# Patient Record
Sex: Female | Born: 1975
Health system: Southern US, Community
[De-identification: ages and names within clinical notes are randomized; demographics above are authoritative.]

## PROBLEM LIST (undated history)

## (undated) DIAGNOSIS — C801 Malignant (primary) neoplasm, unspecified: Secondary | ICD-10-CM

## (undated) DIAGNOSIS — I82409 Acute embolism and thrombosis of unspecified deep veins of unspecified lower extremity: Secondary | ICD-10-CM

## (undated) DIAGNOSIS — E119 Type 2 diabetes mellitus without complications: Secondary | ICD-10-CM

## (undated) DIAGNOSIS — M1990 Unspecified osteoarthritis, unspecified site: Secondary | ICD-10-CM

---

## 2014-01-14 DIAGNOSIS — G4733 Obstructive sleep apnea (adult) (pediatric): Secondary | ICD-10-CM | POA: Insufficient documentation

## 2014-01-14 DIAGNOSIS — F17200 Nicotine dependence, unspecified, uncomplicated: Secondary | ICD-10-CM | POA: Insufficient documentation

## 2014-01-14 DIAGNOSIS — F4310 Post-traumatic stress disorder, unspecified: Secondary | ICD-10-CM | POA: Insufficient documentation

## 2015-10-09 ENCOUNTER — Encounter (HOSPITAL_COMMUNITY): Payer: Self-pay

## 2015-10-09 ENCOUNTER — Emergency Department (HOSPITAL_COMMUNITY)
Admission: EM | Admit: 2015-10-09 | Discharge: 2015-10-10 | Disposition: A | Discharge status: Home / Self Care | Payer: Medicaid Other | Attending: Dermatology | Admitting: Dermatology

## 2015-10-09 DIAGNOSIS — E119 Type 2 diabetes mellitus without complications: Secondary | ICD-10-CM | POA: Insufficient documentation

## 2015-10-09 DIAGNOSIS — T407X5A Adverse effect of cannabis (derivatives), initial encounter: Secondary | ICD-10-CM | POA: Diagnosis not present

## 2015-10-09 DIAGNOSIS — F17200 Nicotine dependence, unspecified, uncomplicated: Secondary | ICD-10-CM | POA: Insufficient documentation

## 2015-10-09 DIAGNOSIS — Z5321 Procedure and treatment not carried out due to patient leaving prior to being seen by health care provider: Secondary | ICD-10-CM | POA: Diagnosis not present

## 2015-10-09 DIAGNOSIS — T407X1A Poisoning by cannabis (derivatives), accidental (unintentional), initial encounter: Secondary | ICD-10-CM | POA: Diagnosis present

## 2015-10-09 HISTORY — DX: Type 2 diabetes mellitus without complications: E11.9

## 2015-10-09 MED ORDER — ONDANSETRON 4 MG PO TBDP
4.0000 mg | ORAL_TABLET | Freq: Once | ORAL | Status: AC
  Administered 2015-10-09: 4 mg via ORAL
  Filled 2015-10-09: qty 1

## 2015-10-09 NOTE — ED Triage Notes (Signed)
Pt thinks she smoked some bad marijuana, she started shaking, vomiting and eyes were dilated, pt states that she felt really dizzy

## 2015-10-10 NOTE — ED Notes (Signed)
Pt not in the lobby again when ready for a room

## 2015-10-10 NOTE — ED Notes (Signed)
Pt not in the lobby when looking to place them in a room

## 2015-12-19 ENCOUNTER — Emergency Department (HOSPITAL_COMMUNITY)
Admission: EM | Admit: 2015-12-19 | Discharge: 2015-12-19 | Disposition: A | Discharge status: Home / Self Care | Payer: Medicaid Other | Attending: Emergency Medicine | Admitting: Emergency Medicine

## 2015-12-19 DIAGNOSIS — F17200 Nicotine dependence, unspecified, uncomplicated: Secondary | ICD-10-CM | POA: Insufficient documentation

## 2015-12-19 DIAGNOSIS — R739 Hyperglycemia, unspecified: Secondary | ICD-10-CM

## 2015-12-19 DIAGNOSIS — E1165 Type 2 diabetes mellitus with hyperglycemia: Secondary | ICD-10-CM | POA: Insufficient documentation

## 2015-12-19 DIAGNOSIS — Z7984 Long term (current) use of oral hypoglycemic drugs: Secondary | ICD-10-CM | POA: Insufficient documentation

## 2015-12-19 LAB — URINALYSIS, ROUTINE W REFLEX MICROSCOPIC
BILIRUBIN URINE: NEGATIVE
KETONES UR: NEGATIVE mg/dL
Leukocytes, UA: NEGATIVE
NITRITE: NEGATIVE
PH: 6 (ref 5.0–8.0)
Protein, ur: NEGATIVE mg/dL
Specific Gravity, Urine: 1.04 — ABNORMAL HIGH (ref 1.005–1.030)

## 2015-12-19 LAB — CBC WITH DIFFERENTIAL/PLATELET
BASOS ABS: 0 10*3/uL (ref 0.0–0.1)
Basophils Relative: 0 %
Eosinophils Absolute: 0.1 10*3/uL (ref 0.0–0.7)
Eosinophils Relative: 1 %
HEMATOCRIT: 34.8 % — AB (ref 36.0–46.0)
Hemoglobin: 12.1 g/dL (ref 12.0–15.0)
LYMPHS ABS: 3.9 10*3/uL (ref 0.7–4.0)
LYMPHS PCT: 37 %
MCH: 33.2 pg (ref 26.0–34.0)
MCHC: 34.8 g/dL (ref 30.0–36.0)
MCV: 95.6 fL (ref 78.0–100.0)
Monocytes Absolute: 0.7 10*3/uL (ref 0.1–1.0)
Monocytes Relative: 6 %
NEUTROS ABS: 6 10*3/uL (ref 1.7–7.7)
Neutrophils Relative %: 56 %
Platelets: 284 10*3/uL (ref 150–400)
RBC: 3.64 MIL/uL — AB (ref 3.87–5.11)
RDW: 12.3 % (ref 11.5–15.5)
WBC: 10.7 10*3/uL — AB (ref 4.0–10.5)

## 2015-12-19 LAB — COMPREHENSIVE METABOLIC PANEL
ALK PHOS: 39 U/L (ref 38–126)
ALT: 20 U/L (ref 14–54)
AST: 28 U/L (ref 15–41)
Albumin: 3.8 g/dL (ref 3.5–5.0)
Anion gap: 10 (ref 5–15)
BILIRUBIN TOTAL: 0.6 mg/dL (ref 0.3–1.2)
BUN: 9 mg/dL (ref 6–20)
CALCIUM: 8.9 mg/dL (ref 8.9–10.3)
CO2: 22 mmol/L (ref 22–32)
CREATININE: 0.58 mg/dL (ref 0.44–1.00)
Chloride: 102 mmol/L (ref 101–111)
GFR calc non Af Amer: >60 mL/min (ref >60)
Glucose, Bld: 414 mg/dL — ABNORMAL HIGH (ref 65–99)
Potassium: 3.4 mmol/L — ABNORMAL LOW (ref 3.5–5.1)
SODIUM: 134 mmol/L — AB (ref 135–145)
TOTAL PROTEIN: 6.9 g/dL (ref 6.5–8.1)

## 2015-12-19 LAB — BLOOD GAS, VENOUS
ACID-BASE DEFICIT: 0.2 mmol/L (ref 0.0–2.0)
BICARBONATE: 23.2 mmol/L (ref 20.0–28.0)
Drawn by: 280161
FIO2: 21
O2 Saturation: 98.8 %
PCO2 VEN: 35.8 mmHg — AB (ref 44.0–60.0)
PH VEN: 7.428 (ref 7.250–7.430)
PO2 VEN: 121 mmHg — AB (ref 32.0–45.0)
Patient temperature: 98.7

## 2015-12-19 LAB — I-STAT CHEM 8, ED
BUN: 7 mg/dL (ref 6–20)
CHLORIDE: 98 mmol/L — AB (ref 101–111)
CREATININE: 0.5 mg/dL (ref 0.44–1.00)
Calcium, Ion: 1.13 mmol/L — ABNORMAL LOW (ref 1.15–1.40)
GLUCOSE: 424 mg/dL — AB (ref 65–99)
HCT: 38 % (ref 36.0–46.0)
Hemoglobin: 12.9 g/dL (ref 12.0–15.0)
POTASSIUM: 3.4 mmol/L — AB (ref 3.5–5.1)
Sodium: 137 mmol/L (ref 135–145)
TCO2: 23 mmol/L (ref 0–100)

## 2015-12-19 LAB — CBG MONITORING, ED
GLUCOSE-CAPILLARY: 368 mg/dL — AB (ref 65–99)
GLUCOSE-CAPILLARY: 403 mg/dL — AB (ref 65–99)
GLUCOSE-CAPILLARY: 284 mg/dL — AB (ref 65–99)
Glucose-Capillary: 279 mg/dL — ABNORMAL HIGH (ref 65–99)

## 2015-12-19 LAB — URINE MICROSCOPIC-ADD ON
BACTERIA UA: NONE SEEN
WBC, UA: NONE SEEN WBC/hpf (ref 0–5)

## 2015-12-19 LAB — PREGNANCY, URINE: Preg Test, Ur: NEGATIVE

## 2015-12-19 MED ORDER — SODIUM CHLORIDE 0.9 % IV BOLUS (SEPSIS)
1000.0000 mL | Freq: Once | INTRAVENOUS | Status: AC
  Administered 2015-12-19: 1000 mL via INTRAVENOUS

## 2015-12-19 MED ORDER — KETOROLAC TROMETHAMINE 30 MG/ML IJ SOLN
INTRAMUSCULAR | Status: AC
Start: 2015-12-19 — End: 2015-12-19
  Administered 2015-12-19: 30 mg via INTRAVENOUS
  Filled 2015-12-19: qty 1

## 2015-12-19 MED ORDER — KETOROLAC TROMETHAMINE 30 MG/ML IJ SOLN
30.0000 mg | Freq: Once | INTRAMUSCULAR | Status: AC
  Administered 2015-12-19: 30 mg via INTRAVENOUS

## 2015-12-19 MED ORDER — INSULIN ASPART 100 UNIT/ML ~~LOC~~ SOLN
5.0000 [IU] | Freq: Once | SUBCUTANEOUS | Status: AC
  Administered 2015-12-19: 5 [IU] via INTRAVENOUS
  Filled 2015-12-19: qty 1

## 2015-12-19 MED ORDER — METFORMIN HCL 500 MG PO TABS: 1000.0000 mg | ORAL_TABLET | Freq: Two times a day (BID) | ORAL | 0 refills | Status: DC

## 2015-12-19 NOTE — Discharge Instructions (Signed)
Increase your dose of metformin from 500 mg twice a day to 1000 mg twice a day. Follow-up with your primary care doctor regarding your visit to the emergency department. Your primary doctor can recheck your sugar and hemoglobin A1c at this time. Be sure to drink plenty of water to prevent dehydration. Try to modify your diet and decrease the amount of fast foods or processed foods that you eat. Take Tylenol or ibuprofen for headache.

## 2015-12-19 NOTE — ED Provider Notes (Signed)
[facility] DEPT Provider Note   CSN: [id number] Arrival date & time: 12/19/15  0113  By signing my name below, I, Hansel Feinstein, attest that this documentation has been prepared under the direction and in the presence of [employer], PA-C. Electronically Signed: Hansel Feinstein, ED Scribe. 12/19/15. 1:37 AM.    History   Chief Complaint Chief Complaint  Patient presents with  . Hyperglycemia    HPI Queena Monrreal is a 40 y.o. female with h/o DM on Metformin brought in by ambulance who presents to the Emergency Department complaining of moderate HA onset at 11pm last night with associated "room-spinning" dizziness, difficulty breathing and "shakiness. Pt states she found her CBG to be 347 with onset of her symptoms. EMS reports CBG on their arrival was 460. Pt states her CBGs run in the 100s-120s at baseline. Pt also reports recent polydipsia, polyuria and increased thirst. She is not sure what her last A1C was. She reports that she has recently been under increased stress at home. Pt is currently followed by a PCP. She denies fever, cough, congestion, rhinorrhea.   The history is provided by the patient and the EMS personnel. No language interpreter was used.    Past Medical History:  Diagnosis Date  . Diabetes mellitus without complication ([place])     There are no active problems to display for this patient.   No past surgical history on file.  OB History    No data available       Home Medications    Prior to Admission medications   Medication Sig Start Date End Date Taking? Authorizing Provider  Cyanocobalamin (VITAMIN B 12 PO) Take 1 tablet by mouth daily.   Yes Historical Provider, MD  gabapentin (NEURONTIN) 100 MG capsule Take 100 mg by mouth 3 (three) times daily. 11/08/15 11/07/16 Yes Historical Provider, MD  lisinopril (PRINIVIL,ZESTRIL) 2.5 MG tablet Take 2.5 mg by mouth daily.   Yes Historical Provider, MD  loratadine (CLARITIN) 10 MG tablet Take 10 mg by mouth daily  as needed for allergies.   Yes Historical Provider, MD  Multiple Vitamin (MULTIVITAMIN WITH MINERALS) TABS tablet Take 1 tablet by mouth daily.   Yes Historical Provider, MD  metFORMIN (GLUCOPHAGE) 500 MG tablet Take 2 tablets (1,000 mg total) by mouth 2 (two) times daily. 12/19/15   Antonietta Breach, PA-C    Family History No family history on file.  Social History Social History  Substance Use Topics  . Smoking status: Current Every Day Smoker  . Smokeless tobacco: Never Used  . Alcohol use Yes     Allergies   Patient has no known allergies.   Review of Systems Review of Systems  Constitutional: Negative for fever.  HENT: Negative for congestion and rhinorrhea.   Respiratory: Negative for cough.   Endocrine: Positive for polydipsia and polyuria.       +hyperglycemia   Neurological: Positive for dizziness and headaches.  All other systems reviewed and are negative.    Physical Exam Updated Vital Signs BP 104/63 (BP Location: Right Arm)   Pulse 75   Temp 98.7 F (37.1 C) (Oral)   Resp 18   Ht 5' 2"  (1.575 m)   Wt 97.5 kg   LMP 12/18/2015   SpO2 99%   BMI 39.32 kg/m   Physical Exam  Constitutional: She is oriented to person, place, and time. She appears well-developed and well-nourished. No distress.  Nontoxic and in NAD  HENT:  Head: Normocephalic and atraumatic.  Mildly dry mm  Eyes: Conjunctivae and EOM are normal. No scleral icterus.  Neck: Normal range of motion.  Cardiovascular: Normal rate, regular rhythm and intact distal pulses.   Pulmonary/Chest: Effort normal. No respiratory distress. She has no wheezes. She has no rales.  Lungs CTAB  Abdominal: Soft. She exhibits no distension. There is no tenderness. There is no guarding.  Soft, obese, nontender abdomen.  Musculoskeletal: Normal range of motion.  Neurological: She is alert and oriented to person, place, and time. No cranial nerve deficit. She exhibits normal muscle tone. Coordination normal.  GCS  15. No focal neurologic deficits appreciated. Patient moves extremities without ataxia.  Skin: Skin is warm and dry. No rash noted. She is not diaphoretic. No erythema. No pallor.  Psychiatric: She has a normal mood and affect. Her behavior is normal.  Nursing note and vitals reviewed.    ED Treatments / Results   DIAGNOSTIC STUDIES: Oxygen Saturation is 97% on RA, normal by my interpretation.    COORDINATION OF CARE: 1:35 AM Discussed treatment plan with pt at bedside which includes lab work and pt agreed to plan.    Labs (all labs ordered are listed, but only abnormal results are displayed) Labs Reviewed  CBC WITH DIFFERENTIAL/PLATELET - Abnormal; Notable for the following:       Result Value   WBC 10.7 (*)    RBC 3.64 (*)    HCT 34.8 (*)    All other components within normal limits  BLOOD GAS, VENOUS - Abnormal; Notable for the following:    pCO2, Ven 35.8 (*)    pO2, Ven 121.0 (*)    All other components within normal limits  COMPREHENSIVE METABOLIC PANEL - Abnormal; Notable for the following:    Sodium 134 (*)    Potassium 3.4 (*)    Glucose, Bld 414 (*)    All other components within normal limits  URINALYSIS, ROUTINE W REFLEX MICROSCOPIC (NOT AT [facility]) - Abnormal; Notable for the following:    Specific Gravity, Urine 1.040 (*)    Glucose, UA >1000 (*)    Hgb urine dipstick MODERATE (*)    All other components within normal limits  URINE MICROSCOPIC-ADD ON - Abnormal; Notable for the following:    Squamous Epithelial / LPF 0-5 (*)    All other components within normal limits  CBG MONITORING, ED - Abnormal; Notable for the following:    Glucose-Capillary 368 (*)    All other components within normal limits  I-STAT CHEM 8, ED - Abnormal; Notable for the following:    Potassium 3.4 (*)    Chloride 98 (*)    Glucose, Bld 424 (*)    Calcium, Ion 1.13 (*)    All other components within normal limits  CBG MONITORING, ED - Abnormal; Notable for the following:     Glucose-Capillary 403 (*)    All other components within normal limits  CBG MONITORING, ED - Abnormal; Notable for the following:    Glucose-Capillary 279 (*)    All other components within normal limits  CBG MONITORING, ED - Abnormal; Notable for the following:    Glucose-Capillary 284 (*)    All other components within normal limits  PREGNANCY, URINE    EKG  EKG Interpretation None       Radiology No results found.  Procedures Procedures (including critical care time)  Medications Ordered in ED Medications  sodium chloride 0.9 % bolus 1,000 mL (0 mLs Intravenous Stopped 12/19/15 0230)  sodium chloride 0.9 % bolus 1,000 mL (1,000  mLs Intravenous New Bag/Given 12/19/15 0314)  insulin aspart (novoLOG) injection 5 Units (5 Units Intravenous Given 12/19/15 0317)  ketorolac (TORADOL) 30 MG/ML injection 30 mg (30 mg Intravenous Given 12/19/15 0313)     Initial Impression / Assessment and Plan / ED Course  I have reviewed the triage vital signs and the nursing notes.  Pertinent labs & imaging results that were available during my care of the patient were reviewed by me and considered in my medical decision making (see chart for details).  Clinical Course     3:35 AM Patient reassessed; texting on cell phone and in no distress or visible/audible discomfort. Patient states that she is feeling better. Will continue to hydrate to improve blood sugar. Labs without evidence of DKA.  4:56 AM Patient with improved blood sugar with insulin. Patient continues to endorse no complaints. She states that she feels back to normal. She is comfortable with discharge. Will increase metformin to 1054m BID. Patient advised to follow-up with her primary care doctor. Return precautions given. Patient discharged in stable condition with no unaddressed concerns.   Final Clinical Impressions(s) / ED Diagnoses   Final diagnoses:  Hyperglycemia    New Prescriptions Current Discharge Medication  List      I personally performed the services described in this documentation, which was scribed in my presence. The recorded information has been reviewed and is accurate.      KAntonietta Breach PA-C 12/19/15 [id number]   April Palumbo, MD 12/19/15 [phone]

## 2015-12-19 NOTE — ED Notes (Signed)
Bed: TR32 Expected date:  Expected time:  Means of arrival:  Comments: hyperglycemic

## 2015-12-19 NOTE — ED Triage Notes (Signed)
Per [facility]: pt reports hyperglycemia with headaches and jitteriness. Pt reports being under a lot of stress lately. Glucose 460. 18g left AC Pt has had 253cc Ns. 134/79 hr 84 94% room air. Pain 8/10. A&O.

## 2016-02-13 DIAGNOSIS — F410 Panic disorder [episodic paroxysmal anxiety] without agoraphobia: Secondary | ICD-10-CM | POA: Insufficient documentation

## 2016-02-13 DIAGNOSIS — F430 Acute stress reaction: Secondary | ICD-10-CM | POA: Insufficient documentation

## 2016-02-20 ENCOUNTER — Ambulatory Visit (INDEPENDENT_AMBULATORY_CARE_PROVIDER_SITE_OTHER): Payer: Medicaid Other | Admitting: Pulmonary Disease

## 2016-02-20 ENCOUNTER — Encounter: Payer: Self-pay | Admitting: Pulmonary Disease

## 2016-02-20 VITALS — BP 108/72 | HR 78 | Ht 62.0 in | Wt 242.2 lb

## 2016-02-20 DIAGNOSIS — G4710 Hypersomnia, unspecified: Secondary | ICD-10-CM

## 2016-02-20 DIAGNOSIS — G4733 Obstructive sleep apnea (adult) (pediatric): Secondary | ICD-10-CM

## 2016-02-20 DIAGNOSIS — Z720 Tobacco use: Secondary | ICD-10-CM | POA: Diagnosis not present

## 2016-02-20 NOTE — Assessment & Plan Note (Signed)
Patient was diagnosed with OSA in 2013.  She had snoring, gasping, choking, witnessed apneas, unrefreshed sleep, fatigue. She had a lab study, and was diagnosed with severe OSA. She is on her 3rd machine.  She lost her 1st machine, 2nd machine got broken. She got it from [place] at [facility]. CPAP is set at 10 cm water.   She feels better with cpap. More enery, less sleepiness. Less snoring. (-) witnessed apneas. (-) naps.  She is happy with cpap.   She moved to [place] 4 mos ago. No supplies x 6 months now. She has nasal pillows.   She is not on O2.   She is originally from [place], moved to [place] then [place] recently.   Plan :  We discussed about the diagnosis of Obstructive Sleep Apnea (OSA) and implications of untreated OSA. We discussed about CPAP and BiPaP as possible treatment options.    We will schedule the patient for a sleep study. Plan for a split-night sleep study. She claims she has been compliant. She is on her third machine. The third machine she got 6 months ago. She was using [place] from [place] and [employer]. We called [facility] office and they said that she was inactive. We tried calling [facility] office but unable to get in touch with. I discussed with the patient and told her we needed to start from scratch. Plan for a split-night sleep study. He is currently on 10 cm water. Likely with severe sleep apnea. Anticipate no issues with CPAP.   Patient was instructed to call the office if he/she has not heard back from the office 1-2 weeks after the sleep study.   Patient was instructed to call the office if he/she is having issues with the PAP device.   We discussed good sleep hygiene.   Patient was advised not to engage in activities requiring concentration and/or vigilance if he/she is sleepy.  Patient was advised not to drive if he/she is sleepy.

## 2016-02-20 NOTE — Assessment & Plan Note (Signed)
Smoking cessation  

## 2016-02-20 NOTE — Patient Instructions (Signed)
It was a pleasure taking care of you today!  We will schedule you to have a sleep study to determine if you have sleep apnea.     We will get a lab sleep study.  You will be scheduled to have a lab sleep study in 4-6 weeks.  Someone from the sleep lab will call you in 2-3 days to schedule the study with you.  They usually have cancellations every night so most likely, they will have openings for a lab sleep study next week or so.  We encourage you to do your sleep study then if possible. Please give us a call in a week is no one from the sleep lab calls you in 2-3 days.   If the sleep study is positive, we will order you a CPAP  machine.  Please call the office if you do NOT receive your machine in the next 1-2 weeks.   Please make sure you use your CPAP device everytime you sleep.  We will monitor the usage of your machine per your insurance requirement.  Your insurance company may take the machine from you if you are not using it regularly.   Please clean the mask, tubings, filter, water reservoir with soapy water every week.  Please use distilled water for the water reservoir.   Please call the office or your machine provider (DME company) if you are having issues with the device.   Return to clinic in 8-10 weeks with Dr. De Dios or NP    

## 2016-02-20 NOTE — Progress Notes (Signed)
Subjective:    Patient ID: Megan Riddle, female    DOB: 06/03/1975, 41 y.o.   MRN: [id number]  HPI   This is the case of Megan Riddle, 41 y.o. Female, who made this referral  regarding her OSA.    As you very well know, patient 15 PY smoking history, smokes 1/2 PPD, not been diagnosed with asthma or copd.   Patient was diagnosed with OSA in 2013.  She had snoring, gasping, choking, witnessed apneas, unrefreshed sleep, fatigue. She had a lab study, and was diagnosed with severe OSA. She is on her 3rd machine.  She lost her 1st machine, 2nd machine got broken. She got it from [place] at [facility]. CPAP is set at 10 cm water.   She feels better with cpap. More enery, less sleepiness. Less snoring. (-) witnessed apneas. (-) naps.  She is happy with cpap.   She moved to [place] 4 mos ago. No supplies x 6 months now. She has nasal pillows.   She is not on O2.   She is originally from [place], moved to [place] then [place] recently.   Review of Systems  Constitutional: Negative.  Negative for fever and unexpected weight change.  HENT: Negative for congestion, dental problem, ear pain, nosebleeds, postnasal drip, rhinorrhea, sinus pressure, sneezing, sore throat and trouble swallowing.   Eyes: Negative.  Negative for redness and itching.  Respiratory: Negative.  Negative for cough, chest tightness, shortness of breath and wheezing.   Cardiovascular: Negative.  Negative for palpitations and leg swelling.  Gastrointestinal: Negative.  Negative for nausea and vomiting.  Endocrine: Negative.   Genitourinary: Negative.  Negative for dysuria.  Musculoskeletal: Positive for joint swelling.  Skin: Negative.  Negative for rash.  Allergic/Immunologic: Positive for environmental allergies.  Neurological: Negative.  Negative for headaches.  Hematological: Negative.  Does not bruise/bleed easily.  Psychiatric/Behavioral: Negative.  Negative for dysphoric mood. The patient is not nervous/anxious.    Past  Medical History:  Diagnosis Date  . Diabetes mellitus without complication ([facility])    (-) CA, DVT (-) CVA, CAD  No family history on file.  Mother is deceased and had CAD.  Father has hepatitis C and DM  No past surgical history on file.  Social History   Social History  . Marital status: Married    Spouse name: N/A  . Number of children: N/A  . Years of education: N/A   Occupational History  . Not on file.   Social History Main Topics  . Smoking status: Current Every Day Smoker  . Smokeless tobacco: Never Used  . Alcohol use Yes  . Drug use: Yes    Types: Marijuana  . Sexual activity: Not on file   Other Topics Concern  . Not on file   Social History Narrative  . No narrative on file   Married, with 1 son. Does housekeeping at [employer]. Occasional ETOH.   No Known Allergies   Outpatient Medications Prior to Visit  Medication Sig Dispense Refill  . Cyanocobalamin (VITAMIN B 12 PO) Take 1 tablet by mouth daily.    Marland Kitchen gabapentin (NEURONTIN) 100 MG capsule Take 100 mg by mouth 3 (three) times daily.    Marland Kitchen lisinopril (PRINIVIL,ZESTRIL) 2.5 MG tablet Take 2.5 mg by mouth daily.    . metFORMIN (GLUCOPHAGE) 500 MG tablet Take 2 tablets (1,000 mg total) by mouth 2 (two) times daily. 60 tablet 0  . Multiple Vitamin (MULTIVITAMIN WITH MINERALS) TABS tablet Take 1 tablet by mouth daily.    Marland Kitchen  loratadine (CLARITIN) 10 MG tablet Take 10 mg by mouth daily as needed for allergies.     No facility-administered medications prior to visit.    No orders of the defined types were placed in this encounter.        Objective:   Physical Exam  Vitals:  Vitals:   02/20/16 1518  BP: 108/72  Pulse: 78  Weight: 242 lb 3.2 oz (109.9 kg)  Height: 5' 2"  (1.575 m)    Constitutional/General:  Pleasant, well-nourished, well-developed, not in any distress,  Comfortably seating.  Well kempt  Body mass index is 44.3 kg/m. Wt Readings from Last 3 Encounters:  02/20/16 242 lb 3.2 oz  (109.9 kg)  12/19/15 215 lb (97.5 kg)      HEENT: Pupils equal and reactive to light and accommodation. Anicteric sclerae. Normal nasal mucosa.   No oral  lesions,  mouth clear,  oropharynx clear, no postnasal drip. (-) Oral thrush. No dental caries.  Airway - Mallampati class IV  Neck: No masses. Midline trachea. No JVD, (-) LAD. (-) bruits appreciated.  Respiratory/Chest: Grossly normal chest. (-) deformity. (-) Accessory muscle use.  Symmetric expansion. (-) Tenderness on palpation.  Resonant on percussion.  Diminished BS on both lower lung zones. (-) wheezing, crackles, rhonchi (-) egophony  Cardiovascular: Regular rate and  rhythm, heart sounds normal, no murmur or gallops, no peripheral edema  Gastrointestinal:  Normal bowel sounds. Soft, non-tender. No hepatosplenomegaly.  (-) masses.   Musculoskeletal:  Normal muscle tone. Normal gait.   Extremities: Grossly normal. (-) clubbing, cyanosis.  (-) edema  Skin: (-) rash,lesions seen.   Neurological/Psychiatric : alert, oriented to time, place, person. Normal mood and affect          Assessment & Plan:  OSA (obstructive sleep apnea) Patient was diagnosed with OSA in 2013.  She had snoring, gasping, choking, witnessed apneas, unrefreshed sleep, fatigue. She had a lab study, and was diagnosed with severe OSA. She is on her 3rd machine.  She lost her 1st machine, 2nd machine got broken. She got it from [place] at [facility]. CPAP is set at 10 cm water.   She feels better with cpap. More enery, less sleepiness. Less snoring. (-) witnessed apneas. (-) naps.  She is happy with cpap.   She moved to [place] 4 mos ago. No supplies x 6 months now. She has nasal pillows.   She is not on O2.   She is originally from [place], moved to [place] then [place] recently.   Plan :  We discussed about the diagnosis of Obstructive Sleep Apnea (OSA) and implications of untreated OSA. We discussed about CPAP and BiPaP as possible  treatment options.    We will schedule the patient for a sleep study. Plan for a split-night sleep study. She claims she has been compliant. She is on her third machine. The third machine she got 6 months ago. She was using [place] from [place] and [employer]. We called [facility] office and they said that she was inactive. We tried calling [facility] office but unable to get in touch with. I discussed with the patient and told her we needed to start from scratch. Plan for a split-night sleep study. He is currently on 10 cm water. Likely with severe sleep apnea. Anticipate no issues with CPAP.   Patient was instructed to call the office if he/she has not heard back from the office 1-2 weeks after the sleep study.   Patient was instructed to  call the office if he/she is having issues with the PAP device.   We discussed good sleep hygiene.   Patient was advised not to engage in activities requiring concentration and/or vigilance if he/she is sleepy.  Patient was advised not to drive if he/she is sleepy.    Tobacco user Smoking cessation     Thank you very much for letting me participate in this patient's care. Please do not hesitate to give me a call if you have any questions or concerns regarding the treatment plan.   Patient will follow up with me in 8-10 weeks.     Monica Becton, MD 02/20/2016   3:46 PM Pulmonary and [place] Pager: [phone] Office: [phone], Fax: [phone]

## 2016-02-29 ENCOUNTER — Ambulatory Visit (HOSPITAL_BASED_OUTPATIENT_CLINIC_OR_DEPARTMENT_OTHER): Payer: Medicaid Other | Attending: Pulmonary Disease

## 2016-04-08 ENCOUNTER — Encounter (HOSPITAL_BASED_OUTPATIENT_CLINIC_OR_DEPARTMENT_OTHER): Payer: Medicaid Other

## 2016-04-16 ENCOUNTER — Ambulatory Visit: Payer: Medicaid Other | Admitting: Pulmonary Disease

## 2016-05-14 ENCOUNTER — Ambulatory Visit (HOSPITAL_COMMUNITY)
Admission: EM | Admit: 2016-05-14 | Discharge: 2016-05-14 | Disposition: A | Discharge status: Home / Self Care | Payer: Medicaid Other | Attending: Internal Medicine | Admitting: Internal Medicine

## 2016-05-14 ENCOUNTER — Encounter (HOSPITAL_COMMUNITY): Payer: Self-pay | Admitting: Emergency Medicine

## 2016-05-14 ENCOUNTER — Ambulatory Visit (INDEPENDENT_AMBULATORY_CARE_PROVIDER_SITE_OTHER): Payer: Medicaid Other

## 2016-05-14 DIAGNOSIS — S20222A Contusion of left back wall of thorax, initial encounter: Secondary | ICD-10-CM

## 2016-05-14 MED ORDER — DICLOFENAC SODIUM 75 MG PO TBEC: 75.0000 mg | DELAYED_RELEASE_TABLET | Freq: Two times a day (BID) | ORAL | 0 refills | Status: DC

## 2016-05-14 MED ORDER — METHOCARBAMOL 500 MG PO TABS: 500.0000 mg | ORAL_TABLET | Freq: Two times a day (BID) | ORAL | 0 refills | Status: DC

## 2016-05-14 NOTE — ED Triage Notes (Signed)
Patient fell from a 3 step ladder, fell backwards, patient was hanging curtains.  Patient has more pain on left back and breathing is particularly painful.

## 2016-05-14 NOTE — Discharge Instructions (Signed)
See your Physician for recheck in 1 week if pain persist °

## 2016-05-14 NOTE — ED Provider Notes (Signed)
CSN: [id number]     Arrival date & time 05/14/16  1417 History   None    Chief Complaint  Patient presents with  . Fall   (Consider location/radiation/quality/duration/timing/severity/associated sxs/prior Treatment) The history is provided by the patient. No language interpreter was used.  Fall  This is a new problem. The problem occurs constantly. The problem has been gradually worsening. Pertinent negatives include no shortness of breath. Nothing aggravates the symptoms. Nothing relieves the symptoms. She has tried nothing for the symptoms. The treatment provided no relief.   Pt reports she was hanging curtains and fell off of a 3 step ladder.  Pt hit the back of her left ribs. Past Medical History:  Diagnosis Date  . Diabetes mellitus without complication ([place])    History reviewed. No pertinent surgical history. No family history on file. Social History  Substance Use Topics  . Smoking status: Current Every Day Smoker  . Smokeless tobacco: Never Used  . Alcohol use Yes   OB History    No data available     Review of Systems  Respiratory: Negative for shortness of breath.   All other systems reviewed and are negative.   Allergies  Patient has no known allergies.  Home Medications   Prior to Admission medications   Medication Sig Start Date End Date Taking? Authorizing Provider  Cyanocobalamin (VITAMIN B 12 PO) Take 1 tablet by mouth daily.    Historical Provider, MD  gabapentin (NEURONTIN) 100 MG capsule Take 100 mg by mouth 3 (three) times daily. 11/08/15 11/07/16  Historical Provider, MD  lisinopril (PRINIVIL,ZESTRIL) 2.5 MG tablet Take 2.5 mg by mouth daily.    Historical Provider, MD  metFORMIN (GLUCOPHAGE) 500 MG tablet Take 2 tablets (1,000 mg total) by mouth 2 (two) times daily. 12/19/15   Antonietta Breach, PA-C  Multiple Vitamin (MULTIVITAMIN WITH MINERALS) TABS tablet Take 1 tablet by mouth daily.    Historical Provider, MD   Meds Ordered and Administered this  Visit  Medications - No data to display  BP (!) 108/59 (BP Location: Right Arm) Comment: notified rn  Pulse 81   Temp 98.6 F (37 C) (Oral)   Resp 20   LMP 05/14/2016   SpO2 99%  No data found.   Physical Exam  Constitutional: She appears well-developed and well-nourished.  HENT:  Head: Normocephalic.  Eyes: EOM are normal. Pupils are equal, round, and reactive to light.  Cardiovascular: Normal rate.   Pulmonary/Chest: Effort normal.  Tender left posterior ribs,  Pain with movement  Abdominal: Soft.  Neurological: She is alert.  Skin: Skin is warm.  Psychiatric: She has a normal mood and affect.  Nursing note and vitals reviewed.   Urgent Care Course     Procedures (including critical care time)  Labs Review Labs Reviewed - No data to display  Imaging Review Dg Chest 2 View  Result Date: 05/14/2016 CLINICAL DATA:  Left upper chest pain after fall. EXAM: CHEST  2 VIEW COMPARISON:  None. FINDINGS: The heart size and mediastinal contours are within normal limits. Both lungs are clear. The visualized skeletal structures are unremarkable. IMPRESSION: No active cardiopulmonary disease. Electronically Signed   By: Kerby Moors M.D.   On: 05/14/2016 16:06     Visual Acuity Review  Right Eye Distance:   Left Eye Distance:   Bilateral Distance:    Right Eye Near:   Left Eye Near:    Bilateral Near:         MDM no rib  fracture.     1. Contusion of left back wall of thorax, initial encounter    An After Visit Summary was printed and given to the patient. Meds ordered this encounter  Medications  . diclofenac (VOLTAREN) 75 MG EC tablet    Sig: Take 1 tablet (75 mg total) by mouth 2 (two) times daily.    Dispense:  20 tablet    Refill:  0    Order Specific Question:   Supervising Provider    Answer:   Sherlene Shams [[id number]]  . methocarbamol (ROBAXIN) 500 MG tablet    Sig: Take 1 tablet (500 mg total) by mouth 2 (two) times daily.    Dispense:  20 tablet     Refill:  0    Order Specific Question:   Supervising Provider    Answer:   Sherlene Shams [[id number]]      Fransico Meadow, PA-C 05/16/16 1236

## 2016-06-03 ENCOUNTER — Ambulatory Visit (HOSPITAL_BASED_OUTPATIENT_CLINIC_OR_DEPARTMENT_OTHER): Payer: Medicaid Other | Attending: Pulmonary Disease | Admitting: Pulmonary Disease

## 2016-06-03 DIAGNOSIS — G4733 Obstructive sleep apnea (adult) (pediatric): Secondary | ICD-10-CM | POA: Diagnosis not present

## 2016-06-03 DIAGNOSIS — G4710 Hypersomnia, unspecified: Secondary | ICD-10-CM | POA: Diagnosis present

## 2016-06-05 ENCOUNTER — Telehealth: Payer: Self-pay | Admitting: Pulmonary Disease

## 2016-06-05 DIAGNOSIS — G4733 Obstructive sleep apnea (adult) (pediatric): Secondary | ICD-10-CM

## 2016-06-05 DIAGNOSIS — G4710 Hypersomnia, unspecified: Secondary | ICD-10-CM

## 2016-06-05 NOTE — Telephone Encounter (Signed)
    Please call the pt and tell the pt the LAB SLEEP STUDY  showed OSA  Pt stops breathing 25    times an hour.   Please order auto CPAP therapy set at  6 cm H2O with a Medium size Resmed Nasal Pillow Mask AirFit P10 mask and heated humidification.  Patient will need a 1 month download.   Patient needs to be seen by me or any of the NPs/APPs  4-6 weeks after obtaining the cpap machine. Let me know if you receive this.   Thanks!   J. Shirl Harris, MD 06/05/2016, 3:11 PM

## 2016-06-05 NOTE — Procedures (Signed)
NAME: Megan Riddle DATE OF BIRTH:  11/20/1975 MEDICAL RECORD NUMBER [id number]  LOCATION: [facility]  PHYSICIAN: [place]  DATE OF STUDY: 06/03/2016  CLINICAL INFORMATION  Sleep Study Type: Split Night CPAP  Indication for sleep study: N/A  Epworth Sleepiness Score: 3   SLEEP STUDY TECHNIQUE  As per the AASM Manual for the Scoring of Sleep and Associated Events v2.3 (April 2016) with a hypopnea requiring 4% desaturations.  The channels recorded and monitored were frontal, central and occipital EEG, electrooculogram (EOG), submentalis EMG (chin), nasal and oral airflow, thoracic and abdominal wall motion, anterior tibialis EMG, snore microphone, electrocardiogram, and pulse oximetry. Continuous positive airway pressure (CPAP) was initiated when the patient met split night criteria and was titrated according to treat sleep-disordered breathing.  MEDICATIONS  Medications self-administered by patient taken the night of the study : N/A. Meds reviewed per chart review done.  RESPIRATORY PARAMETERS  Diagnostic Total AHI (/hr):  25.3 RDI (/hr): 32.3 OA Index (/hr):  1.7 CA Index (/hr):  0.0  REM AHI (/hr):  60.0 NREM AHI (/hr):  17.6 Supine AHI (/hr):  97.7 Non-supine AHI (/hr):  20.47  Min O2 Sat (%): 84.00 Mean O2 (%): 95.88 Time below 88% (min): 0.8    Titration Optimal Pressure (cm): 6 AHI at Optimal Pressure (/hr): 0.7 Min O2 at Optimal Pressure (%): 94.0  Supine % at Optimal (%): 22 Sleep % at Optimal (%): 80    SLEEP ARCHITECTURE  The recording time for the entire night was 397.4 minutes. During a baseline period of 179.5 minutes, the patient slept for 137.6 minutes in REM and nonREM, yielding a sleep efficiency of 76.6%. Sleep onset after lights out was 20.9 minutes with a REM latency of 71.0 minutes. The patient spent 26.96% of the night in stage N1 sleep, 54.87% in stage N2 sleep, 0.00% in stage N3 and 18.17% in REM.  During the titration period  of 210.6 minutes, the patient slept for 183.5 minutes in REM and nonREM, yielding a sleep efficiency of 87.1%. Sleep onset after CPAP initiation was 3.1 minutes with a REM latency of 42.0 minutes. The patient spent 11.44% of the night in stage N1 sleep, 47.96% in stage N2 sleep, 0.00% in stage N3 and 40.60% in REM.  CARDIAC DATA  The 2 lead EKG demonstrated sinus rhythm. The mean heart rate was 63.76 beats per minute. Other EKG findings include: None.   LEG MOVEMENT DATA  The total Periodic Limb Movements of Sleep (PLMS) were 13. The PLMS index was 2.40 .  IMPRESSIONS  1. Moderate obstructive sleep apnea occurred during the diagnostic portion of the study(AHI = 25.3/hour). An optimal PAP pressure was selected for this patient ( 6 cm of water) 2. No significant central sleep apnea occurred during the diagnostic portion of the study (CAI = 0.0/hour). 3. The patient had minimal or no oxygen desaturation during the diagnostic portion of the study (Min O2 = 84.00%) 4. The patient snored with Loud snoring volume during the diagnostic portion of the study. 5. No cardiac abnormalities were noted during this study. 6. Mild periodic limb movements of sleep occurred during the study. 7.  DIAGNOSIS  1. Obstructive Sleep Apnea (327.23 [G47.33 ICD-10]) 2.  RECOMMENDATIONS  1. Trial of CPAP therapy on 6 cm H2O with a Medium size Resmed Nasal Pillow Mask AirFit P10 mask and heated humidification. 2. Avoid alcohol, sedatives and other CNS depressants that may worsen sleep apnea and disrupt normal  sleep architecture. 3. Sleep hygiene should be reviewed to assess factors that may improve sleep quality. 4. Weight management and regular exercise should be initiated or continued. 5. Follow up in the office 4-6 weeks after obtaining cpap machine.  Monica Becton, MD 06/05/2016, 3:09 PM [facility] Pager [phone] After 3 pm or if no answer, call [phone]

## 2016-06-06 NOTE — Telephone Encounter (Signed)
lmomtcb x1 

## 2016-06-07 NOTE — Telephone Encounter (Signed)
Spoke to pt about her sleep study per AD. Pt understood and agreed to the order being placed. The order was placed and they had no further questions. Nothing further is needed  Also follow up appt was scheduled.

## 2016-07-09 ENCOUNTER — Encounter (HOSPITAL_COMMUNITY): Payer: Self-pay | Admitting: Emergency Medicine

## 2016-07-09 ENCOUNTER — Emergency Department (HOSPITAL_COMMUNITY)
Admission: EM | Admit: 2016-07-09 | Discharge: 2016-07-09 | Disposition: A | Discharge status: Home / Self Care | Payer: Medicaid Other | Attending: Emergency Medicine | Admitting: Emergency Medicine

## 2016-07-09 DIAGNOSIS — Z659 Problem related to unspecified psychosocial circumstances: Secondary | ICD-10-CM

## 2016-07-09 DIAGNOSIS — F430 Acute stress reaction: Secondary | ICD-10-CM | POA: Diagnosis not present

## 2016-07-09 DIAGNOSIS — E1165 Type 2 diabetes mellitus with hyperglycemia: Secondary | ICD-10-CM | POA: Insufficient documentation

## 2016-07-09 DIAGNOSIS — R55 Syncope and collapse: Secondary | ICD-10-CM

## 2016-07-09 DIAGNOSIS — F17200 Nicotine dependence, unspecified, uncomplicated: Secondary | ICD-10-CM | POA: Diagnosis not present

## 2016-07-09 DIAGNOSIS — Z79899 Other long term (current) drug therapy: Secondary | ICD-10-CM | POA: Diagnosis not present

## 2016-07-09 DIAGNOSIS — Z7984 Long term (current) use of oral hypoglycemic drugs: Secondary | ICD-10-CM | POA: Insufficient documentation

## 2016-07-09 DIAGNOSIS — Z853 Personal history of malignant neoplasm of breast: Secondary | ICD-10-CM | POA: Insufficient documentation

## 2016-07-09 DIAGNOSIS — R42 Dizziness and giddiness: Secondary | ICD-10-CM

## 2016-07-09 DIAGNOSIS — R739 Hyperglycemia, unspecified: Secondary | ICD-10-CM

## 2016-07-09 HISTORY — DX: Malignant (primary) neoplasm, unspecified: C80.1

## 2016-07-09 LAB — BASIC METABOLIC PANEL
Anion gap: 10 (ref 5–15)
BUN: 10 mg/dL (ref 6–20)
CO2: 22 mmol/L (ref 22–32)
Calcium: 9.8 mg/dL (ref 8.9–10.3)
Chloride: 99 mmol/L — ABNORMAL LOW (ref 101–111)
Creatinine, Ser: 0.71 mg/dL (ref 0.44–1.00)
GFR calc Af Amer: >60 mL/min (ref >60)
GLUCOSE: 281 mg/dL — AB (ref 65–99)
POTASSIUM: 3.4 mmol/L — AB (ref 3.5–5.1)
Sodium: 131 mmol/L — ABNORMAL LOW (ref 135–145)

## 2016-07-09 LAB — CBC
HEMATOCRIT: 37.6 % (ref 36.0–46.0)
Hemoglobin: 13 g/dL (ref 12.0–15.0)
MCH: 32.8 pg (ref 26.0–34.0)
MCHC: 34.6 g/dL (ref 30.0–36.0)
MCV: 94.9 fL (ref 78.0–100.0)
Platelets: 259 10*3/uL (ref 150–400)
RBC: 3.96 MIL/uL (ref 3.87–5.11)
RDW: 11.9 % (ref 11.5–15.5)
WBC: 11.2 10*3/uL — ABNORMAL HIGH (ref 4.0–10.5)

## 2016-07-09 LAB — CBG MONITORING, ED: Glucose-Capillary: 303 mg/dL — ABNORMAL HIGH (ref 65–99)

## 2016-07-09 NOTE — ED Notes (Signed)
Pt has no complaints at this time

## 2016-07-09 NOTE — ED Triage Notes (Signed)
Pt states she was at work and had a dizzy spell, had to sit on floor for about 64mnutes-- has had high CBG-- not taking any medicine for diabetes at present- pt is alert/oriented x 4, states "I just feel tired"

## 2016-07-09 NOTE — ED Notes (Signed)
Pt aware that urine sample is needed, but is unable to provide one at this time

## 2016-07-09 NOTE — ED Provider Notes (Signed)
[facility] DEPT Provider Note   CSN: [id number] Arrival date & time: 07/09/16  1203     History   Chief Complaint Chief Complaint  Patient presents with  . Hyperglycemia  . Dizziness    HPI Megan Riddle is a 41 y.o. female.  HPI Patient reports that what happened was she got in an altercation with her wife on the phone and got very upset and her blood pressure went up. She reports that she got lightheaded and sat down for a little while. She denies she had any chest pain or shortness of breath in association with this. Patient for she knows her blood sugars up, she reports that they have really good orange juice at work and she's been drinking it and although she knows she should not. She reports when she takes her metformin and follows her diet her blood sugars are fine. Reports she felt absolutely fine before the phone call. She feels back to normal now. She reports there are family stressors at this time due to their daughter being treated in the psychiatric unit at this time. Has been creating arguments and stress. Past Medical History:  Diagnosis Date  . Cancer ([place])    breast  . Diabetes mellitus without complication [facility])     Patient Active Problem List   Diagnosis Date Noted  . OSA (obstructive sleep apnea) 02/20/2016  . Tobacco user 02/20/2016    History reviewed. No pertinent surgical history.  OB History    No data available       Home Medications    Prior to Admission medications   Medication Sig Start Date End Date Taking? Authorizing Provider  Cyanocobalamin (VITAMIN B 12 PO) Take 1 tablet by mouth daily.    [provider]  diclofenac (VOLTAREN) 75 MG EC tablet Take 1 tablet (75 mg total) by mouth 2 (two) times daily. 05/14/16   Fransico Meadow, PA-C  gabapentin (NEURONTIN) 100 MG capsule Take 100 mg by mouth 3 (three) times daily. 11/08/15 11/07/16  [provider]  lisinopril (PRINIVIL,ZESTRIL) 2.5 MG tablet Take 2.5 mg by mouth  daily.    [provider]  metFORMIN (GLUCOPHAGE) 500 MG tablet Take 2 tablets (1,000 mg total) by mouth 2 (two) times daily. 12/19/15   Antonietta Breach, PA-C  methocarbamol (ROBAXIN) 500 MG tablet Take 1 tablet (500 mg total) by mouth 2 (two) times daily. 05/14/16   Fransico Meadow, PA-C  Multiple Vitamin (MULTIVITAMIN WITH MINERALS) TABS tablet Take 1 tablet by mouth daily.    [provider]    Family History No family history on file.  Social History Social History  Substance Use Topics  . Smoking status: Current Every Day Smoker  . Smokeless tobacco: Never Used  . Alcohol use Yes     Allergies   Patient has no known allergies.   Review of Systems Review of Systems 10 Systems reviewed and are negative for acute change except as noted in the HPI.   Physical Exam Updated Vital Signs BP (!) 104/57   Pulse 82   Temp 98.1 F (36.7 C) (Oral)   Ht 5' 1"  (1.549 m)   Wt 97.1 kg (214 lb)   LMP 06/09/2016   SpO2 100%   BMI 40.43 kg/m   Physical Exam  Constitutional: She is oriented to person, place, and time. She appears well-developed and well-nourished. No distress.  HENT:  Head: Normocephalic and atraumatic.  Mouth/Throat: Oropharynx is clear and moist.  Eyes: Conjunctivae and EOM are  normal. Pupils are equal, round, and reactive to light.  Neck: Neck supple.  Cardiovascular: Normal rate and regular rhythm.   No murmur heard. Pulmonary/Chest: Effort normal and breath sounds normal. No respiratory distress.  Abdominal: Soft. There is no tenderness.  Musculoskeletal: She exhibits no edema or tenderness.  Neurological: She is alert and oriented to person, place, and time. No cranial nerve deficit. She exhibits normal muscle tone. Coordination normal.  Skin: Skin is warm and dry.  Psychiatric: She has a normal mood and affect.  Nursing note and vitals reviewed.    ED Treatments / Results  Labs (all labs ordered are listed, but only abnormal results  are displayed) Labs Reviewed  BASIC METABOLIC PANEL - Abnormal; Notable for the following:       Result Value   Sodium 131 (*)    Potassium 3.4 (*)    Chloride 99 (*)    Glucose, Bld 281 (*)    All other components within normal limits  CBC - Abnormal; Notable for the following:    WBC 11.2 (*)    All other components within normal limits  CBG MONITORING, ED - Abnormal; Notable for the following:    Glucose-Capillary 303 (*)    All other components within normal limits  URINALYSIS, ROUTINE W REFLEX MICROSCOPIC    EKG  EKG Interpretation None       Radiology No results found.  Procedures Procedures (including critical care time)  Medications Ordered in ED Medications - No data to display   Initial Impression / Assessment and Plan / ED Course  I have reviewed the triage vital signs and the nursing notes.  Pertinent labs & imaging results that were available during my care of the patient were reviewed by me and considered in my medical decision making (see chart for details).     Final Clinical Impressions(s) / ED Diagnoses   Final diagnoses:  Dizziness  Near syncope  Hyperglycemia  Other social stressor   Patient clinically well and appearance. She is alert and nontoxic. She is asymptomatic. Patient for she has PCP follow-up and is usually compliant with her medications and diet. Plan will be for outpatient follow-up. At this time I feel she is stable for discharge. New Prescriptions New Prescriptions   No medications on file     Charlesetta Shanks, MD 07/09/16 1404

## 2016-07-10 ENCOUNTER — Emergency Department (HOSPITAL_COMMUNITY)
Admission: EM | Admit: 2016-07-10 | Discharge: 2016-07-10 | Disposition: A | Discharge status: Home / Self Care | Payer: Medicaid Other | Attending: Emergency Medicine | Admitting: Emergency Medicine

## 2016-07-10 ENCOUNTER — Emergency Department (HOSPITAL_COMMUNITY): Payer: Medicaid Other

## 2016-07-10 ENCOUNTER — Encounter (HOSPITAL_COMMUNITY): Payer: Self-pay | Admitting: *Deleted

## 2016-07-10 DIAGNOSIS — M542 Cervicalgia: Secondary | ICD-10-CM | POA: Diagnosis not present

## 2016-07-10 DIAGNOSIS — Z7984 Long term (current) use of oral hypoglycemic drugs: Secondary | ICD-10-CM | POA: Diagnosis not present

## 2016-07-10 DIAGNOSIS — R55 Syncope and collapse: Secondary | ICD-10-CM | POA: Diagnosis not present

## 2016-07-10 DIAGNOSIS — R0602 Shortness of breath: Secondary | ICD-10-CM | POA: Insufficient documentation

## 2016-07-10 DIAGNOSIS — F17200 Nicotine dependence, unspecified, uncomplicated: Secondary | ICD-10-CM | POA: Diagnosis not present

## 2016-07-10 DIAGNOSIS — E119 Type 2 diabetes mellitus without complications: Secondary | ICD-10-CM | POA: Diagnosis not present

## 2016-07-10 DIAGNOSIS — R079 Chest pain, unspecified: Secondary | ICD-10-CM | POA: Diagnosis not present

## 2016-07-10 DIAGNOSIS — R51 Headache: Secondary | ICD-10-CM | POA: Insufficient documentation

## 2016-07-10 DIAGNOSIS — Z79899 Other long term (current) drug therapy: Secondary | ICD-10-CM | POA: Insufficient documentation

## 2016-07-10 DIAGNOSIS — R519 Headache, unspecified: Secondary | ICD-10-CM

## 2016-07-10 DIAGNOSIS — Z853 Personal history of malignant neoplasm of breast: Secondary | ICD-10-CM | POA: Diagnosis not present

## 2016-07-10 LAB — URINALYSIS, ROUTINE W REFLEX MICROSCOPIC
Bilirubin Urine: NEGATIVE
Glucose, UA: NEGATIVE mg/dL
Ketones, ur: NEGATIVE mg/dL
Nitrite: NEGATIVE
PH: 6 (ref 5.0–8.0)
Protein, ur: NEGATIVE mg/dL
SPECIFIC GRAVITY, URINE: 1.008 (ref 1.005–1.030)

## 2016-07-10 LAB — BASIC METABOLIC PANEL
ANION GAP: 9 (ref 5–15)
BUN: 10 mg/dL (ref 6–20)
CALCIUM: 9.7 mg/dL (ref 8.9–10.3)
CHLORIDE: 102 mmol/L (ref 101–111)
CO2: 23 mmol/L (ref 22–32)
Creatinine, Ser: 0.57 mg/dL (ref 0.44–1.00)
GFR calc Af Amer: >60 mL/min (ref >60)
GFR calc non Af Amer: >60 mL/min (ref >60)
GLUCOSE: 265 mg/dL — AB (ref 65–99)
POTASSIUM: 3.8 mmol/L (ref 3.5–5.1)
Sodium: 134 mmol/L — ABNORMAL LOW (ref 135–145)

## 2016-07-10 LAB — CBC WITH DIFFERENTIAL/PLATELET
BASOS ABS: 0 10*3/uL (ref 0.0–0.1)
Basophils Relative: 0 %
Eosinophils Absolute: 0.1 10*3/uL (ref 0.0–0.7)
Eosinophils Relative: 1 %
HEMATOCRIT: 36.5 % (ref 36.0–46.0)
Hemoglobin: 12.9 g/dL (ref 12.0–15.0)
LYMPHS PCT: 36 %
Lymphs Abs: 3.8 10*3/uL (ref 0.7–4.0)
MCH: 32.7 pg (ref 26.0–34.0)
MCHC: 35.3 g/dL (ref 30.0–36.0)
MCV: 92.4 fL (ref 78.0–100.0)
MONO ABS: 0.8 10*3/uL (ref 0.1–1.0)
Monocytes Relative: 7 %
NEUTROS ABS: 5.7 10*3/uL (ref 1.7–7.7)
Neutrophils Relative %: 56 %
Platelets: 272 10*3/uL (ref 150–400)
RBC: 3.95 MIL/uL (ref 3.87–5.11)
RDW: 11.9 % (ref 11.5–15.5)
WBC: 10.3 10*3/uL (ref 4.0–10.5)

## 2016-07-10 LAB — CBG MONITORING, ED: Glucose-Capillary: 262 mg/dL — ABNORMAL HIGH (ref 65–99)

## 2016-07-10 LAB — I-STAT BETA HCG BLOOD, ED (MC, WL, AP ONLY)

## 2016-07-10 LAB — I-STAT TROPONIN, ED: TROPONIN I, POC: 0 ng/mL (ref 0.00–0.08)

## 2016-07-10 MED ORDER — METHOCARBAMOL 500 MG PO TABS: 500.0000 mg | ORAL_TABLET | Freq: Two times a day (BID) | ORAL | 0 refills | Status: DC

## 2016-07-10 MED ORDER — HYDROCODONE-ACETAMINOPHEN 5-325 MG PO TABS
1.0000 | ORAL_TABLET | Freq: Once | ORAL | Status: AC
  Administered 2016-07-10: 1 via ORAL
  Filled 2016-07-10: qty 1

## 2016-07-10 MED ORDER — IOPAMIDOL (ISOVUE-370) INJECTION 76%
100.0000 mL | Freq: Once | INTRAVENOUS | Status: AC | PRN
  Administered 2016-07-10: 100 mL via INTRAVENOUS

## 2016-07-10 MED ORDER — IOPAMIDOL (ISOVUE-370) INJECTION 76%
INTRAVENOUS | Status: AC
  Administered 2016-07-10: 100 mL via INTRAVENOUS
  Filled 2016-07-10: qty 100

## 2016-07-10 MED ORDER — NAPROXEN 500 MG PO TABS: 500.0000 mg | ORAL_TABLET | Freq: Two times a day (BID) | ORAL | 0 refills | Status: DC

## 2016-07-10 NOTE — ED Provider Notes (Signed)
Megan Riddle Provider Note   CSN: [id number] Arrival date & time: 07/10/16  0254     History   Chief Complaint Chief Complaint  Patient presents with  . Headache    HPI Megan Riddle is a 41 y.o. female.  Patient presents emergency department with chief complaint of syncopal episode and headache. She states that she became dizzy this afternoon while work and passed out. When she awakened she had a headache and left-sided neck pain that radiates into her chest. She also complains of some chest pain and shortness of breath. She denies any numbness, weakness, or tingling of her extremities. She denies any fevers chills. She has not taken anything for her symptoms. She states that her blood sugar has been running high, and she has not been taking her medications because she has been trying to improve her health through diet and exercise. She denies any other associated symptoms. There are no modifying factors.   The history is provided by the patient. No language interpreter was used.    Past Medical History:  Diagnosis Date  . Cancer ([place])    breast  . Diabetes mellitus without complication [facility])     Patient Active Problem List   Diagnosis Date Noted  . OSA (obstructive sleep apnea) 02/20/2016  . Tobacco user 02/20/2016    History reviewed. No pertinent surgical history.  OB History    No data available       Home Medications    Prior to Admission medications   Medication Sig Start Date End Date Taking? Authorizing Provider  Cyanocobalamin (VITAMIN B 12 PO) Take 1 tablet by mouth daily.    [provider]  diclofenac (VOLTAREN) 75 MG EC tablet Take 1 tablet (75 mg total) by mouth 2 (two) times daily. 05/14/16   Fransico Meadow, PA-C  gabapentin (NEURONTIN) 100 MG capsule Take 100 mg by mouth 3 (three) times daily. 11/08/15 11/07/16  [provider]  lisinopril (PRINIVIL,ZESTRIL) 2.5 MG tablet Take 2.5 mg by mouth daily.    [provider]  metFORMIN (GLUCOPHAGE) 500 MG tablet Take 2 tablets (1,000 mg total) by mouth 2 (two) times daily. 12/19/15   Antonietta Breach, PA-C  methocarbamol (ROBAXIN) 500 MG tablet Take 1 tablet (500 mg total) by mouth 2 (two) times daily. 05/14/16   Fransico Meadow, PA-C  Multiple Vitamin (MULTIVITAMIN WITH MINERALS) TABS tablet Take 1 tablet by mouth daily.    [provider]    Family History History reviewed. No pertinent family history.  Social History Social History  Substance Use Topics  . Smoking status: Current Every Day Smoker  . Smokeless tobacco: Never Used  . Alcohol use Yes     Allergies   Patient has no known allergies.   Review of Systems Review of Systems  All other systems reviewed and are negative.    Physical Exam Updated Vital Signs BP 122/65 (BP Location: Left Arm)   Pulse 67   Temp 97.1 F (36.2 C) (Oral)   Resp 18   Ht 5' 1"  (1.549 m)   Wt 97.1 kg (214 lb)   LMP 07/10/2016   SpO2 100%   BMI 40.43 kg/m   Physical Exam  Constitutional: She is oriented to person, place, and time. She appears well-developed and well-nourished.  HENT:  Head: Normocephalic and atraumatic.  Right Ear: External ear normal.  Left Ear: External ear normal.  Eyes: Conjunctivae and EOM are normal. Pupils are equal, round, and reactive to light.  Neck: Normal range of motion. Neck supple.  No pain with neck flexion, no meningismus  Cardiovascular: Normal rate, regular rhythm and normal heart sounds.  Exam reveals no gallop and no friction rub.   No murmur heard. Pulmonary/Chest: Effort normal and breath sounds normal. No respiratory distress. She has no wheezes. She has no rales. She exhibits no tenderness.  Abdominal: Soft. Bowel sounds are normal. She exhibits no distension and no mass. There is no tenderness. There is no rebound and no guarding.  Musculoskeletal: Normal range of motion. She exhibits no edema or tenderness.  Normal gait.  Neurological: She is alert  and oriented to person, place, and time. She has normal reflexes.  CN 3-12 intact, normal finger to nose, no pronator drift, sensation and strength intact bilaterally.  Skin: Skin is warm and dry.  Psychiatric: She has a normal mood and affect. Her behavior is normal. Judgment and thought content normal.  Nursing note and vitals reviewed.    ED Treatments / Results  Labs (all labs ordered are listed, but only abnormal results are displayed) Labs Reviewed  CBC WITH DIFFERENTIAL/PLATELET  BASIC METABOLIC PANEL  CBG MONITORING, ED  I-STAT TROPOININ, ED  I-STAT BETA HCG BLOOD, ED ([facility], [facility], [facility] ONLY)    EKG  EKG Interpretation None       Radiology No results found.  Procedures Procedures (including critical care time)  Medications Ordered in ED Medications - No data to display   Initial Impression / Assessment and Plan / ED Course  I have reviewed the triage vital signs and the nursing notes.  Pertinent labs & imaging results that were available during my care of the patient were reviewed by me and considered in my medical decision making (see chart for details).     Patient with syncopal episode, headache, and neck pain.  States that pain radiates into chest and has some SOB.  Will check labs and imaging.   Will check CT angio neck and head given that she had headache that radiates to her anterior neck and syncope.  If CTs are negative, I suspect that the patient can follow-up with her doctor.  I discussed the case with Dr. Regenia Skeeter, who agrees with the plan.   Patient signed out to [place], [place], who will continue care.  6:22 AM Patient reassessed and updated as to the plan.  She is feeling improved and agrees with the plan.  She is resting comfortably.  Final Clinical Impressions(s) / ED Diagnoses   Final diagnoses:  None    New Prescriptions New Prescriptions   No medications on file     Montine Circle, Hershal Coria 07/10/16 5176    Sherwood Gambler,  MD 07/10/16 [id number]

## 2016-07-10 NOTE — Discharge Instructions (Signed)
Naprosyn for pain and inflammation. Try robaxin for muscle spasms. Try heating pads. Follow up with family doctor if not improving. Return to ED if worsening symptoms

## 2016-07-10 NOTE — ED Triage Notes (Signed)
Patient is alert and oriented x4.  She is complaining of a headache running down her neck that started yesterday after she states that she passed out at work and has progressively gotten worse.  Patient adds that she has numbness and tingling in her face,  left arm but not in her right.  Currently she rates her pain 8 of 10 with no nausea.

## 2016-07-10 NOTE — ED Notes (Signed)
PA stated that pt could have juice, patients spouse was not happy that I gave pt juice they stated pt has diabetes that I knew better than to give her juice. Patient is currently drinking orange juice.

## 2016-07-10 NOTE — ED Provider Notes (Signed)
7:44 AM Pt signed out to me at shift change. Pt with headache, neck pain, syncopal episode yesterday. PT signed out waiting on CT angio head and neck.  CT resulted with some atheromatous changes otherwise unremarkable. He headache is feeling better however neck pain still persisting. She is in NAD. Ambulatory. Moving all extremities. Lungs are clear to auscultation. Regular HR and rhythm. Pt stable for dc home at this time. Discussed treatment with NSAIDs and will start on muscle relaxant for possible neck spasms. Follow up with PCP. Discussed results with pt. All questions answered.   Vitals:   07/10/16 0314 07/10/16 0445 07/10/16 0642 07/10/16 0725  BP: 122/65 115/76 107/66 116/69  Pulse: 67 77 79 (!) 49  Resp: 18 (!) 22 17 19   Temp: 97.1 F (36.2 C)     TempSrc: Oral     SpO2: 100% (!) 86% 100% 100%  Weight: 97.1 kg (214 lb)     Height: 5' 1"  (1.549 m)         Jeannett Senior, PA-C 07/10/16 0747    Drenda Freeze, MD 07/11/16 2014

## 2016-07-15 ENCOUNTER — Ambulatory Visit: Payer: Medicaid Other | Admitting: Acute Care

## 2016-08-07 ENCOUNTER — Telehealth: Payer: Self-pay | Admitting: Acute Care

## 2016-08-07 NOTE — Telephone Encounter (Signed)
Contacted patient and left voicemail to bring CPAP machine with the cord to 08/08/16 appointment with SG. Nothing further is needed.

## 2016-08-08 ENCOUNTER — Ambulatory Visit: Payer: Medicaid Other | Admitting: Acute Care

## 2016-08-15 ENCOUNTER — Ambulatory Visit (INDEPENDENT_AMBULATORY_CARE_PROVIDER_SITE_OTHER): Payer: Medicaid Other | Admitting: Acute Care

## 2016-08-15 ENCOUNTER — Encounter: Payer: Self-pay | Admitting: Acute Care

## 2016-08-15 DIAGNOSIS — F17210 Nicotine dependence, cigarettes, uncomplicated: Secondary | ICD-10-CM

## 2016-08-15 DIAGNOSIS — G4733 Obstructive sleep apnea (adult) (pediatric): Secondary | ICD-10-CM

## 2016-08-15 DIAGNOSIS — Z720 Tobacco use: Secondary | ICD-10-CM

## 2016-08-15 NOTE — Progress Notes (Signed)
History of Present Illness Megan Riddle is a 41 y.o. female current smoker ( 15 pack year smoker) with OSA.( AHI: 25). She is followed by Dr. Corrie Dandy   08/15/2016 Follow Up OSA: Pt. Presents for follow up. She has moderate OSA. She was stared on CPAP therapy  set at  10 cm H2O with a Medium size Resmed Nasal Pillow Mask AirFit P10 mask and heated humidification.She is here for her 1 month down Load. [facility] is refusing to give the patient her supplies other than nasal prongs.. We do not have a Sim card or a down load. The patient states she took the machine to [place] but was never provided a [employer].She states she needs needs tubing and other supplies.  She states she is wearing her CPAP every night. ( No down Load to confirm) She states she is benefiting from therapy as she has less daytime sleepiness.She feels she is more functional and awake.She denies any problems with her machine or therapy. She just needs tubing and access to supplies. She is worried Medicare will take the machine because there is not  a download to confirm use.She denies fever, chest pain, orthopnea or hemoptysis. She denies leg pain.She has lost 90 pounds over the last 2 months through diet.  Test Results: No Down Load available.  CBC Latest Ref Rng & Units 07/10/2016 07/09/2016 12/19/2015  WBC 4.0 - 10.5 K/uL 10.3 11.2(H) -  Hemoglobin 12.0 - 15.0 g/dL 12.9 13.0 12.9  Hematocrit 36.0 - 46.0 % 36.5 37.6 38.0  Platelets 150 - 400 K/uL 272 259 -    BMP Latest Ref Rng & Units 07/10/2016 07/09/2016 12/19/2015  Glucose 65 - 99 mg/dL 265(H) 281(H) 424(H)  BUN 6 - 20 mg/dL 10 10 7   Creatinine 0.44 - 1.00 mg/dL 0.57 0.71 0.50  Sodium 135 - 145 mmol/L 134(L) 131(L) 137  Potassium 3.5 - 5.1 mmol/L 3.8 3.4(L) 3.4(L)  Chloride 101 - 111 mmol/L 102 99(L) 98(L)  CO2 22 - 32 mmol/L 23 22 -  Calcium 8.9 - 10.3 mg/dL 9.7 9.8 -      Past medical hx Past Medical History:  Diagnosis Date  . Cancer ([place])    breast  .  Diabetes mellitus without complication [facility])      Social History  Substance Use Topics  . Smoking status: Current Every Day Smoker  . Smokeless tobacco: Never Used  . Alcohol use Yes    Tobacco Cessation: Ready to quit: No Counseling given: Yes  I have spent 3 minutes counseling patient on smoking cessation this visit. Provided with "Be Stronger Than Your Excuses" resource card.  Past surgical hx, Family hx, Social hx all reviewed.  Current Outpatient Prescriptions on File Prior to Visit  Medication Sig  . Cyanocobalamin (VITAMIN B 12 PO) Take 1 tablet by mouth daily.  . diclofenac (VOLTAREN) 75 MG EC tablet Take 1 tablet (75 mg total) by mouth 2 (two) times daily.  Marland Kitchen gabapentin (NEURONTIN) 100 MG capsule Take 100 mg by mouth 3 (three) times daily.  Marland Kitchen lisinopril (PRINIVIL,ZESTRIL) 2.5 MG tablet Take 2.5 mg by mouth daily.  . metFORMIN (GLUCOPHAGE) 500 MG tablet Take 2 tablets (1,000 mg total) by mouth 2 (two) times daily.  . methocarbamol (ROBAXIN) 500 MG tablet Take 1 tablet (500 mg total) by mouth 2 (two) times daily.  . Multiple Vitamin (MULTIVITAMIN WITH MINERALS) TABS tablet Take 1 tablet by mouth daily.  . naproxen (NAPROSYN) 500 MG tablet Take 1 tablet (500 mg  total) by mouth 2 (two) times daily.   No current facility-administered medications on file prior to visit.      No Known Allergies  Review Of Systems:  Constitutional:   No  weight loss, night sweats,  Fevers, chills, fatigue, or  lassitude.  HEENT:   No headaches,  Difficulty swallowing,  Tooth/dental problems, or  Sore throat,                No sneezing, itching, ear ache, nasal congestion, post nasal drip,   CV:  No chest pain,  Orthopnea, PND, swelling in lower extremities, anasarca, dizziness, palpitations, syncope.   GI  No heartburn, indigestion, abdominal pain, nausea, vomiting, diarrhea, change in bowel habits, loss of appetite, bloody stools.   Resp: No shortness of breath with exertion or at  rest.  No excess mucus, no productive cough,  No non-productive cough,  No coughing up of blood.  No change in color of mucus.  No wheezing.  No chest wall deformity  Skin: no rash or lesions.  GU: no dysuria, change in color of urine, no urgency or frequency.  No flank pain, no hematuria   MS:  No joint pain or swelling.  No decreased range of motion.  No back pain.  Psych:  No change in mood or affect. No depression or anxiety.  No memory loss.   Vital Signs BP 118/82 (BP Location: Left Arm, Patient Position: Sitting, Cuff Size: Normal)   Pulse 82   Ht 5' 1"  (1.549 m)   Wt 214 lb (97.1 kg)   SpO2 98%   BMI 40.43 kg/m    Physical Exam:  General- No distress,  A&Ox3 ENT: No sinus tenderness, TM clear, pale nasal mucosa, no oral exudate,no post nasal drip, no LAN Cardiac: S1, S2, regular rate and rhythm, no murmur Chest: No wheeze/ rales/ dullness; no accessory muscle use, no nasal flaring, no sternal retractions Abd.: Soft Non-tender, Obese Ext: No clubbing cyanosis, edema Neuro:  normal strength Skin: No rashes, warm and dry Psych: normal mood and behavior   Assessment/Plan  OSA (obstructive sleep apnea) No Sim Card/ No Down Load Per patient : She is compliant with Machine Plan: We will place an order for [facility] to continue current settings and provide a download . Continue on CPAP at bedtime. You appear to be benefiting from the treatment Goal is to wear for at least 4-6 hours each night for maximal clinical benefit. Continue to work on weight loss, as the link between excess weight  and sleep apnea is well established.  Clean mask, tubing, filter and reservoir weekly with soapy water Do not drive if sleepy. Follow up with Dr. Elsworth Soho in 6 months. Please contact office for sooner follow up if symptoms do not improve or worsen or seek emergency care    Tobacco user States she smokes 1/2 PPD Provided : Be stronger than your excuses card for smoking cessation. I  have spent 3 minutes counseling patient on smoking cessation this visit.    Magdalen Spatz, NP 08/15/2016  10:37 AM

## 2016-08-15 NOTE — Assessment & Plan Note (Signed)
States she smokes 1/2 PPD Provided : Be stronger than your excuses card for smoking cessation. I have spent 3 minutes counseling patient on smoking cessation this visit.

## 2016-08-15 NOTE — Progress Notes (Signed)
I have reviewed and agree with assessment/plan.  Chesley Mires, MD [facility] Pulmonary/Critical Care 08/15/2016, 1:24 PM Pager:  [phone]

## 2016-08-15 NOTE — Patient Instructions (Addendum)
It is nice to meet you today. We will place an order for [facility] to continue current settings and provide a download . Continue on CPAP at bedtime. You appear to be benefiting from the treatment Goal is to wear for at least 4-6 hours each night for maximal clinical benefit. Continue to work on weight loss, as the link between excess weight  and sleep apnea is well established.  Clean mask, tubing, filter and reservoir weekly with soapy water Do not drive if sleepy. Follow up with Dr. Elsworth Soho in 6 months. Please contact office for sooner follow up if symptoms do not improve or worsen or seek emergency care

## 2016-08-15 NOTE — Assessment & Plan Note (Signed)
No Sim Card/ No Down Load Per patient : She is compliant with Machine Plan: We will place an order for [facility] to continue current settings and provide a download . Continue on CPAP at bedtime. You appear to be benefiting from the treatment Goal is to wear for at least 4-6 hours each night for maximal clinical benefit. Continue to work on weight loss, as the link between excess weight  and sleep apnea is well established.  Clean mask, tubing, filter and reservoir weekly with soapy water Do not drive if sleepy. Follow up with Dr. Elsworth Soho in 6 months. Please contact office for sooner follow up if symptoms do not improve or worsen or seek emergency care

## 2016-08-19 ENCOUNTER — Other Ambulatory Visit: Payer: Self-pay

## 2016-08-19 DIAGNOSIS — G4733 Obstructive sleep apnea (adult) (pediatric): Secondary | ICD-10-CM

## 2016-08-22 DIAGNOSIS — E1169 Type 2 diabetes mellitus with other specified complication: Secondary | ICD-10-CM | POA: Insufficient documentation

## 2016-09-25 ENCOUNTER — Telehealth: Payer: Self-pay | Admitting: Acute Care

## 2016-09-25 NOTE — Telephone Encounter (Signed)
Spoke with patient-aware that we will contact [facility] on Thursday 09-26-16 AM as they are closed now.

## 2016-09-26 NOTE — Telephone Encounter (Signed)
If this is for a download the only data available is 30 days worth. Order placed in July was for a 90-day download.   Requesting 30 day download be faxed to Dr Elsworth Soho.  Jonni Sanger is going to put a reminder to send a 90-day download as well.   Pt aware to take her machine by [facility] to that they can obtain a download.   Will send to [place] to keep an eye out for this download.

## 2016-10-04 NOTE — Telephone Encounter (Signed)
SG please advise if you have received and reviewed the DL. Thanks

## 2016-10-04 NOTE — Telephone Encounter (Signed)
LMTCB for [place] at [place]

## 2016-10-04 NOTE — Telephone Encounter (Signed)
I have not received the down Load. Thanks

## 2016-10-07 NOTE — Telephone Encounter (Signed)
atttempted to call Jonni Sanger with [facility] but was placed on hold.

## 2016-10-08 NOTE — Telephone Encounter (Signed)
Attempted to call Megan Riddle at [place] and was placed on hold again and no one ever came to the phone.  Will try back later.

## 2016-10-09 NOTE — Telephone Encounter (Signed)
Called and spoke with Bethanne Ginger from [place] and the DL ----Jonni Sanger placed a note that there was nothing in the machine to DL and the pt has an appt on 10/2 with Jonni Sanger to get the DL done.  Will sign off for now.

## 2016-12-05 ENCOUNTER — Encounter (HOSPITAL_COMMUNITY): Payer: Self-pay | Admitting: Emergency Medicine

## 2016-12-05 ENCOUNTER — Ambulatory Visit (HOSPITAL_COMMUNITY)
Admission: EM | Admit: 2016-12-05 | Discharge: 2016-12-05 | Disposition: A | Discharge status: Home / Self Care | Payer: Medicaid Other | Attending: Family Medicine | Admitting: Family Medicine

## 2016-12-05 DIAGNOSIS — R221 Localized swelling, mass and lump, neck: Secondary | ICD-10-CM | POA: Diagnosis not present

## 2016-12-05 MED ORDER — AMOXICILLIN-POT CLAVULANATE 875-125 MG PO TABS: 1.0000 | ORAL_TABLET | Freq: Two times a day (BID) | ORAL | 0 refills | Status: DC

## 2016-12-05 NOTE — ED Provider Notes (Signed)
[place]    CSN: [id number] Arrival date & time: 12/05/16  1607     History   Chief Complaint Chief Complaint  Patient presents with  . Cyst    HPI Megan Riddle is a 41 y.o. female.   41 year old female noticed a relatively small lump under the left chin a couple days ago. In the past 48 hours it is increased in size and is located in the submental soft tissue at midline. She states it does not hurt but is tender. Nothing makes it worse except for touch, nothing makes it better. Denies associated symptoms. Denies recent dry mouth, toothache, tongue pain or oral pharyngeal pain.      Past Medical History:  Diagnosis Date  . Cancer ([place])    breast  . Diabetes mellitus without complication [facility])     Patient Active Problem List   Diagnosis Date Noted  . OSA (obstructive sleep apnea) 02/20/2016  . Tobacco user 02/20/2016    No past surgical history on file.  OB History    No data available       Home Medications    Prior to Admission medications   Medication Sig Start Date End Date Taking? Authorizing Provider  amoxicillin-clavulanate (AUGMENTIN) 875-125 MG tablet Take 1 tablet every 12 (twelve) hours by mouth. 12/05/16   Janne Napoleon, NP  Cyanocobalamin (VITAMIN B 12 PO) Take 1 tablet by mouth daily.    [provider]  gabapentin (NEURONTIN) 100 MG capsule Take 100 mg by mouth 3 (three) times daily. 11/08/15 11/07/16  [provider]  lisinopril (PRINIVIL,ZESTRIL) 2.5 MG tablet Take 2.5 mg by mouth daily.    [provider]  metFORMIN (GLUCOPHAGE) 500 MG tablet Take 2 tablets (1,000 mg total) by mouth 2 (two) times daily. 12/19/15   Antonietta Breach, PA-C  methocarbamol (ROBAXIN) 500 MG tablet Take 1 tablet (500 mg total) by mouth 2 (two) times daily. 07/10/16   Kirichenko, Lahoma Rocker, PA-C  Multiple Vitamin (MULTIVITAMIN WITH MINERALS) TABS tablet Take 1 tablet by mouth daily.    [provider]  naproxen (NAPROSYN)  500 MG tablet Take 1 tablet (500 mg total) by mouth 2 (two) times daily. 07/10/16   Jeannett Senior, PA-C    Family History No family history on file.  Social History Social History   Tobacco Use  . Smoking status: Current Every Day Smoker  . Smokeless tobacco: Never Used  Substance Use Topics  . Alcohol use: Yes  . Drug use: Yes    Types: Marijuana     Allergies   Patient has no known allergies.   Review of Systems Review of Systems  Constitutional: Negative.   HENT: Negative for trouble swallowing.        As per history of present illness, otherwise negative  Eyes: Negative.   Respiratory: Negative.   Cardiovascular: Negative.   Skin: Negative for wound.  Neurological: Negative.   All other systems reviewed and are negative.    Physical Exam Triage Vital Signs ED Triage Vitals  Enc Vitals Group     BP 12/05/16 1634 (!) 108/57     Pulse Rate 12/05/16 1634 62     Resp 12/05/16 1634 (!) 22     Temp 12/05/16 1634 98.9 F (37.2 C)     Temp Source 12/05/16 1634 Oral     SpO2 12/05/16 1634 100 %     Weight --      Height --      Head Circumference --  Peak Flow --      Pain Score 12/05/16 1632 3     Pain Loc --      Pain Edu? --      Excl. in [place]? --    No data found.  Updated Vital Signs BP (!) 108/57 (BP Location: Left Arm) Comment (BP Location): large cuff  Pulse 62   Temp 98.9 F (37.2 C) (Oral)   Resp (!) 22   LMP 12/03/2016   SpO2 100%   Visual Acuity Right Eye Distance:   Left Eye Distance:   Bilateral Distance:    Right Eye Near:   Left Eye Near:    Bilateral Near:     Physical Exam  Constitutional: She is oriented to person, place, and time. She appears well-developed and well-nourished. No distress.  HENT:  Head: Normocephalic and atraumatic.  Mouth/Throat: Oropharynx is clear and moist.  There is a firm, and mobile nodular mass in the submental soft tissue. Mildly tender. No overlying erythema or other skin changes. No  intraoral lesions. Oropharynx is clear. No dental tenderness. No swelling sublingually.  Eyes: EOM are normal.  Neck: Normal range of motion. Neck supple.  No posterior cervical nodes or masses.  Cardiovascular: Normal rate.  Pulmonary/Chest: Effort normal.  Musculoskeletal: She exhibits no edema.  Neurological: She is alert and oriented to person, place, and time.  Skin: Skin is warm and dry.  Psychiatric: She has a normal mood and affect.  Nursing note and vitals reviewed.    UC Treatments / Results  Labs (all labs ordered are listed, but only abnormal results are displayed) Labs Reviewed - No data to display  EKG  EKG Interpretation None       Radiology No results found.  Procedures Procedures (including critical care time)  Medications Ordered in UC Medications - No data to display   Initial Impression / Assessment and Plan / UC Course  I have reviewed the triage vital signs and the nursing notes.  Pertinent labs & imaging results that were available during my care of the patient were reviewed by me and considered in my medical decision making (see chart for details).    Take the antibiotic as directed. Apply warm moist compresses. Read the written instructions provided for the possible diagnoses for the mass under your chin. If you are getting worse rather than better in the next 48-72 hours either see your doctor or go to the emergency department.    Final Clinical Impressions(s) / UC Diagnoses   Final diagnoses:  Mass of submental region    ED Discharge Orders        Ordered    amoxicillin-clavulanate (AUGMENTIN) 875-125 MG tablet  Every 12 hours     12/05/16 1702       Controlled Substance Prescriptions [place] Controlled Substance Registry consulted? Not Applicable   Janne Napoleon, NP 12/05/16 [phone]

## 2016-12-05 NOTE — ED Triage Notes (Signed)
Knot under chin noticed 2 days ago and is painful to palpation.  Reports pain around to the back of her neck.

## 2016-12-05 NOTE — Discharge Instructions (Signed)
Take the antibiotic as directed. Apply warm moist compresses. Read the written instructions provided for the possible diagnoses for the mass under your chin. If you are getting worse rather than better in the next 48-72 hours either see your doctor or go to the emergency department.

## 2017-01-13 ENCOUNTER — Ambulatory Visit: Payer: Medicaid Other | Admitting: Pulmonary Disease

## 2017-01-13 ENCOUNTER — Encounter: Payer: Self-pay | Admitting: Pulmonary Disease

## 2017-01-13 DIAGNOSIS — G4733 Obstructive sleep apnea (adult) (pediatric): Secondary | ICD-10-CM

## 2017-01-13 DIAGNOSIS — Z720 Tobacco use: Secondary | ICD-10-CM | POA: Diagnosis not present

## 2017-01-13 DIAGNOSIS — Z23 Encounter for immunization: Secondary | ICD-10-CM | POA: Diagnosis not present

## 2017-01-13 NOTE — Assessment & Plan Note (Signed)
CPAP is certainly helped her subjectively.   Weight loss encouraged, compliance with goal of at least 4-6 hrs every night is the expectation. Advised against medications with sedative side effects Cautioned against driving when sleepy - understanding that sleepiness will vary on a day to day basis

## 2017-01-13 NOTE — Assessment & Plan Note (Signed)
Smoking cessation was again encouraged. She has quit one time before when she had breast cancer and seems to be motivated to quit again.  Her husband is trying to quit the Chantix and she is going to try nicotine gum.

## 2017-01-13 NOTE — Patient Instructions (Signed)
Continue CPAP settings. We will review download report and make adjustments if needed Good luck with quitting smoking.  Call us as needed

## 2017-01-13 NOTE — Progress Notes (Signed)
   Subjective:    Patient ID: Megan Riddle, female    DOB: 10-May-1975, 41 y.o.   MRN: [id number]  HPI  41 year old smoker with moderate OSA.  She was started on a new machine 05/2016 after sleep study with [place]. She was initially diagnosed 3 years ago but did not tolerate CPAP . This time she has been more compliant and reports good improvement in her daytime somnolence and snoring. 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  She also takes care of her stepson with special needs. She often finds herself dozing sitting up in snoring is been noted by her husband when she sleeps without her machine.  However she reports compliance.  No silk pillows has worked well for [place], denies mask or pressure issues or significant dryness.  She continues to smoke half pack per day.  Chantix has not worked for her, her husband smokes too and is trying to quit with Chantix.  She is going to try nicotine gum  Significant tests/ events reviewed  05/2016 wt 215 lbs  - RDI 32/h  >> CPAP 6 cm   Review of Systems neg for any significant sore throat, dysphagia, itching, sneezing, nasal congestion or excess/ purulent secretions, fever, chills, sweats, unintended wt loss, pleuritic or exertional cp, hempoptysis, orthopnea pnd or change in chronic leg swelling.  Also denies presyncope, palpitations, heartburn, abdominal pain, nausea, vomiting, diarrhea or change in bowel or urinary habits, dysuria,hematuria, rash, arthralgias, visual complaints, headache, numbness weakness or ataxia.     Objective:   Physical Exam  Gen. Pleasant, obese, in no distress ENT - no lesions, no post nasal drip Neck: No JVD, no thyromegaly, no carotid bruits Lungs: no use of accessory muscles, no dullness to percussion, decreased without rales or rhonchi  Cardiovascular: Rhythm regular, heart sounds  normal, no murmurs or gallops, no peripheral  edema Musculoskeletal: No deformities, no cyanosis or clubbing , no tremors       Assessment & Plan:

## 2017-02-03 ENCOUNTER — Emergency Department (HOSPITAL_COMMUNITY)
Admission: EM | Admit: 2017-02-03 | Discharge: 2017-02-03 | Disposition: A | Discharge status: Home / Self Care | Payer: Medicaid Other | Attending: Emergency Medicine | Admitting: Emergency Medicine

## 2017-02-03 ENCOUNTER — Encounter (HOSPITAL_COMMUNITY): Payer: Self-pay | Admitting: Emergency Medicine

## 2017-02-03 DIAGNOSIS — E119 Type 2 diabetes mellitus without complications: Secondary | ICD-10-CM | POA: Diagnosis not present

## 2017-02-03 DIAGNOSIS — Z79899 Other long term (current) drug therapy: Secondary | ICD-10-CM | POA: Diagnosis not present

## 2017-02-03 DIAGNOSIS — F410 Panic disorder [episodic paroxysmal anxiety] without agoraphobia: Secondary | ICD-10-CM | POA: Insufficient documentation

## 2017-02-03 DIAGNOSIS — F17200 Nicotine dependence, unspecified, uncomplicated: Secondary | ICD-10-CM | POA: Insufficient documentation

## 2017-02-03 DIAGNOSIS — R0602 Shortness of breath: Secondary | ICD-10-CM | POA: Diagnosis present

## 2017-02-03 DIAGNOSIS — Z7984 Long term (current) use of oral hypoglycemic drugs: Secondary | ICD-10-CM | POA: Diagnosis not present

## 2017-02-03 HISTORY — DX: Unspecified osteoarthritis, unspecified site: M19.90

## 2017-02-03 LAB — BASIC METABOLIC PANEL
Anion gap: 7 (ref 5–15)
BUN: 12 mg/dL (ref 6–20)
CHLORIDE: 106 mmol/L (ref 101–111)
CO2: 24 mmol/L (ref 22–32)
Calcium: 9.5 mg/dL (ref 8.9–10.3)
Creatinine, Ser: 0.56 mg/dL (ref 0.44–1.00)
GFR calc non Af Amer: >60 mL/min (ref >60)
Glucose, Bld: 136 mg/dL — ABNORMAL HIGH (ref 65–99)
Potassium: 3.9 mmol/L (ref 3.5–5.1)
SODIUM: 137 mmol/L (ref 135–145)
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
## 2017-02-03 NOTE — Discharge Instructions (Signed)
Follow up with your doctor.  Return here as needed 

## 2017-03-05 ENCOUNTER — Ambulatory Visit: Payer: Self-pay | Admitting: *Deleted

## 2017-03-22 ENCOUNTER — Other Ambulatory Visit: Payer: Self-pay

## 2017-03-22 ENCOUNTER — Emergency Department (HOSPITAL_COMMUNITY): Payer: Medicaid Other

## 2017-03-22 ENCOUNTER — Encounter (HOSPITAL_COMMUNITY): Payer: Self-pay | Admitting: Emergency Medicine

## 2017-03-22 ENCOUNTER — Emergency Department (HOSPITAL_COMMUNITY)
Admission: EM | Admit: 2017-03-22 | Discharge: 2017-03-22 | Disposition: A | Discharge status: Home / Self Care | Payer: Medicaid Other | Attending: Emergency Medicine | Admitting: Emergency Medicine

## 2017-03-22 DIAGNOSIS — Z5321 Procedure and treatment not carried out due to patient leaving prior to being seen by health care provider: Secondary | ICD-10-CM | POA: Insufficient documentation

## 2017-03-22 DIAGNOSIS — R419 Unspecified symptoms and signs involving cognitive functions and awareness: Secondary | ICD-10-CM | POA: Diagnosis not present

## 2017-03-22 DIAGNOSIS — R0602 Shortness of breath: Secondary | ICD-10-CM | POA: Insufficient documentation

## 2017-03-22 LAB — BASIC METABOLIC PANEL
Anion gap: 16 — ABNORMAL HIGH (ref 5–15)
BUN: 9 mg/dL (ref 6–20)
CALCIUM: 9.5 mg/dL (ref 8.9–10.3)
CO2: 17 mmol/L — AB (ref 22–32)
CREATININE: 0.6 mg/dL (ref 0.44–1.00)
Chloride: 99 mmol/L — ABNORMAL LOW (ref 101–111)
GFR calc Af Amer: >60 mL/min (ref >60)
GFR calc non Af Amer: >60 mL/min (ref >60)
GLUCOSE: 281 mg/dL — AB (ref 65–99)
Potassium: 3.6 mmol/L (ref 3.5–5.1)
Sodium: 132 mmol/L — ABNORMAL LOW (ref 135–145)

## 2017-03-22 LAB — CBC
HCT: 38.3 % (ref 36.0–46.0)
Hemoglobin: 13.2 g/dL (ref 12.0–15.0)
MCH: 33 pg (ref 26.0–34.0)
MCHC: 34.5 g/dL (ref 30.0–36.0)
MCV: 95.8 fL (ref 78.0–100.0)
Platelets: 297 10*3/uL (ref 150–400)
RBC: 4 MIL/uL (ref 3.87–5.11)
RDW: 12.2 % (ref 11.5–15.5)
WBC: 15.7 10*3/uL — ABNORMAL HIGH (ref 4.0–10.5)

## 2017-03-22 LAB — I-STAT TROPONIN, ED: TROPONIN I, POC: 0 ng/mL (ref 0.00–0.08)

## 2017-03-22 LAB — I-STAT BETA HCG BLOOD, ED (MC, WL, AP ONLY): I-stat hCG, quantitative: <5 m[IU]/mL (ref <5)
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
## 2017-03-22 NOTE — ED Notes (Signed)
Called Pt to reassess. No answer X3

## 2017-04-25 ENCOUNTER — Other Ambulatory Visit: Payer: Self-pay | Admitting: Family Medicine

## 2017-04-25 DIAGNOSIS — Z139 Encounter for screening, unspecified: Secondary | ICD-10-CM

## 2017-05-19 ENCOUNTER — Ambulatory Visit
Admission: RE | Admit: 2017-05-19 | Discharge: 2017-05-19 | Disposition: A | Discharge status: Home / Self Care | Payer: Medicaid Other | Source: Ambulatory Visit | Attending: Family Medicine | Admitting: Family Medicine

## 2017-05-19 DIAGNOSIS — Z139 Encounter for screening, unspecified: Secondary | ICD-10-CM

## 2017-05-20 ENCOUNTER — Other Ambulatory Visit: Payer: Self-pay | Admitting: Family Medicine

## 2017-05-20 DIAGNOSIS — Z9889 Other specified postprocedural states: Secondary | ICD-10-CM

## 2017-05-24 ENCOUNTER — Emergency Department (HOSPITAL_COMMUNITY): Payer: Medicaid Other

## 2017-05-24 ENCOUNTER — Emergency Department (HOSPITAL_COMMUNITY)
Admission: EM | Admit: 2017-05-24 | Discharge: 2017-05-24 | Disposition: A | Discharge status: Home / Self Care | Payer: Medicaid Other | Attending: Emergency Medicine | Admitting: Emergency Medicine

## 2017-05-24 ENCOUNTER — Other Ambulatory Visit: Payer: Self-pay

## 2017-05-24 ENCOUNTER — Encounter (HOSPITAL_COMMUNITY): Payer: Self-pay | Admitting: *Deleted

## 2017-05-24 DIAGNOSIS — R0789 Other chest pain: Secondary | ICD-10-CM | POA: Diagnosis not present

## 2017-05-24 DIAGNOSIS — M25552 Pain in left hip: Secondary | ICD-10-CM | POA: Diagnosis not present

## 2017-05-24 DIAGNOSIS — F17200 Nicotine dependence, unspecified, uncomplicated: Secondary | ICD-10-CM | POA: Diagnosis not present

## 2017-05-24 DIAGNOSIS — Y999 Unspecified external cause status: Secondary | ICD-10-CM | POA: Diagnosis not present

## 2017-05-24 DIAGNOSIS — Y929 Unspecified place or not applicable: Secondary | ICD-10-CM | POA: Insufficient documentation

## 2017-05-24 DIAGNOSIS — E119 Type 2 diabetes mellitus without complications: Secondary | ICD-10-CM | POA: Insufficient documentation

## 2017-05-24 DIAGNOSIS — Z79899 Other long term (current) drug therapy: Secondary | ICD-10-CM | POA: Diagnosis not present

## 2017-05-24 DIAGNOSIS — Z853 Personal history of malignant neoplasm of breast: Secondary | ICD-10-CM | POA: Diagnosis not present

## 2017-05-24 DIAGNOSIS — Z7984 Long term (current) use of oral hypoglycemic drugs: Secondary | ICD-10-CM | POA: Insufficient documentation

## 2017-05-24 DIAGNOSIS — R52 Pain, unspecified: Secondary | ICD-10-CM

## 2017-05-24 DIAGNOSIS — Y9389 Activity, other specified: Secondary | ICD-10-CM | POA: Diagnosis not present

## 2017-05-24 DIAGNOSIS — M79645 Pain in left finger(s): Secondary | ICD-10-CM

## 2017-05-24 LAB — POC URINE PREG, ED: PREG TEST UR: NEGATIVE

## 2017-05-24 MED ORDER — ACETAMINOPHEN 500 MG PO TABS
1000.0000 mg | ORAL_TABLET | Freq: Once | ORAL | Status: AC
  Administered 2017-05-24: 1000 mg via ORAL
  Filled 2017-05-24: qty 2

## 2017-05-24 MED ORDER — LIDOCAINE-PRILOCAINE 2.5-2.5 % EX CREA: TOPICAL_CREAM | Freq: Once | CUTANEOUS | Status: DC

## 2017-05-24 NOTE — Discharge Instructions (Signed)
Alternate 600 mg of ibuprofen and [id number] mg of Tylenol every 3 hours as needed for pain. Do not exceed 4000 mg of Tylenol daily.  Take ibuprofen with food to avoid upset stomach issues.  Drink plenty of water and get plenty of rest.  Apply ice or heat to areas of soreness for comfort, which ever feels best.  Follow-up with your primary care physician for reevaluation as needed.  Return to the emergency department immediately for any concerning signs or symptoms develop such as severe headaches, vomiting, persistent swelling, redness, or loss of pulses.

## 2017-05-24 NOTE — ED Notes (Signed)
Unable to get rings off left hand due to swelling. Ice placed on finger and will use emla cream. Pt also refusing any future x-rays. PA Mina made aware.
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
## 2017-07-14 ENCOUNTER — Encounter (HOSPITAL_COMMUNITY): Payer: Self-pay | Admitting: Emergency Medicine

## 2017-07-14 ENCOUNTER — Ambulatory Visit (HOSPITAL_COMMUNITY)
Admission: EM | Admit: 2017-07-14 | Discharge: 2017-07-14 | Disposition: A | Discharge status: Home / Self Care | Payer: Medicaid Other | Attending: Urgent Care | Admitting: Urgent Care

## 2017-07-14 DIAGNOSIS — R739 Hyperglycemia, unspecified: Secondary | ICD-10-CM | POA: Diagnosis not present

## 2017-07-14 DIAGNOSIS — R110 Nausea: Secondary | ICD-10-CM

## 2017-07-14 DIAGNOSIS — E1165 Type 2 diabetes mellitus with hyperglycemia: Secondary | ICD-10-CM

## 2017-07-14 DIAGNOSIS — R42 Dizziness and giddiness: Secondary | ICD-10-CM

## 2017-07-14 DIAGNOSIS — IMO0001 Reserved for inherently not codable concepts without codable children: Secondary | ICD-10-CM

## 2017-07-14 LAB — POCT I-STAT, CHEM 8
BUN: 12 mg/dL (ref 6–20)
Calcium, Ion: 1.26 mmol/L (ref 1.15–1.40)
Chloride: 100 mmol/L — ABNORMAL LOW (ref 101–111)
Creatinine, Ser: 0.4 mg/dL — ABNORMAL LOW (ref 0.44–1.00)
Glucose, Bld: 219 mg/dL — ABNORMAL HIGH (ref 65–99)
HCT: 40 % (ref 36.0–46.0)
Hemoglobin: 13.6 g/dL (ref 12.0–15.0)
Potassium: 4.5 mmol/L (ref 3.5–5.1)
Sodium: 135 mmol/L (ref 135–145)
TCO2: 23 mmol/L (ref 22–32)

## 2017-07-14 LAB — POCT URINALYSIS DIP (DEVICE)
BILIRUBIN URINE: NEGATIVE
Glucose, UA: NEGATIVE mg/dL
Hgb urine dipstick: NEGATIVE
Ketones, ur: NEGATIVE mg/dL
LEUKOCYTES UA: NEGATIVE
Nitrite: NEGATIVE
Protein, ur: NEGATIVE mg/dL
Specific Gravity, Urine: 1.02 (ref 1.005–1.030)
Urobilinogen, UA: 0.2 mg/dL (ref 0.0–1.0)
pH: 5.5 (ref 5.0–8.0)

## 2017-07-14 MED ORDER — GLIPIZIDE 5 MG PO TABS: 5.0000 mg | ORAL_TABLET | Freq: Every day | ORAL | 0 refills | Status: DC

## 2017-07-14 NOTE — ED Provider Notes (Signed)
MRN: [id number] DOB: Oct 23, 1975  Subjective:   Megan Riddle is a 42 y.o. female presenting for 3-day history of malaise, dizziness, nausea without vomiting.  Patient is currently taking 1,000 milligrams of metformin twice daily.  She had a prescription for glipizide but states that she ran out of this medication.  Denies headache, chest pain, confusion, shortness of breath, belly pain, hematuria.  She is a current smoker, drinks alcohol on occasion.  No current facility-administered medications for this encounter.   Current Outpatient Medications:  .  amoxicillin-clavulanate (AUGMENTIN) 875-125 MG tablet, Take 1 tablet every 12 (twelve) hours by mouth. (Patient not taking: Reported on 01/13/2017), Disp: 14 tablet, Rfl: 0 .  Cyanocobalamin (VITAMIN B 12 PO), Take 1 tablet by mouth daily., Disp: , Rfl:  .  gabapentin (NEURONTIN) 100 MG capsule, Take 100 mg by mouth 3 (three) times daily., Disp: , Rfl:  .  metFORMIN (GLUCOPHAGE) 500 MG tablet, Take 2 tablets (1,000 mg total) by mouth 2 (two) times daily., Disp: 60 tablet, Rfl: 0 .  methocarbamol (ROBAXIN) 500 MG tablet, Take 1 tablet (500 mg total) by mouth 2 (two) times daily. (Patient not taking: Reported on 01/13/2017), Disp: 20 tablet, Rfl: 0 .  Multiple Vitamin (MULTIVITAMIN WITH MINERALS) TABS tablet, Take 1 tablet by mouth daily., Disp: , Rfl:  .  naproxen (NAPROSYN) 500 MG tablet, Take 1 tablet (500 mg total) by mouth 2 (two) times daily. (Patient not taking: Reported on 01/13/2017), Disp: 30 tablet, Rfl: 0   No Known Allergies  Past Medical History:  Diagnosis Date  . Arthritis   . Cancer ([place])    breast  . Diabetes mellitus without complication ([place])      History reviewed. No pertinent surgical history.  Objective:   Vitals: BP (!) 109/58 (BP Location: Left Arm)   Pulse 85   Temp 99.2 F (37.3 C) (Oral)   Resp 18   LMP 03/02/2017   SpO2 99%   Physical Exam  Constitutional: She is oriented to person, place, and  time. She appears well-developed and well-nourished.  HENT:  Mouth/Throat: Oropharynx is clear and moist.  Eyes: Right eye exhibits no discharge. Left eye exhibits no discharge. No scleral icterus.  Cardiovascular: Normal rate, regular rhythm and intact distal pulses. Exam reveals no gallop and no friction rub.  No murmur heard. Pulmonary/Chest: No respiratory distress. She has no wheezes. She has no rales.  Neurological: She is alert and oriented to person, place, and time.  Skin: Skin is warm and dry.  Psychiatric: She has a normal mood and affect.   Results for orders placed or performed during the hospital encounter of 07/14/17 (from the past 24 hour(s))  I-STAT, chem 8     Status: Abnormal   Collection Time: 07/14/17  1:15 PM  Result Value Ref Range   Sodium 135 135 - 145 mmol/L   Potassium 4.5 3.5 - 5.1 mmol/L   Chloride 100 (L) 101 - 111 mmol/L   BUN 12 6 - 20 mg/dL   Creatinine, Ser 0.40 (L) 0.44 - 1.00 mg/dL   Glucose, Bld 219 (H) 65 - 99 mg/dL   Calcium, Ion 1.26 1.15 - 1.40 mmol/L   TCO2 23 22 - 32 mmol/L   Hemoglobin 13.6 12.0 - 15.0 g/dL   HCT 40.0 36.0 - 46.0 %  POCT urinalysis dip (device)     Status: None   Collection Time: 07/14/17  1:29 PM  Result Value Ref Range   Glucose, UA NEGATIVE NEGATIVE mg/dL  Bilirubin Urine NEGATIVE NEGATIVE   Ketones, ur NEGATIVE NEGATIVE mg/dL   Specific Gravity, Urine 1.020 1.005 - 1.030   Hgb urine dipstick NEGATIVE NEGATIVE   pH 5.5 5.0 - 8.0   Protein, ur NEGATIVE NEGATIVE mg/dL   Urobilinogen, UA 0.2 0.0 - 1.0 mg/dL   Nitrite NEGATIVE NEGATIVE   Leukocytes, UA NEGATIVE NEGATIVE   Assessment and Plan :   Hyperglycemia  Uncontrolled type 2 diabetes mellitus without complication (HCC)  Dizziness  Nausea without vomiting  Patient's vitals and physical exam findings reassuring.  I refilled her glipizide 5 mg and patient is to follow-up with her PCP for continued management of her diabetes.  For now counseled that she  hydrate adequately each day and avoid heavy carbohydrates.  ER and return to clinic precautions were reviewed.   Jaynee Eagles, [place] 07/14/17 1445

## 2017-07-14 NOTE — ED Triage Notes (Signed)
Pt sts not feeling well and blood sugar running higher than usual

## 2017-07-14 NOTE — Discharge Instructions (Addendum)
Hydrate well with at least 2 liters (1 gallon) of water daily. Check back with your PCP for continued management of your diabetes.

## 2017-08-28 ENCOUNTER — Ambulatory Visit (HOSPITAL_COMMUNITY)
Admission: EM | Admit: 2017-08-28 | Discharge: 2017-08-28 | Disposition: A | Discharge status: Home / Self Care | Payer: Medicaid Other | Attending: Family Medicine | Admitting: Family Medicine

## 2017-08-28 ENCOUNTER — Encounter (HOSPITAL_COMMUNITY): Payer: Self-pay

## 2017-08-28 DIAGNOSIS — M5442 Lumbago with sciatica, left side: Secondary | ICD-10-CM | POA: Diagnosis not present

## 2017-08-28 DIAGNOSIS — M545 Low back pain: Secondary | ICD-10-CM

## 2017-08-28 LAB — POCT URINALYSIS DIP (DEVICE)
Bilirubin Urine: NEGATIVE
Glucose, UA: NEGATIVE mg/dL
Hgb urine dipstick: NEGATIVE
Ketones, ur: NEGATIVE mg/dL
Leukocytes, UA: NEGATIVE
NITRITE: NEGATIVE
PH: 7 (ref 5.0–8.0)
Protein, ur: NEGATIVE mg/dL
Specific Gravity, Urine: 1.02 (ref 1.005–1.030)
UROBILINOGEN UA: 0.2 mg/dL (ref 0.0–1.0)

## 2017-08-28 LAB — POCT I-STAT, CHEM 8
BUN: 5 mg/dL — AB (ref 6–20)
CALCIUM ION: 1.26 mmol/L (ref 1.15–1.40)
CREATININE: 0.6 mg/dL (ref 0.44–1.00)
Chloride: 102 mmol/L (ref 98–111)
Glucose, Bld: 201 mg/dL — ABNORMAL HIGH (ref 70–99)
HCT: 42 % (ref 36.0–46.0)
Hemoglobin: 14.3 g/dL (ref 12.0–15.0)
Potassium: 4.2 mmol/L (ref 3.5–5.1)
Sodium: 139 mmol/L (ref 135–145)
TCO2: 25 mmol/L (ref 22–32)

## 2017-08-28 MED ORDER — CYCLOBENZAPRINE HCL 5 MG PO TABS: 5.0000 mg | ORAL_TABLET | Freq: Every day | ORAL | 0 refills | Status: DC

## 2017-08-28 MED ORDER — HYDROCODONE-ACETAMINOPHEN 5-325 MG PO TABS
ORAL_TABLET | ORAL | Status: AC
  Filled 2017-08-28: qty 1

## 2017-08-28 MED ORDER — KETOROLAC TROMETHAMINE 60 MG/2ML IM SOLN
INTRAMUSCULAR | Status: AC
  Filled 2017-08-28: qty 2

## 2017-08-28 MED ORDER — NAPROXEN 500 MG PO TABS: 500.0000 mg | ORAL_TABLET | Freq: Two times a day (BID) | ORAL | 0 refills | Status: DC

## 2017-08-28 MED ORDER — HYDROCODONE-ACETAMINOPHEN 5-325 MG PO TABS
1.0000 | ORAL_TABLET | Freq: Once | ORAL | Status: AC
  Administered 2017-08-28: 1 via ORAL

## 2017-08-28 MED ORDER — KETOROLAC TROMETHAMINE 60 MG/2ML IM SOLN
60.0000 mg | Freq: Once | INTRAMUSCULAR | Status: AC
  Administered 2017-08-28: 60 mg via INTRAMUSCULAR

## 2017-08-28 MED ORDER — KETOROLAC TROMETHAMINE 60 MG/2ML IM SOLN
INTRAMUSCULAR | Status: AC
Start: 2017-08-28 — End: ?
  Filled 2017-08-28: qty 2

## 2017-08-28 NOTE — Discharge Instructions (Addendum)
Light and regular activity as tolerated.  Ice application at night may be helpful.  Heat during the day to prevent muscle spasms.  May use muscle relaxer at night as needed. May cause drowsiness. Please do not take if driving or drinking alcohol.   Naproxen twice a day, take with food. May take first dose tonight.  If symptoms do not improve in the next week to return to follow up with PCP.   If worsening of pain, urinary symptoms, numbness or tingling to the legs, weakness, incontinence of urine or stool develop please return or go to Er.

## 2017-08-28 NOTE — ED Triage Notes (Signed)
Pt presents with back pain in the lower left quadrant area

## 2017-08-28 NOTE — ED Provider Notes (Signed)
[facility]    CSN: [id number] Arrival date & time: 08/28/17  1123     History   Chief Complaint Chief Complaint  Patient presents with  . Back Pain    lower left quadrant    HPI Megan Riddle is a 42 y.o. female.   Megan Riddle presents with complaints of left lower back pain. Started 4 days ago suddenly. No known injury. States has had similar in the past and it was gas. Pain is worse with movements. Worsening. Severe. Sharp. Has had urinary frequency and noted blood in her urine yesterday. No nausea or vomiting. Has not taken any medications for pain. No weakness to extremities, no numbness or tingling. No loss of bladder or bowel function. Hx of dm, arthritis. States takes metformin "as needed."      ROS per HPI.      Past Medical History:  Diagnosis Date  . Arthritis   . Cancer ([place])    breast  . Diabetes mellitus without complication [facility])     Patient Active Problem List   Diagnosis Date Noted  . OSA (obstructive sleep apnea) 02/20/2016  . Tobacco user 02/20/2016    History reviewed. No pertinent surgical history.  OB History   None      Home Medications    Prior to Admission medications   Medication Sig Start Date End Date Taking? Authorizing Provider  amoxicillin-clavulanate (AUGMENTIN) 875-125 MG tablet Take 1 tablet every 12 (twelve) hours by mouth. Patient not taking: Reported on 01/13/2017 12/05/16   Janne Napoleon, NP  Cyanocobalamin (VITAMIN B 12 PO) Take 1 tablet by mouth daily.    [provider]  cyclobenzaprine (FLEXERIL) 5 MG tablet Take 1 tablet (5 mg total) by mouth at bedtime. 08/28/17   Zigmund Gottron, NP  gabapentin (NEURONTIN) 100 MG capsule Take 100 mg by mouth 3 (three) times daily. 11/08/15 11/07/16  [provider]  glipiZIDE (GLUCOTROL) 5 MG tablet Take 1 tablet (5 mg total) by mouth daily before breakfast. 07/14/17   Jaynee Eagles, PA-C  metFORMIN (GLUCOPHAGE) 500 MG tablet Take 2 tablets (1,000 mg total)  by mouth 2 (two) times daily. 12/19/15   Antonietta Breach, PA-C  methocarbamol (ROBAXIN) 500 MG tablet Take 1 tablet (500 mg total) by mouth 2 (two) times daily. Patient not taking: Reported on 01/13/2017 07/10/16   Jeannett Senior, PA-C  Multiple Vitamin (MULTIVITAMIN WITH MINERALS) TABS tablet Take 1 tablet by mouth daily.    [provider]  naproxen (NAPROSYN) 500 MG tablet Take 1 tablet (500 mg total) by mouth 2 (two) times daily. 08/28/17   Zigmund Gottron, NP    Family History History reviewed. No pertinent family history.  Social History Social History   Tobacco Use  . Smoking status: Current Every Day Smoker    Packs/day: 0.50    Years: 27.00    Pack years: 13.50  . Smokeless tobacco: Never Used  . Tobacco comment: patient stated she has the patch and materials  Substance Use Topics  . Alcohol use: Yes  . Drug use: Yes    Types: Marijuana     Allergies   Patient has no known allergies.   Review of Systems Review of Systems   Physical Exam Triage Vital Signs ED Triage Vitals  Enc Vitals Group     BP 08/28/17 1145 (!) 137/93     Pulse Rate 08/28/17 1145 (!) 104     Resp 08/28/17 1145 20     Temp 08/28/17 1145 98.7  F (37.1 C)     Temp Source 08/28/17 1145 Oral     SpO2 08/28/17 1145 97 %     Weight --      Height --      Head Circumference --      Peak Flow --      Pain Score 08/28/17 1144 8     Pain Loc --      Pain Edu? --      Excl. in [place]? --    No data found.  Updated Vital Signs BP (!) 137/93 (BP Location: Left Arm)   Pulse (!) 104   Temp 98.7 F (37.1 C) (Oral)   Resp 20   LMP 03/02/2017   SpO2 97%    Physical Exam  Constitutional: She is oriented to person, place, and time. She appears well-developed and well-nourished. No distress.  Cardiovascular: Normal rate, regular rhythm and normal heart sounds.  Pulmonary/Chest: Effort normal and breath sounds normal.  Abdominal: Soft. There is no tenderness. There is no rigidity, no  rebound, no guarding and no CVA tenderness.  Musculoskeletal:       Lumbar back: She exhibits tenderness and pain. She exhibits no bony tenderness, no deformity, no laceration and normal pulse.       Back:  Significant left lower back pain on palpation; no bony tenderness or spinous process tenderness; strength equal bilaterally; gross sensation intact; pain with left hip flexion as well as pain with straight leg raise which radiates down thigh; obvious pain with transition from sit to lay and lay to sit; pain with ambulation visible   Neurological: She is alert and oriented to person, place, and time.  Skin: Skin is warm and dry.  Psychiatric:  Tearful in pain and states feels anxious      UC Treatments / Results  Labs (all labs ordered are listed, but only abnormal results are displayed) Labs Reviewed  POCT I-STAT, CHEM 8 - Abnormal; Notable for the following components:      Result Value   BUN 5 (*)    Glucose, Bld 201 (*)    All other components within normal limits  POCT URINALYSIS DIP (DEVICE)    EKG None  Radiology No results found.  Procedures Procedures (including critical care time)  Medications Ordered in UC Medications  ketorolac (TORADOL) injection 60 mg (60 mg Intramuscular Given 08/28/17 1206)  HYDROcodone-acetaminophen (NORCO/VICODIN) 5-325 MG per tablet 1 tablet (1 tablet Oral Given 08/28/17 1206)    Initial Impression / Assessment and Plan / UC Course  I have reviewed the triage vital signs and the nursing notes.  Pertinent labs & imaging results that were available during my care of the patient were reviewed by me and considered in my medical decision making (see chart for details).     Exam consistent with musculoskeletal. ua collected as stated had blood in urine and frequency, completely normal today. Chem 8 without elevation in crt.  toradol and norco in clinic today. Patient already with improvement in pain. Home with course of naproxen and flexeril  prn. To follow up with PCP for recheck. Return precautions provided. Patient verbalized understanding and agreeable to plan.  Ambulatory out of clinic without difficulty.  S/O driving patient.  Final Clinical Impressions(s) / UC Diagnoses   Final diagnoses:  Acute left-sided low back pain with left-sided sciatica     Discharge Instructions     Light and regular activity as tolerated.  Ice application at night may be helpful.  Heat  during the day to prevent muscle spasms.  May use muscle relaxer at night as needed. May cause drowsiness. Please do not take if driving or drinking alcohol.   Naproxen twice a day, take with food. May take first dose tonight.  If symptoms do not improve in the next week to return to follow up with PCP.   If worsening of pain, urinary symptoms, numbness or tingling to the legs, weakness, incontinence of urine or stool develop please return or go to Er.      ED Prescriptions    Medication Sig Dispense Auth. Provider   naproxen (NAPROSYN) 500 MG tablet Take 1 tablet (500 mg total) by mouth 2 (two) times daily. 30 tablet Augusto Gamble B, NP   cyclobenzaprine (FLEXERIL) 5 MG tablet Take 1 tablet (5 mg total) by mouth at bedtime. 15 tablet Zigmund Gottron, NP     Controlled Substance Prescriptions [place] Controlled Substance Registry consulted? Not Applicable   Zigmund Gottron, NP 08/28/17 1244

## 2017-09-06 ENCOUNTER — Ambulatory Visit (HOSPITAL_COMMUNITY)
Admission: EM | Admit: 2017-09-06 | Discharge: 2017-09-06 | Disposition: A | Discharge status: Home / Self Care | Payer: Medicaid Other | Attending: Internal Medicine | Admitting: Internal Medicine

## 2017-09-06 ENCOUNTER — Other Ambulatory Visit: Payer: Self-pay

## 2017-09-06 ENCOUNTER — Encounter (HOSPITAL_COMMUNITY): Payer: Self-pay

## 2017-09-06 DIAGNOSIS — L02214 Cutaneous abscess of groin: Secondary | ICD-10-CM | POA: Diagnosis not present

## 2017-09-06 DIAGNOSIS — L0291 Cutaneous abscess, unspecified: Secondary | ICD-10-CM

## 2017-09-06 MED ORDER — LIDOCAINE-EPINEPHRINE (PF) 2 %-1:200000 IJ SOLN
INTRAMUSCULAR | Status: AC
  Filled 2017-09-06: qty 20

## 2017-09-06 MED ORDER — SULFAMETHOXAZOLE-TRIMETHOPRIM 800-160 MG PO TABS: 1.0000 | ORAL_TABLET | Freq: Two times a day (BID) | ORAL | 0 refills | Status: AC

## 2017-09-06 NOTE — ED Provider Notes (Signed)
[name]    CSN: [id number] Arrival date & time: 09/06/17  1441     History   Chief Complaint Chief Complaint  Patient presents with  . Cyst    HPI Megan Riddle is a 42 y.o. female.   Megan Riddle presents with complaints of abscess to left inner groin which developed approximately 2 days ago. States has been getting them since she was age 23. Hx of DM. No drainage. No fevers. Painful. advil has helped some.    ROS per HPI.      Past Medical History:  Diagnosis Date  . Arthritis   . Cancer ([place])    breast  . Diabetes mellitus without complication [facility])     Patient Active Problem List   Diagnosis Date Noted  . OSA (obstructive sleep apnea) 02/20/2016  . Tobacco user 02/20/2016    History reviewed. No pertinent surgical history.  OB History   None      Home Medications    Prior to Admission medications   Medication Sig Start Date End Date Taking? Authorizing Provider  Cyanocobalamin (VITAMIN B 12 PO) Take 1 tablet by mouth daily.   Yes [provider]  metFORMIN (GLUCOPHAGE) 500 MG tablet Take 2 tablets (1,000 mg total) by mouth 2 (two) times daily. 12/19/15  Yes Antonietta Breach, PA-C  Multiple Vitamin (MULTIVITAMIN WITH MINERALS) TABS tablet Take 1 tablet by mouth daily.   Yes [provider]  naproxen (NAPROSYN) 500 MG tablet Take 1 tablet (500 mg total) by mouth 2 (two) times daily. 08/28/17  Yes Louisa Favaro, Lanelle Bal B, NP  gabapentin (NEURONTIN) 100 MG capsule Take 100 mg by mouth 3 (three) times daily. 11/08/15 11/07/16  [provider]  glipiZIDE (GLUCOTROL) 5 MG tablet Take 1 tablet (5 mg total) by mouth daily before breakfast. 07/14/17   Jaynee Eagles, PA-C  sulfamethoxazole-trimethoprim (BACTRIM DS) 800-160 MG tablet Take 1 tablet by mouth 2 (two) times daily for 7 days. 09/06/17 09/13/17  Zigmund Gottron, NP    Family History No family history on file.  Social History Social History   Tobacco Use  . Smoking status:  Current Every Day Smoker    Packs/day: 0.50    Years: 27.00    Pack years: 13.50  . Smokeless tobacco: Never Used  . Tobacco comment: patient stated she has the patch and materials  Substance Use Topics  . Alcohol use: Yes  . Drug use: Yes    Types: Marijuana     Allergies   Patient has no known allergies.   Review of Systems Review of Systems   Physical Exam Triage Vital Signs ED Triage Vitals  Enc Vitals Group     BP 09/06/17 1602 113/68     Pulse Rate 09/06/17 1602 86     Resp 09/06/17 1602 18     Temp 09/06/17 1602 98 F (36.7 C)     Temp Source 09/06/17 1602 Oral     SpO2 09/06/17 1602 97 %     Weight --      Height --      Head Circumference --      Peak Flow --      Pain Score 09/06/17 1603 7     Pain Loc --      Pain Edu? --      Excl. in [place]? --    No data found.  Updated Vital Signs BP 113/68 (BP Location: Left Arm)   Pulse 86   Temp 98 F (36.7  C) (Oral)   Resp 18   LMP 03/02/2017   SpO2 97%    Physical Exam  Constitutional: She is oriented to person, place, and time. She appears well-developed and well-nourished. No distress.  Cardiovascular: Normal rate, regular rhythm and normal heart sounds.  Pulmonary/Chest: Effort normal and breath sounds normal.  Genitourinary:     Genitourinary Comments: Abscess, tenderness, no active drainage; approximately 2 cm in length; very slight surrounding induration.   Neurological: She is alert and oriented to person, place, and time.  Skin: Skin is warm and dry.     UC Treatments / Results  Labs (all labs ordered are listed, but only abnormal results are displayed) Labs Reviewed - No data to display  EKG None  Radiology No results found.  Procedures Incision and Drainage Date/Time: 09/06/2017 4:40 PM Performed by: Zigmund Gottron, NP Authorized by: Wynona Luna, MD   Consent:    Consent obtained:  Verbal   Consent given by:  Patient   Risks discussed:  Infection, bleeding and  pain   Alternatives discussed:  No treatment, observation and alternative treatment Location:    Type:  Abscess   Size:  2   Location:  Anogenital   Anogenital location: left groin outside of vulva  Pre-procedure details:    Skin preparation:  Betadine Anesthesia (see MAR for exact dosages):    Anesthesia method:  Local infiltration   Local anesthetic:  Lidocaine 2% WITH epi Procedure type:    Complexity:  Simple Procedure details:    Incision types:  Single straight   Scalpel blade:  11   Wound management:  Probed and deloculated   Drainage:  Purulent   Drainage amount:  Moderate   Wound treatment:  Wound left open   Packing materials:  None Post-procedure details:    Patient tolerance of procedure:  Tolerated well, no immediate complications   (including critical care time)  Medications Ordered in UC Medications - No data to display  Initial Impression / Assessment and Plan / UC Course  I have reviewed the triage vital signs and the nursing notes.  Pertinent labs & imaging results that were available during my care of the patient were reviewed by me and considered in my medical decision making (see chart for details).     Wound care discussed. Successful I&D. Course of bactrim provided, she is diabetic. Return precautions provided. Patient verbalized understanding and agreeable to plan.   Final Clinical Impressions(s) / UC Diagnoses   Final diagnoses:  Abscess     Discharge Instructions     Keep dressing in place for the next 24 hours.  Complete course of antibiotics.  Warm soaks/compresses to promote further drainage.  Ibuprofen or tylenol as needed for pain.  If symptoms worsen or do not improve in the next week to return to be seen or to follow up with your PCP.     ED Prescriptions    Medication Sig Dispense Auth. Provider   sulfamethoxazole-trimethoprim (BACTRIM DS) 800-160 MG tablet Take 1 tablet by mouth 2 (two) times daily for 7 days. 14 tablet Zigmund Gottron, NP     Controlled Substance Prescriptions [place] Controlled Substance Registry consulted? Not Applicable   Zigmund Gottron, NP 09/06/17 [phone]

## 2017-09-06 NOTE — ED Triage Notes (Signed)
Pt presents today with cyst on bottom that started 2 days ago. Has gotten cyst in same area in the past and states they grow fast when she gets them.

## 2017-09-06 NOTE — Discharge Instructions (Signed)
Keep dressing in place for the next 24 hours.  Complete course of antibiotics.  Warm soaks/compresses to promote further drainage.  Ibuprofen or tylenol as needed for pain.  If symptoms worsen or do not improve in the next week to return to be seen or to follow up with your PCP.

## 2017-10-27 DIAGNOSIS — E113293 Type 2 diabetes mellitus with mild nonproliferative diabetic retinopathy without macular edema, bilateral: Secondary | ICD-10-CM | POA: Insufficient documentation

## 2018-01-02 ENCOUNTER — Other Ambulatory Visit: Payer: Self-pay

## 2018-01-02 ENCOUNTER — Encounter (HOSPITAL_COMMUNITY): Payer: Self-pay

## 2018-01-02 ENCOUNTER — Emergency Department (HOSPITAL_COMMUNITY)
Admission: EM | Admit: 2018-01-02 | Discharge: 2018-01-02 | Disposition: A | Discharge status: Home / Self Care | Payer: Medicaid Other | Attending: Emergency Medicine | Admitting: Emergency Medicine

## 2018-01-02 DIAGNOSIS — Z79899 Other long term (current) drug therapy: Secondary | ICD-10-CM | POA: Diagnosis not present

## 2018-01-02 DIAGNOSIS — F419 Anxiety disorder, unspecified: Secondary | ICD-10-CM | POA: Diagnosis not present

## 2018-01-02 DIAGNOSIS — Z853 Personal history of malignant neoplasm of breast: Secondary | ICD-10-CM | POA: Insufficient documentation

## 2018-01-02 DIAGNOSIS — R0789 Other chest pain: Secondary | ICD-10-CM | POA: Diagnosis present

## 2018-01-02 DIAGNOSIS — F17200 Nicotine dependence, unspecified, uncomplicated: Secondary | ICD-10-CM | POA: Diagnosis not present

## 2018-01-02 LAB — CBG MONITORING, ED: Glucose-Capillary: 227 mg/dL — ABNORMAL HIGH (ref 70–99)

## 2018-01-02 NOTE — ED Notes (Signed)
Pt ambulated with no assist; tolerated well. Oxygen saturations ranged from 94-96% while ambulating and 95% at rest.

## 2018-01-02 NOTE — ED Notes (Signed)
Bed: WS56 Expected date:  Expected time:  Means of arrival:  Comments: Ems anxiety

## 2018-01-02 NOTE — ED Triage Notes (Signed)
Pt brought in by [facility] due to feeling anxious upon waking up. Per EMS, pt got into an argument with her wife and went to bed. Pt then woke up to feeling a tingling sensation in hands and face. Per EMS, pt also had SHOB upon wakening.  Pt is ambulatory and not short of breath at this time.

## 2018-01-02 NOTE — Discharge Instructions (Signed)
Take your medication as directed.  Is very important that you take your medication for your diabetes.  Follow-up with your primary care doctor and therapist as needed.  Return to emergency department any chest pain, difficulty breathing, thoughts of wanting to hurt or kill yourself or any other worsening or concerning symptoms.

## 2018-01-02 NOTE — ED Provider Notes (Signed)
[facility] DEPT Provider Note   CSN: [id number] Arrival date & time: 01/02/18  0301     History   Chief Complaint Chief Complaint  Patient presents with  . Anxiety    HPI Megan Riddle is a 42 y.o. female past medical history of diabetes who presents for evaluation via EMS for anxiety.  Patient reports that she got in a verbal altercation with her wife at home this evening prior to going to sleep.  She reports that she was very agitated and upset.  She states she was crying and upset right before she went to bed.  She reports that a couple hours later, she woke up and was very anxious.  She states she had some chest tightness as well as some tingling in the bilateral tips of her fingers.  She also reports that when she initially woke up, she felt like she was having trouble breathing.  She also reports some room spinning sensation.  On ED arrival, she states that her symptoms have improved.  She denies any chest pain states that she feels tightness in her chest.  She states that this is consistent with her anxiety attacks.  She does state that the numbness in her bilateral fingertips is getting better.  Patient states that she has no suicidal or homicidal ideations.  Patient denies any fever, vision changes, nausea/vomiting, abdominal pain, numbness/weakness of her legs. She denies any OCP use, recent immobilization, prior history of DVT/PE, recent surgery, leg swelling, or long travel.  The history is provided by the patient.    Past Medical History:  Diagnosis Date  . Arthritis   . Cancer ([place])    breast  . Diabetes mellitus without complication [facility])     Patient Active Problem List   Diagnosis Date Noted  . OSA (obstructive sleep apnea) 02/20/2016  . Tobacco user 02/20/2016    History reviewed. No pertinent surgical history.   OB History   None      Home Medications    Prior to Admission medications   Medication Sig Start Date End  Date Taking? Authorizing Provider  Cyanocobalamin (VITAMIN B 12 PO) Take 1 tablet by mouth daily.   Yes [provider]  lisinopril (PRINIVIL,ZESTRIL) 2.5 MG tablet Take 2.5 mg by mouth daily. 12/04/17  Yes [provider]  metFORMIN (GLUCOPHAGE) 500 MG tablet Take 2 tablets (1,000 mg total) by mouth 2 (two) times daily. 12/19/15  Yes Antonietta Breach, PA-C  glipiZIDE (GLUCOTROL) 5 MG tablet Take 1 tablet (5 mg total) by mouth daily before breakfast. Patient not taking: Reported on 01/02/2018 07/14/17   Jaynee Eagles, PA-C  naproxen (NAPROSYN) 500 MG tablet Take 1 tablet (500 mg total) by mouth 2 (two) times daily. Patient not taking: Reported on 01/02/2018 08/28/17   Zigmund Gottron, NP    Family History No family history on file.  Social History Social History   Tobacco Use  . Smoking status: Current Every Day Smoker    Packs/day: 0.50    Years: 27.00    Pack years: 13.50  . Smokeless tobacco: Never Used  . Tobacco comment: patient stated she has the patch and materials  Substance Use Topics  . Alcohol use: Yes  . Drug use: Yes    Types: Marijuana     Allergies   Patient has no known allergies.   Review of Systems Review of Systems  Constitutional: Negative for fever.  Eyes: Negative for visual disturbance.  Respiratory: Positive for chest tightness  and shortness of breath. Stridor: resolved.   Neurological: Positive for dizziness.  Psychiatric/Behavioral: Negative for suicidal ideas. The patient is nervous/anxious.   All other systems reviewed and are negative.    Physical Exam Updated Vital Signs BP 104/63 (BP Location: Right Arm)   Pulse 68   Temp 98.4 F (36.9 C) (Oral)   Resp (!) 21   Ht 5' 2"  (1.575 m)   Wt 97.5 kg   LMP 03/02/2017   SpO2 96%   BMI 39.32 kg/m   Physical Exam  Constitutional: She is oriented to person, place, and time. She appears well-developed and well-nourished.  HENT:  Head: Normocephalic and atraumatic.  Mouth/Throat:  Oropharynx is clear and moist and mucous membranes are normal.  Eyes: Pupils are equal, round, and reactive to light. Conjunctivae, EOM and lids are normal.  EOMs intact without difficulty  Neck: Full passive range of motion without pain.  Cardiovascular: Normal rate, regular rhythm, normal heart sounds and normal pulses. Exam reveals no gallop and no friction rub.  No murmur heard. Pulses:      Radial pulses are 2+ on the right side, and 2+ on the left side.       Dorsalis pedis pulses are 2+ on the right side, and 2+ on the left side.  Pulmonary/Chest: Effort normal and breath sounds normal.  Lungs clear to auscultation bilaterally.  Symmetric chest rise.  No wheezing, rales, rhonchi.  Abdominal: Soft. Normal appearance. There is no tenderness. There is no rigidity and no guarding.  Musculoskeletal: Normal range of motion.  Neurological: She is alert and oriented to person, place, and time.  Cranial nerves III-XII intact Follows commands, Moves all extremities  5/5 strength to BUE and BLE  Sensation intact throughout all major nerve distributions Normal finger to nose. No dysdiadochokinesia. No pronator drift. No gait abnormalities  No slurred speech. No facial droop.   Skin: Skin is warm and dry. Capillary refill takes less than 2 seconds.  Psychiatric: Her speech is normal. Her mood appears anxious.  Nursing note and vitals reviewed.    ED Treatments / Results  Labs (all labs ordered are listed, but only abnormal results are displayed) Labs Reviewed  CBG MONITORING, ED - Abnormal; Notable for the following components:      Result Value   Glucose-Capillary 227 (*)    All other components within normal limits    EKG EKG Interpretation  Date/Time:  Friday January 02 2018 04:44:36 EST Ventricular Rate:  69 PR Interval:    QRS Duration: 84 QT Interval:  422 QTC Calculation: 453 R Axis:   80 Text Interpretation:  Sinus rhythm RAte improved compared to prior Confirmed  by Pryor Curia [phone]) on 01/02/2018 4:53:37 AM   Radiology No results found.  Procedures Procedures (including critical care time)  Medications Ordered in ED Medications - No data to display   Initial Impression / Assessment and Plan / ED Course  I have reviewed the triage vital signs and the nursing notes.  Pertinent labs & imaging results that were available during my care of the patient were reviewed by me and considered in my medical decision making (see chart for details).     42 year old female who presents for evaluation of anxiety.  She reports she got in a verbal altercation with her wife at home which caused her to become very upset.  She reports that prior to going to sleep, she had been crying and feeling very anxious.  She states she woke up about  an hour later with anxiety, chest tightness, shortness of breath, numbness/tingling in her fingertips and dizziness.  She states that the shortness of breath has resolved.  She denies any chest pain at this time but does state it feels slightly tight.  This is consistent with her anxiety attacks.  She does report that she feels like dizziness is little bit worse with her anxiety attack.  No vision changes, difficulty walking.  At this time, low suspicion for ACS etiology but will plan to check EKG given her history of diabetes.  History/physical exam is not concerning for PE.  We will plan for EKG and check her CBG and reassess.  EKG unremarkable.  CBC shows blood glucose in the 200s.  She does report that she has taken her medication.  We will plan to ambulate patient checking pulse ox.  Patient able to ambulate without any difficulty.  She denies any shortness of breath, chest pain, dizziness while ambulating.  O2 sats remained between 94-96%.  Patient with normal gait.  No signs of ataxia.  She states she does not have any dizziness while walking.  She states she feels better.  Patient states she is ready to go home.  Vital signs  are stable. At this time, patient exhibits no emergent life-threatening condition that require further evaluation in ED. Patient had ample opportunity for questions and discussion. All patient's questions were answered with full understanding. Strict return precautions discussed. Patient expresses understanding and agreement to plan.   Final Clinical Impressions(s) / ED Diagnoses   Final diagnoses:  Anxiety    ED Discharge Orders    None       Volanda Napoleon, PA-C 01/02/18 W[place], Delice Bison, DO 01/02/18 [phone]

## 2018-01-03 ENCOUNTER — Emergency Department (HOSPITAL_COMMUNITY)
Admission: EM | Admit: 2018-01-03 | Discharge: 2018-01-03 | Disposition: A | Discharge status: Home / Self Care | Payer: Medicaid Other | Attending: Emergency Medicine | Admitting: Emergency Medicine

## 2018-01-03 ENCOUNTER — Emergency Department (HOSPITAL_COMMUNITY): Payer: Medicaid Other

## 2018-01-03 ENCOUNTER — Encounter (HOSPITAL_COMMUNITY): Payer: Self-pay | Admitting: Emergency Medicine

## 2018-01-03 DIAGNOSIS — F17210 Nicotine dependence, cigarettes, uncomplicated: Secondary | ICD-10-CM | POA: Diagnosis not present

## 2018-01-03 DIAGNOSIS — Z79899 Other long term (current) drug therapy: Secondary | ICD-10-CM | POA: Insufficient documentation

## 2018-01-03 DIAGNOSIS — Z7984 Long term (current) use of oral hypoglycemic drugs: Secondary | ICD-10-CM | POA: Diagnosis not present

## 2018-01-03 DIAGNOSIS — F1290 Cannabis use, unspecified, uncomplicated: Secondary | ICD-10-CM | POA: Diagnosis not present

## 2018-01-03 DIAGNOSIS — E119 Type 2 diabetes mellitus without complications: Secondary | ICD-10-CM | POA: Diagnosis not present

## 2018-01-03 DIAGNOSIS — R0789 Other chest pain: Secondary | ICD-10-CM

## 2018-01-03 LAB — CBC
HCT: 36.8 % (ref 36.0–46.0)
Hemoglobin: 12.3 g/dL (ref 12.0–15.0)
MCH: 32.5 pg (ref 26.0–34.0)
MCHC: 33.4 g/dL (ref 30.0–36.0)
MCV: 97.4 fL (ref 80.0–100.0)
Platelets: 238 10*3/uL (ref 150–400)
RBC: 3.78 MIL/uL — ABNORMAL LOW (ref 3.87–5.11)
RDW: 12.1 % (ref 11.5–15.5)
WBC: 11.6 10*3/uL — ABNORMAL HIGH (ref 4.0–10.5)
nRBC: 0 % (ref 0.0–0.2)

## 2018-01-03 LAB — BASIC METABOLIC PANEL
Anion gap: 13 (ref 5–15)
CALCIUM: 8.8 mg/dL — AB (ref 8.9–10.3)
CO2: 21 mmol/L — ABNORMAL LOW (ref 22–32)
CREATININE: 0.58 mg/dL (ref 0.44–1.00)
Chloride: 104 mmol/L (ref 98–111)
GFR calc Af Amer: >60 mL/min (ref >60)
Glucose, Bld: 250 mg/dL — ABNORMAL HIGH (ref 70–99)
Potassium: 3.3 mmol/L — ABNORMAL LOW (ref 3.5–5.1)
Sodium: 138 mmol/L (ref 135–145)

## 2018-01-03 LAB — HEPATIC FUNCTION PANEL
ALT: 29 U/L (ref 0–44)
AST: 28 U/L (ref 15–41)
Albumin: 3.3 g/dL — ABNORMAL LOW (ref 3.5–5.0)
Alkaline Phosphatase: 28 U/L — ABNORMAL LOW (ref 38–126)
BILIRUBIN INDIRECT: 0.7 mg/dL (ref 0.3–0.9)
Bilirubin, Direct: 0.1 mg/dL (ref 0.0–0.2)
Total Bilirubin: 0.8 mg/dL (ref 0.3–1.2)
Total Protein: 6 g/dL — ABNORMAL LOW (ref 6.5–8.1)

## 2018-01-03 LAB — I-STAT TROPONIN, ED
Troponin i, poc: 0 ng/mL (ref 0.00–0.08)
Troponin i, poc: 0 ng/mL (ref 0.00–0.08)

## 2018-01-03 LAB — LIPASE, BLOOD: Lipase: 42 U/L (ref 11–51)

## 2018-01-03 LAB — I-STAT BETA HCG BLOOD, ED (MC, WL, AP ONLY): I-stat hCG, quantitative: <5 m[IU]/mL (ref <5)

## 2018-01-03 MED ORDER — SODIUM CHLORIDE 0.9 % IV BOLUS (SEPSIS)
1000.0000 mL | Freq: Once | INTRAVENOUS | Status: AC
  Administered 2018-01-03: 1000 mL via INTRAVENOUS

## 2018-01-03 MED ORDER — ACETAMINOPHEN 500 MG PO TABS
1000.0000 mg | ORAL_TABLET | Freq: Once | ORAL | Status: AC
  Administered 2018-01-03: 1000 mg via ORAL
  Filled 2018-01-03: qty 2

## 2018-01-03 NOTE — ED Provider Notes (Signed)
TIME SEEN: 2:34 AM  CHIEF COMPLAINT: Chest pain  HPI: Patient is a 42 year old female with history of previous breast cancer, diabetes, hyperlipidemia, tobacco use who presents to the emergency department with chest pain after smoking marijuana tonight.  States that she does smoke marijuana intermittently and thinks that she smokes something different tonight because she has never felt this way.  States she felt like her heart was racing and she felt very short of breath, weak and dizzy.  States she started feeling jittery and having chest heaviness.  Reports having headache as well and states she fell on the floor but denies loss of consciousness.  States she is still having headache, chest tightness and feeling very jittery now.  Given aspirin and nitroglycerin with EMS.  She denies history of hypertension, family history of CAD.  She has had a stress test 2 years ago.  No history of cardiac catheterization.  No history of PE, DVT, exogenous estrogen use, recent fractures, surgery, trauma, hospitalization or prolonged travel. No lower extremity swelling or pain. No calf tenderness.   ROS: See HPI Constitutional: no fever  Eyes: no drainage  ENT: no runny nose   Cardiovascular:   chest pain  Resp:  SOB  GI: no vomiting GU: no dysuria Integumentary: no rash  Allergy: no hives  Musculoskeletal: no leg swelling  Neurological: no slurred speech ROS otherwise negative  PAST MEDICAL HISTORY/PAST SURGICAL HISTORY:  Past Medical History:  Diagnosis Date  . Arthritis   . Cancer ([place])    breast  . Diabetes mellitus without complication ([place])     MEDICATIONS:  Prior to Admission medications   Medication Sig Start Date End Date Taking? Authorizing Provider  Cyanocobalamin (VITAMIN B 12 PO) Take 1 tablet by mouth daily.    [provider]  glipiZIDE (GLUCOTROL) 5 MG tablet Take 1 tablet (5 mg total) by mouth daily before breakfast. Patient not taking: Reported on 01/02/2018 07/14/17    Jaynee Eagles, PA-C  lisinopril (PRINIVIL,ZESTRIL) 2.5 MG tablet Take 2.5 mg by mouth daily. 12/04/17   [provider]  metFORMIN (GLUCOPHAGE) 500 MG tablet Take 2 tablets (1,000 mg total) by mouth 2 (two) times daily. 12/19/15   Antonietta Breach, PA-C  naproxen (NAPROSYN) 500 MG tablet Take 1 tablet (500 mg total) by mouth 2 (two) times daily. Patient not taking: Reported on 01/02/2018 08/28/17   Zigmund Gottron, NP    ALLERGIES:  No Known Allergies  SOCIAL HISTORY:  Social History   Tobacco Use  . Smoking status: Current Every Day Smoker    Packs/day: 0.50    Years: 27.00    Pack years: 13.50  . Smokeless tobacco: Never Used  . Tobacco comment: patient stated she has the patch and materials  Substance Use Topics  . Alcohol use: Yes    FAMILY HISTORY: No family history on file.  EXAM: BP 117/61 (BP Location: Left Arm)   Pulse 96   Temp 98.7 F (37.1 C) (Oral)   Resp 16   LMP 12/20/2017 (Approximate)   SpO2 100%  CONSTITUTIONAL: Alert and oriented and responds appropriately to questions. Well-appearing; well-nourished HEAD: Normocephalic EYES: Conjunctivae clear, pupils appear equal, EOMI ENT: normal nose; moist mucous membranes NECK: Supple, no meningismus, no nuchal rigidity, no LAD  CARD: RRR; S1 and S2 appreciated; no murmurs, no clicks, no rubs, no gallops RESP: Normal chest excursion without splinting or tachypnea; breath sounds clear and equal bilaterally; no wheezes, no rhonchi, no rales, no hypoxia or respiratory distress, speaking  full sentences ABD/GI: Normal bowel sounds; non-distended; soft, mildly tender to palpation diffusely throughout the abdomen except over the right lower quadrant, no rebound, no guarding, no peritoneal signs, no hepatosplenomegaly BACK:  The back appears normal and is non-tender to palpation, there is no CVA tenderness EXT: Normal ROM in all joints; non-tender to palpation; no edema; normal capillary refill; no cyanosis, no calf  tenderness or swelling    SKIN: Normal color for age and race; warm; no rash NEURO: Moves all extremities equally PSYCH: The patient's mood and manner are appropriate. Grooming and personal hygiene are appropriate.  MEDICAL DECISION MAKING: Patient here with atypical chest pain.  She does have some risk factors for ACS including diabetes, hyperlipidemia and tobacco use and was seen in the emergency department yesterday for chest pain after a panic attack.  States this episode today feels different.  EKG shows no ischemic abnormality.  Doubt dissection or PE.  Plan is to obtain cardiac work-up here and monitor patient.  Will treat symptomatically with Tylenol and IV fluids.  She denies any abdominal pain but is mildly tender diffusely throughout the abdomen.  Will check LFTs, lipase.  ED PROGRESS: Patient's first troponin is negative.  Chest x-ray clear.  Plan is to repeat second troponin at 5:30 AM.  Second troponin negative.  Patient now asymptomatic.  She is asking what was in the marijuana that she smoked today.  Offered urine drug screen although would not change our management and patient declined at this time.  Chest pain seems atypical in the setting of drug use.  I feel she is safe to be discharged with outpatient follow-up.   At this time, I do not feel there is any life-threatening condition present. I have reviewed and discussed all results (EKG, imaging, lab, urine as appropriate) and exam findings with patient/family. I have reviewed nursing notes and appropriate previous records.  I feel the patient is safe to be discharged home without further emergent workup and can continue workup as an outpatient as needed. Discussed usual and customary return precautions. Patient/family verbalize understanding and are comfortable with this plan.  Outpatient follow-up has been provided as needed. All questions have been answered.    EKG Interpretation  Date/Time:  Saturday January 03 2018 02:20:15  EST Ventricular Rate:  101 PR Interval:    QRS Duration: 82 QT Interval:  369 QTC Calculation: 479 R Axis:   59 Text Interpretation:  Sinus tachycardia Atrial premature complex No significant change since last tracing other than rate is faster Confirmed by Princesa Willig, Cyril Mourning [phone]) on 01/03/2018 2:34:41 AM         Gaynel Schaafsma, Delice Bison, DO 01/03/18 4585

## 2018-01-03 NOTE — ED Notes (Signed)
Patient currently at X-ray .

## 2018-01-03 NOTE — ED Triage Notes (Addendum)
Patient arrived with EMS from home reports central chest pain/pressure and SOB this evening after smoking marijuana , she received ASA 324 mg and 1 NTG sl with mild relief , denies emesis or diaphoresis .

## 2018-01-03 NOTE — ED Notes (Signed)
Dr. Leonides Schanz ( EDP) explained tests results and plan of care to pt.

## 2018-01-14 ENCOUNTER — Ambulatory Visit: Payer: Medicaid Other | Admitting: Adult Health

## 2018-03-22 ENCOUNTER — Emergency Department (HOSPITAL_COMMUNITY): Payer: Medicaid Other | Admitting: Anesthesiology

## 2018-03-22 ENCOUNTER — Encounter (HOSPITAL_COMMUNITY): Payer: Self-pay | Admitting: *Deleted

## 2018-03-22 ENCOUNTER — Inpatient Hospital Stay (HOSPITAL_COMMUNITY)
Admission: EM | Admit: 2018-03-22 | Discharge: 2018-03-30 | DRG: 272 | Disposition: A | Discharge status: Home / Self Care | Payer: Medicaid Other | Attending: Vascular Surgery | Admitting: Vascular Surgery

## 2018-03-22 ENCOUNTER — Encounter (HOSPITAL_COMMUNITY)
Admission: EM | Disposition: A | Discharge status: Home / Self Care | Payer: Self-pay | Source: Home / Self Care | Attending: Vascular Surgery

## 2018-03-22 ENCOUNTER — Inpatient Hospital Stay (HOSPITAL_COMMUNITY): Payer: Medicaid Other

## 2018-03-22 ENCOUNTER — Other Ambulatory Visit: Payer: Self-pay

## 2018-03-22 ENCOUNTER — Emergency Department (HOSPITAL_COMMUNITY): Payer: Medicaid Other

## 2018-03-22 DIAGNOSIS — E119 Type 2 diabetes mellitus without complications: Secondary | ICD-10-CM | POA: Diagnosis present

## 2018-03-22 DIAGNOSIS — Z853 Personal history of malignant neoplasm of breast: Secondary | ICD-10-CM

## 2018-03-22 DIAGNOSIS — I745 Embolism and thrombosis of iliac artery: Secondary | ICD-10-CM | POA: Diagnosis present

## 2018-03-22 DIAGNOSIS — E876 Hypokalemia: Secondary | ICD-10-CM | POA: Diagnosis not present

## 2018-03-22 DIAGNOSIS — Z7984 Long term (current) use of oral hypoglycemic drugs: Secondary | ICD-10-CM | POA: Diagnosis not present

## 2018-03-22 DIAGNOSIS — G4733 Obstructive sleep apnea (adult) (pediatric): Secondary | ICD-10-CM | POA: Diagnosis present

## 2018-03-22 DIAGNOSIS — E669 Obesity, unspecified: Secondary | ICD-10-CM | POA: Diagnosis present

## 2018-03-22 DIAGNOSIS — Z6837 Body mass index (BMI) 37.0-37.9, adult: Secondary | ICD-10-CM | POA: Diagnosis not present

## 2018-03-22 DIAGNOSIS — Z23 Encounter for immunization: Secondary | ICD-10-CM

## 2018-03-22 DIAGNOSIS — Z8249 Family history of ischemic heart disease and other diseases of the circulatory system: Secondary | ICD-10-CM

## 2018-03-22 DIAGNOSIS — F17210 Nicotine dependence, cigarettes, uncomplicated: Secondary | ICD-10-CM | POA: Diagnosis present

## 2018-03-22 DIAGNOSIS — M79672 Pain in left foot: Secondary | ICD-10-CM | POA: Diagnosis present

## 2018-03-22 DIAGNOSIS — Z09 Encounter for follow-up examination after completed treatment for conditions other than malignant neoplasm: Secondary | ICD-10-CM

## 2018-03-22 DIAGNOSIS — Z9889 Other specified postprocedural states: Secondary | ICD-10-CM

## 2018-03-22 DIAGNOSIS — I743 Embolism and thrombosis of arteries of the lower extremities: Secondary | ICD-10-CM

## 2018-03-22 DIAGNOSIS — I998 Other disorder of circulatory system: Secondary | ICD-10-CM

## 2018-03-22 DIAGNOSIS — Z79899 Other long term (current) drug therapy: Secondary | ICD-10-CM

## 2018-03-22 DIAGNOSIS — R209 Unspecified disturbances of skin sensation: Secondary | ICD-10-CM | POA: Diagnosis present

## 2018-03-22 DIAGNOSIS — I4891 Unspecified atrial fibrillation: Secondary | ICD-10-CM | POA: Diagnosis not present

## 2018-03-22 DIAGNOSIS — M79605 Pain in left leg: Secondary | ICD-10-CM | POA: Diagnosis not present

## 2018-03-22 HISTORY — PX: INTRAOPERATIVE ARTERIOGRAM: SHX5157

## 2018-03-22 HISTORY — PX: FEMORAL-FEMORAL BYPASS GRAFT: SHX936

## 2018-03-22 HISTORY — PX: PATCH ANGIOPLASTY: SHX6230

## 2018-03-22 LAB — CBC WITH DIFFERENTIAL/PLATELET
Abs Immature Granulocytes: 0.04 10*3/uL (ref 0.00–0.07)
Basophils Absolute: 0.1 10*3/uL (ref 0.0–0.1)
Basophils Relative: 0 %
Eosinophils Absolute: 0.1 10*3/uL (ref 0.0–0.5)
Eosinophils Relative: 1 %
HCT: 37 % (ref 36.0–46.0)
HEMOGLOBIN: 12.2 g/dL (ref 12.0–15.0)
Immature Granulocytes: 0 %
Lymphocytes Relative: 25 %
Lymphs Abs: 3.3 10*3/uL (ref 0.7–4.0)
MCH: 32.5 pg (ref 26.0–34.0)
MCHC: 33 g/dL (ref 30.0–36.0)
MCV: 98.7 fL (ref 80.0–100.0)
MONOS PCT: 5 %
Monocytes Absolute: 0.7 10*3/uL (ref 0.1–1.0)
Neutro Abs: 9.2 10*3/uL — ABNORMAL HIGH (ref 1.7–7.7)
Neutrophils Relative %: 69 %
Platelets: 330 10*3/uL (ref 150–400)
RBC: 3.75 MIL/uL — ABNORMAL LOW (ref 3.87–5.11)
RDW: 11.5 % (ref 11.5–15.5)
WBC: 13.5 10*3/uL — ABNORMAL HIGH (ref 4.0–10.5)
nRBC: 0 % (ref 0.0–0.2)

## 2018-03-22 LAB — I-STAT BETA HCG BLOOD, ED (MC, WL, AP ONLY): I-stat hCG, quantitative: <5 m[IU]/mL (ref <5)

## 2018-03-22 LAB — BASIC METABOLIC PANEL
Anion gap: 11 (ref 5–15)
CO2: 22 mmol/L (ref 22–32)
Calcium: 9.4 mg/dL (ref 8.9–10.3)
Chloride: 102 mmol/L (ref 98–111)
Creatinine, Ser: 0.53 mg/dL (ref 0.44–1.00)
GFR calc Af Amer: >60 mL/min (ref >60)
GFR calc non Af Amer: >60 mL/min (ref >60)
Glucose, Bld: 276 mg/dL — ABNORMAL HIGH (ref 70–99)
Potassium: 3.4 mmol/L — ABNORMAL LOW (ref 3.5–5.1)
Sodium: 135 mmol/L (ref 135–145)

## 2018-03-22 LAB — I-STAT CREATININE, ED: Creatinine, Ser: 0.4 mg/dL — ABNORMAL LOW (ref 0.44–1.00)

## 2018-03-22 SURGERY — CREATION, BYPASS, ARTERIAL, FEMORAL TO FEMORAL, USING GRAFT
Anesthesia: General | Site: Leg Upper | Laterality: Left

## 2018-03-22 MED ORDER — HEPARIN (PORCINE) 25000 UT/250ML-% IV SOLN
600.0000 [IU]/h | INTRAVENOUS | Status: DC
  Administered 2018-03-22: 1200 [IU]/h via INTRAVENOUS
  Filled 2018-03-22: qty 250

## 2018-03-22 MED ORDER — ROCURONIUM BROMIDE 50 MG/5ML IV SOSY
PREFILLED_SYRINGE | INTRAVENOUS | Status: AC
  Filled 2018-03-22: qty 5

## 2018-03-22 MED ORDER — SUCCINYLCHOLINE CHLORIDE 200 MG/10ML IV SOSY
PREFILLED_SYRINGE | INTRAVENOUS | Status: AC
  Filled 2018-03-22: qty 10

## 2018-03-22 MED ORDER — SODIUM CHLORIDE 0.9 % IV SOLN
INTRAVENOUS | Status: AC
  Filled 2018-03-22: qty 1.2

## 2018-03-22 MED ORDER — LIDOCAINE HCL (CARDIAC) PF 100 MG/5ML IV SOSY
PREFILLED_SYRINGE | INTRAVENOUS | Status: DC | PRN
  Administered 2018-03-22: 60 mg via INTRATRACHEAL

## 2018-03-22 MED ORDER — FENTANYL CITRATE (PF) 250 MCG/5ML IJ SOLN
INTRAMUSCULAR | Status: AC
  Filled 2018-03-22: qty 5

## 2018-03-22 MED ORDER — IOPAMIDOL (ISOVUE-370) INJECTION 76%
100.0000 mL | Freq: Once | INTRAVENOUS | Status: AC | PRN
  Administered 2018-03-22: 100 mL via INTRAVENOUS

## 2018-03-22 MED ORDER — SUCCINYLCHOLINE 20MG/ML (10ML) SYRINGE FOR MEDFUSION PUMP - OPTIME
INTRAMUSCULAR | Status: DC | PRN
  Administered 2018-03-22: 120 mg via INTRAVENOUS

## 2018-03-22 MED ORDER — PROPOFOL 10 MG/ML IV BOLUS
INTRAVENOUS | Status: DC | PRN
  Administered 2018-03-22: 200 mg via INTRAVENOUS

## 2018-03-22 MED ORDER — HEPARIN SODIUM (PORCINE) 1000 UNIT/ML IJ SOLN
INTRAMUSCULAR | Status: AC
  Filled 2018-03-22: qty 1

## 2018-03-22 MED ORDER — IOPAMIDOL (ISOVUE-370) INJECTION 76%
INTRAVENOUS | Status: AC
  Filled 2018-03-22: qty 100

## 2018-03-22 MED ORDER — PAPAVERINE HCL 30 MG/ML IJ SOLN
INTRAMUSCULAR | Status: AC
  Filled 2018-03-22: qty 2

## 2018-03-22 MED ORDER — MIDAZOLAM HCL 2 MG/2ML IJ SOLN
INTRAMUSCULAR | Status: DC | PRN
  Administered 2018-03-22: 2 mg via INTRAVENOUS

## 2018-03-22 MED ORDER — CEFAZOLIN SODIUM-DEXTROSE 2-3 GM-%(50ML) IV SOLR
INTRAVENOUS | Status: DC | PRN
  Administered 2018-03-22: 2 g via INTRAVENOUS

## 2018-03-22 MED ORDER — CEFAZOLIN SODIUM-DEXTROSE 2-4 GM/100ML-% IV SOLN
INTRAVENOUS | Status: AC
  Filled 2018-03-22: qty 100

## 2018-03-22 MED ORDER — SODIUM CHLORIDE 0.9 % IV SOLN
INTRAVENOUS | Status: DC | PRN
  Administered 2018-03-22 (×2): via INTRAVENOUS

## 2018-03-22 MED ORDER — 0.9 % SODIUM CHLORIDE (POUR BTL) OPTIME
TOPICAL | Status: DC | PRN
  Administered 2018-03-22: 2000 mL

## 2018-03-22 MED ORDER — HEPARIN BOLUS VIA INFUSION
4500.0000 [IU] | Freq: Once | INTRAVENOUS | Status: AC
  Administered 2018-03-22: 4500 [IU] via INTRAVENOUS
  Filled 2018-03-22: qty 4500

## 2018-03-22 MED ORDER — SODIUM CHLORIDE 0.9 % IV BOLUS
1000.0000 mL | Freq: Once | INTRAVENOUS | Status: AC
  Administered 2018-03-22: 1000 mL via INTRAVENOUS

## 2018-03-22 MED ORDER — MORPHINE SULFATE (PF) 4 MG/ML IV SOLN
4.0000 mg | Freq: Once | INTRAVENOUS | Status: AC
  Administered 2018-03-22: 4 mg via INTRAVENOUS
  Filled 2018-03-22: qty 1

## 2018-03-22 MED ORDER — IODIXANOL 320 MG/ML IV SOLN
INTRAVENOUS | Status: DC | PRN
  Administered 2018-03-22: 100 mL

## 2018-03-22 MED ORDER — SODIUM CHLORIDE 0.9 % IV SOLN
INTRAVENOUS | Status: DC | PRN
  Administered 2018-03-22: 500 mL

## 2018-03-22 MED ORDER — PAPAVERINE HCL 30 MG/ML IJ SOLN
INTRAMUSCULAR | Status: DC | PRN
  Administered 2018-03-22: 60 mg

## 2018-03-22 MED ORDER — ROCURONIUM 10MG/ML (10ML) SYRINGE FOR MEDFUSION PUMP - OPTIME
INTRAVENOUS | Status: DC | PRN
  Administered 2018-03-22: 30 mg via INTRAVENOUS
  Administered 2018-03-22: 20 mg via INTRAVENOUS

## 2018-03-22 MED ORDER — SUGAMMADEX SODIUM 200 MG/2ML IV SOLN
INTRAVENOUS | Status: DC | PRN
  Administered 2018-03-22: 200 mg via INTRAVENOUS

## 2018-03-22 MED ORDER — PROPOFOL 10 MG/ML IV BOLUS
INTRAVENOUS | Status: AC
  Filled 2018-03-22: qty 20

## 2018-03-22 MED ORDER — LIDOCAINE 2% (20 MG/ML) 5 ML SYRINGE
INTRAMUSCULAR | Status: AC
  Filled 2018-03-22: qty 5

## 2018-03-22 MED ORDER — ONDANSETRON HCL 4 MG/2ML IJ SOLN
INTRAMUSCULAR | Status: DC | PRN
  Administered 2018-03-22: 4 mg via INTRAVENOUS

## 2018-03-22 MED ORDER — MIDAZOLAM HCL 2 MG/2ML IJ SOLN
INTRAMUSCULAR | Status: AC
  Filled 2018-03-22: qty 2

## 2018-03-22 MED ORDER — PROTAMINE SULFATE 10 MG/ML IV SOLN
INTRAVENOUS | Status: AC
  Filled 2018-03-22: qty 5

## 2018-03-22 MED ORDER — HEPARIN SODIUM (PORCINE) 1000 UNIT/ML IJ SOLN
INTRAMUSCULAR | Status: DC | PRN
  Administered 2018-03-22: 9000 [IU] via INTRAVENOUS

## 2018-03-22 MED ORDER — FENTANYL CITRATE (PF) 250 MCG/5ML IJ SOLN
INTRAMUSCULAR | Status: DC | PRN
  Administered 2018-03-22: 50 ug via INTRAVENOUS
  Administered 2018-03-22: 100 ug via INTRAVENOUS
  Administered 2018-03-22 (×2): 50 ug via INTRAVENOUS

## 2018-03-22 SURGICAL SUPPLY — 51 items
CANISTER SUCT 3000ML PPV (MISCELLANEOUS) ×3 IMPLANT
CANNULA VESSEL 3MM 2 BLNT TIP (CANNULA) ×9 IMPLANT
CATH EMB 4FR 80CM (CATHETERS) ×3 IMPLANT
CLIP VESOCCLUDE MED 24/CT (CLIP) ×3 IMPLANT
CLIP VESOCCLUDE SM WIDE 24/CT (CLIP) ×3 IMPLANT
CONT SPEC 4OZ CLIKSEAL STRL BL (MISCELLANEOUS) ×3 IMPLANT
COVER WAND RF STERILE (DRAPES) ×3 IMPLANT
DERMABOND ADVANCED (GAUZE/BANDAGES/DRESSINGS) ×1
DERMABOND ADVANCED .7 DNX12 (GAUZE/BANDAGES/DRESSINGS) ×2 IMPLANT
DRAIN CHANNEL 15F RND FF W/TCR (WOUND CARE) ×3 IMPLANT
DRESSING PREVENA PLUS CUSTOM (GAUZE/BANDAGES/DRESSINGS) ×2 IMPLANT
DRSG PREVENA PLUS CUSTOM (GAUZE/BANDAGES/DRESSINGS) ×3
ELECT REM PT RETURN 9FT ADLT (ELECTROSURGICAL) ×3
ELECTRODE REM PT RTRN 9FT ADLT (ELECTROSURGICAL) ×2 IMPLANT
EVACUATOR SILICONE 100CC (DRAIN) ×3 IMPLANT
GAUZE SPONGE 4X4 12PLY STRL LF (GAUZE/BANDAGES/DRESSINGS) ×3 IMPLANT
GLOVE BIO SURGEON STRL SZ7.5 (GLOVE) ×3 IMPLANT
GLOVE BIOGEL PI IND STRL 8 (GLOVE) ×2 IMPLANT
GLOVE BIOGEL PI INDICATOR 8 (GLOVE) ×1
GOWN STRL REUS W/ TWL LRG LVL3 (GOWN DISPOSABLE) ×6 IMPLANT
GOWN STRL REUS W/TWL LRG LVL3 (GOWN DISPOSABLE) ×3
INSERT FOGARTY SM (MISCELLANEOUS) IMPLANT
KIT BASIN OR (CUSTOM PROCEDURE TRAY) ×3 IMPLANT
KIT DRSG PREVENA PLUS 7DAY 125 (MISCELLANEOUS) ×3 IMPLANT
KIT PREVENA INCISION MGT 13 (CANNISTER) ×3 IMPLANT
KIT TURNOVER KIT B (KITS) ×3 IMPLANT
NS IRRIG 1000ML POUR BTL (IV SOLUTION) ×6 IMPLANT
PACK PERIPHERAL VASCULAR (CUSTOM PROCEDURE TRAY) ×3 IMPLANT
PAD ARMBOARD 7.5X6 YLW CONV (MISCELLANEOUS) ×6 IMPLANT
SET COLLECT BLD 21X3/4 12 PB (MISCELLANEOUS) ×3 IMPLANT
SPONGE SURGIFOAM ABS GEL 100 (HEMOSTASIS) IMPLANT
STOPCOCK 4 WAY LG BORE MALE ST (IV SETS) ×3 IMPLANT
SUT ETHILON 3 0 PS 1 (SUTURE) ×6 IMPLANT
SUT PROLENE 5 0 C 1 24 (SUTURE) ×3 IMPLANT
SUT PROLENE 6 0 BV (SUTURE) ×6 IMPLANT
SUT SILK 2 0 PERMA HAND 18 BK (SUTURE) IMPLANT
SUT SILK 2 0 SH (SUTURE) IMPLANT
SUT VIC AB 2-0 CTB1 (SUTURE) ×3 IMPLANT
SUT VIC AB 3-0 SH 27 (SUTURE) ×1
SUT VIC AB 3-0 SH 27X BRD (SUTURE) ×2 IMPLANT
SUT VICRYL 4-0 PS2 18IN ABS (SUTURE) ×3 IMPLANT
SYR 20CC LL (SYRINGE) ×3 IMPLANT
SYR 30ML LL (SYRINGE) ×3 IMPLANT
SYR 3ML LL SCALE MARK (SYRINGE) ×3 IMPLANT
SYRINGE 20CC LL (MISCELLANEOUS) ×3 IMPLANT
TAPE CLOTH SURG 4X10 WHT LF (GAUZE/BANDAGES/DRESSINGS) ×3 IMPLANT
TOWEL GREEN STERILE (TOWEL DISPOSABLE) ×3 IMPLANT
TRAY FOLEY MTR SLVR 16FR STAT (SET/KITS/TRAYS/PACK) ×3 IMPLANT
TUBING EXTENTION W/L.L. (IV SETS) ×3 IMPLANT
UNDERPAD 30X30 (UNDERPADS AND DIAPERS) ×3 IMPLANT
WATER STERILE IRR 1000ML POUR (IV SOLUTION) ×3 IMPLANT

## 2018-03-22 NOTE — Op Note (Signed)
    NAME: Megan Riddle    MRN: [id number] DOB: 1975/02/09    DATE OF OPERATION: 03/22/2018  PREOP DIAGNOSIS:    Ischemic left lower extremity secondary to left common iliac and external iliac artery embolus  POSTOP DIAGNOSIS:    Same  PROCEDURE:    1.  Left iliofemoral thrombectomy 2.  Vein patch angioplasty of the left common femoral artery with saphenous vein graft 3.  Intraoperative arteriogram  SURGEON: Judeth Cornfield. Scot Dock, MD, FACS  ASSIST: Laurence Slate, PA  ANESTHESIA: General  EBL: 150 cc  INDICATIONS:    [name] is a 43 y.o. female who presented with an ischemic left lower extremity.  On CT angios she was noted to have clot in the common and external iliac veins.  She presents for attempted revascularization.  FINDINGS:   Clot was retrieved proximally that looks somewhat abnormal therefore this will be sent to pathology to rule out potential sources such as a myxoma.  Completion arteriogram showed three-vessel runoff below that was difficult to and getting good visualization distally.  TECHNIQUE:   The patient was taken to the operating room and received a general anesthetic.  Both groins and the entire left lower extremity were prepped and draped in the usual sterile fashion.  Given her obesity I elected to make an oblique incision in the left groin between the creases.  Through this incision the common femoral, superficial femoral, and deep femoral arteries were dissected free and controlled with Vesseloops.  The patient was heparinized.  The vessels were controlled and a longitudinal arteriotomy made in the common femoral artery.  A 4 Fogarty catheter was passed proximally and a large amount of clot was retrieved.  This was sent to pathology.  All 4 passes were made until no further clot was retrieved.  The catheter was then passed distally down the superficial femoral artery and no clot was retrieved.  The anterior sensory saphenous vein had been  harvested and opened longitudinally to be used as a vein patch.  This was sewn using continuous 6-0 Prolene suture.  Prior to completing this anastomosis the arteries were backbled and flushed appropriately and the anastomosis completed.  Flow was reestablished to the left leg.  Completion arteriogram was obtained.  It was difficult to time the arteriogram to get contrast distally but it there appeared to be three-vessel runoff.  At the completion there was an anterior tibial, peroneal, and posterior tibial signal with the Doppler.  The patient was started on 600 units an hour of heparin.  The wound was closed over a 15 Blake drain with a deep layer of 2-0 Vicryl, a subcutaneous layer with 3-0 Vicryl and the skin closed with 4-0 Vicryl.  A Praveena dressing was applied.  The patient tolerated the procedure well and was transferred to the recovery room in stable condition.  All needle and sponge counts were correct.  Deitra Mayo, MD, FACS [facility]  DATE OF DICTATION:   03/22/2018

## 2018-03-22 NOTE — ED Provider Notes (Signed)
[facility] EMERGENCY DEPARTMENT Provider Note   CSN: [id number] Arrival date & time: 03/22/18  1721    History   Chief Complaint Chief Complaint  Patient presents with  . Leg Pain    HPI Megan Riddle is a 43 y.o. female with a past medical history of breast cancer, DM, OSA, who presents today for evaluation of left leg pain.  She reports that over the past 2 days her left leg has been becoming gradually cold, she says her toes feel like they are icicles.  This has progressed to where she feels like her left foot is losing color, and is becoming more painful and tight.  She denies any recent trauma.  She has never had any blood clots before.  Does not take any hormones. She reports decreased sensation across the bottom of her foot     HPI  Past Medical History:  Diagnosis Date  . Arthritis   . Cancer ([place])    breast  . Diabetes mellitus without complication [facility])     Patient Active Problem List   Diagnosis Date Noted  . OSA (obstructive sleep apnea) 02/20/2016  . Tobacco user 02/20/2016    History reviewed. No pertinent surgical history.   OB History   No obstetric history on file.      Home Medications    Prior to Admission medications   Medication Sig Start Date End Date Taking? Authorizing Provider  Cyanocobalamin (VITAMIN B 12 PO) Take 1 tablet by mouth daily.    [provider]  glipiZIDE (GLUCOTROL) 5 MG tablet Take 1 tablet (5 mg total) by mouth daily before breakfast. Patient not taking: Reported on 01/02/2018 07/14/17   Jaynee Eagles, PA-C  lisinopril (PRINIVIL,ZESTRIL) 2.5 MG tablet Take 2.5 mg by mouth daily. 12/04/17   [provider]  metFORMIN (GLUCOPHAGE) 500 MG tablet Take 2 tablets (1,000 mg total) by mouth 2 (two) times daily. 12/19/15   Antonietta Breach, PA-C  naproxen (NAPROSYN) 500 MG tablet Take 1 tablet (500 mg total) by mouth 2 (two) times daily. Patient not taking: Reported on 01/02/2018 08/28/17   Zigmund Gottron, NP    Family History No family history on file.  Social History Social History   Tobacco Use  . Smoking status: Current Every Day Smoker    Packs/day: 0.50    Years: 27.00    Pack years: 13.50  . Smokeless tobacco: Never Used  . Tobacco comment: patient stated she has the patch and materials  Substance Use Topics  . Alcohol use: Yes  . Drug use: Yes    Types: Marijuana     Allergies   Patient has no known allergies.   Review of Systems Review of Systems  Constitutional: Negative for chills and fever.  HENT: Negative for congestion.   Respiratory: Negative for cough, chest tightness and shortness of breath.   Cardiovascular: Negative for chest pain.  Gastrointestinal: Negative for abdominal pain, diarrhea, nausea and vomiting.  Genitourinary: Negative for dysuria and urgency.  Musculoskeletal: Negative for back pain and neck pain.  Skin: Positive for pallor (Left foot. ).  Neurological: Negative for weakness and headaches.  All other systems reviewed and are negative.    Physical Exam Updated Vital Signs BP 132/81   Pulse 97   Temp 98.9 F (37.2 C)   Resp 18   Ht 5' 2"  (1.575 m)   Wt 93 kg   LMP 03/13/2017   SpO2 96%   BMI 37.49 kg/m  Physical Exam Vitals signs and nursing note reviewed.  Constitutional:      General: She is not in acute distress.    Appearance: She is well-developed. She is obese.  HENT:     Head: Normocephalic and atraumatic.  Eyes:     Conjunctiva/sclera: Conjunctivae normal.  Neck:     Musculoskeletal: Normal range of motion and neck supple.  Cardiovascular:     Rate and Rhythm: Normal rate and regular rhythm.     Pulses:          Femoral pulses are 2+ on the right side and 1+ on the left side.      Dorsalis pedis pulses are 1+ on the right side and detected w/ Doppler on the left side.       Posterior tibial pulses are 2+ on the right side and detected w/ Doppler on the left side.     Heart sounds: Normal heart  sounds. No murmur.  Pulmonary:     Effort: Pulmonary effort is normal. No respiratory distress.     Breath sounds: Normal breath sounds.  Abdominal:     Palpations: Abdomen is soft.     Tenderness: There is no abdominal tenderness.  Musculoskeletal:     Right lower leg: No edema.     Left lower leg: Edema present.     Comments: Diffuse tenderness to palpation over left lower leg.  No crepitus or deformity.   Skin:    General: Skin is warm and dry.     Comments: Left foot is cool when compared to right foot.   Neurological:     Mental Status: She is alert.     Comments: Decreased subjective sensation to light touch over bottom of left foot, left lateral lower leg.  Sensation is normal over remainder of left leg.      ED Treatments / Results  Labs (all labs ordered are listed, but only abnormal results are displayed) Labs Reviewed  BASIC METABOLIC PANEL - Abnormal; Notable for the following components:      Result Value   Potassium 3.4 (*)    Glucose, Bld 276 (*)    BUN <5 (*)    All other components within normal limits  CBC WITH DIFFERENTIAL/PLATELET - Abnormal; Notable for the following components:   WBC 13.5 (*)    RBC 3.75 (*)    Neutro Abs 9.2 (*)    All other components within normal limits  I-STAT CREATININE, ED - Abnormal; Notable for the following components:   Creatinine, Ser 0.40 (*)    All other components within normal limits  HEPARIN LEVEL (UNFRACTIONATED)  CBC  I-STAT BETA HCG BLOOD, ED ([facility], [facility], [facility] ONLY)    EKG None  Radiology Ct Angio Aortobifemoral W And/or Wo Contrast  Result Date: 03/22/2018 CLINICAL DATA:  Left lower leg pain from the knee down. Decreased pulses in the left leg. Cold left leg. History of diabetes, breast cancer, arthritis. EXAM: CT ANGIOGRAPHY OF ABDOMINAL AORTA WITH ILIOFEMORAL RUNOFF TECHNIQUE: Multidetector CT imaging of the abdomen, pelvis and lower extremities was performed using the standard protocol during bolus  administration of intravenous contrast. Multiplanar CT image reconstructions and MIPs were obtained to evaluate the vascular anatomy. CONTRAST:  160m ISOVUE-370 IOPAMIDOL (ISOVUE-370) INJECTION 76% COMPARISON:  None. FINDINGS: VASCULAR Aorta: Normal caliber aorta without aneurysm, dissection, vasculitis or significant stenosis. Celiac: Patent without evidence of aneurysm, dissection, vasculitis or significant stenosis. SMA: Patent without evidence of aneurysm, dissection, vasculitis or significant stenosis. Renals: Both renal  arteries are patent without evidence of aneurysm, dissection, vasculitis, fibromuscular dysplasia or significant stenosis. IMA: Patent without evidence of aneurysm, dissection, vasculitis or significant stenosis. RIGHT Lower Extremity Inflow: Mild calcification in the common iliac artery and at the origin of the internal iliac artery. Common, internal, and external iliac arteries remain patent. No aneurysm or dissection. Outflow: Common, superficial and profunda femoral arteries and the popliteal artery are patent without evidence of aneurysm, dissection, vasculitis or significant stenosis. Runoff: Patent three vessel runoff to the ankle. LEFT Lower Extremity Inflow: There is an about 4.5 cm segment of acute appearing thrombus demonstrated in the common iliac artery with focal complete or near complete occlusion at the level of the bifurcation. Thrombus extends to the bifurcation with occlusion of the internal iliac artery. Reconstitution of the external iliac artery proximally. Distal reconstitution of the internal iliac artery. Outflow: Common, superficial and profunda femoral arteries and the popliteal artery are patent without evidence of aneurysm, dissection, vasculitis or significant stenosis. Runoff: Patent three vessel runoff to the ankle. Veins: No obvious venous abnormality within the limitations of this arterial phase study. Review of the MIP images confirms the above findings.  NON-VASCULAR Lower chest: Probable motion artifact and dependent changes in the lung bases. No obvious consolidation. Hepatobiliary: No focal liver abnormality is seen. No gallstones, gallbladder wall thickening, or biliary dilatation. Pancreas: Unremarkable. No pancreatic ductal dilatation or surrounding inflammatory changes. Spleen: Normal in size without focal abnormality. Adrenals/Urinary Tract: Adrenal glands are unremarkable. Kidneys are normal, without renal calculi, focal lesion, or hydronephrosis. Bladder is unremarkable. Stomach/Bowel: Stomach is within normal limits. Appendix appears normal. No evidence of bowel wall thickening, distention, or inflammatory changes. Lymphatic: No significant lymphadenopathy. Reproductive: Uterus appears somewhat enlarged, possibly fibroid. No abnormal adnexal masses. Other: No abdominal wall hernia or abnormality. No abdominopelvic ascites. Musculoskeletal: No acute or significant osseous findings. IMPRESSION: VASCULAR There is an about 4.5 cm segment of acute thrombus in the left common iliac artery with focal complete or near complete occlusion at the level of the bifurcation. Thrombus extends into the bifurcation with occlusion of the internal iliac artery. Reconstitution of the external iliac artery proximally. Distal reconstitution of the internal iliac artery proximally. NON-VASCULAR No acute process demonstrated in the abdomen or pelvis. These results were called by telephone at the time of interpretation on 03/22/2018 at 8:06 pm to PA. Davontay Watlington , who verbally acknowledged these results. Electronically Signed   By: Lucienne Capers M.D.   On: 03/22/2018 20:10    Procedures .Critical Care Performed by: Lorin Glass, PA-C Authorized by: Lorin Glass, PA-C   Critical care provider statement:    Critical care time (minutes):  45   Critical care was necessary to treat or prevent imminent or life-threatening deterioration of the following  conditions: Arterial occlusion of the left leg.    Critical care was time spent personally by me on the following activities:  Discussions with consultants, evaluation of patient's response to treatment, examination of patient, ordering and performing treatments and interventions, ordering and review of laboratory studies, ordering and review of radiographic studies, pulse oximetry, re-evaluation of patient's condition, obtaining history from patient or surrogate and review of old charts   (including critical care time)  Medications Ordered in ED Medications  iopamidol (ISOVUE-370) 76 % injection (has no administration in time range)  heparin bolus via infusion 4,500 Units (has no administration in time range)  heparin ADULT infusion 100 units/mL (25000 units/234m sodium chloride 0.45%) (has no administration in time  range)  sodium chloride 0.9 % bolus 1,000 mL (1,000 mLs Intravenous New Bag/Given 03/22/18 2019)  morphine 4 MG/ML injection 4 mg (4 mg Intravenous Given 03/22/18 2021)  iopamidol (ISOVUE-370) 76 % injection 100 mL (100 mLs Intravenous Contrast Given 03/22/18 1904)     Initial Impression / Assessment and Plan / ED Course  I have reviewed the triage vital signs and the nursing notes.  Pertinent labs & imaging results that were available during my care of the patient were reviewed by me and considered in my medical decision making (see chart for details).  Clinical Course as of Mar 22 2020  Nancy Fetter Mar 22, 2018  1815 Received call from CT that radiology prefers CT angios aortobifemoral rather than individual CT Angio of lower extremity given that patient does not have trauma.  Order has been changed.   [EH]  2008 Acute occlusive thrombus in left iliac artery and iliac artery and internal is occluded, distal common is almost completely occluded.    [EH]  2017 Dr. Gilford Raid spoke with Dr. Scot Dock    [EH]    Clinical Course User Index [EH] Lorin Glass, PA-C      Patient  presents today for evaluation of 2 days of worsening left leg pain.  On initial exam she has 2+ femoral DP and PT pulses of the right leg, 1+ left femoral pulse, able to Doppler left DP pulse faintly, unable to Doppler left PT pulse.  She has decreased sensation across the bottom of her left foot, and the left foot feels cool compared to the right.  Labs are obtained and reviewed, glucose is elevated at 276.  White count is slightly elevated at 13.5.  She is not anemic.  Potassium is slightly low at 3.4.  hCG is negative.  Pain medicine and 1 L IV fluids were ordered given that she takes metformin.  CT Angio aortobifemoral was ordered which showed concern for acute thrombus in the left common iliac artery with occlusion of the internal iliac artery.  Patient denies any history of recent surgeries, intracranial hemorrhage or GI bleeding.  IV heparin was ordered.  Vascular was consulted.  Dr. Gilford Raid spoke with Dr. Scot Dock who will come see the patient. This patient was a shared visit with Dr. Gilford Raid.   Final Clinical Impressions(s) / ED Diagnoses   Final diagnoses:  Occlusion of left iliac artery [facility])    ED Discharge Orders    None       Ollen Gross 03/22/18 2039    Isla Pence, MD 03/22/18 2057

## 2018-03-22 NOTE — Anesthesia Procedure Notes (Signed)
Procedure Name: Intubation Date/Time: 03/22/2018 9:54 PM Performed by: Valetta Fuller, CRNA Pre-anesthesia Checklist: Patient identified, Emergency Drugs available, Suction available and Patient being monitored Patient Re-evaluated:Patient Re-evaluated prior to induction Oxygen Delivery Method: Circle system utilized Preoxygenation: Pre-oxygenation with 100% oxygen Induction Type: IV induction, Rapid sequence and Cricoid Pressure applied Laryngoscope Size: Miller and 2 Grade View: Grade I Tube type: Oral Tube size: 8.0 mm Number of attempts: 1 Airway Equipment and Method: Stylet Placement Confirmation: ETT inserted through vocal cords under direct vision,  positive ETCO2 and breath sounds checked- equal and bilateral Secured at: 21 cm Tube secured with: Tape Dental Injury: Teeth and Oropharynx as per pre-operative assessment

## 2018-03-22 NOTE — Anesthesia Preprocedure Evaluation (Addendum)
Anesthesia Evaluation  Patient identified by MRN, date of birth, ID band Patient awake    Reviewed: Allergy & Precautions, NPO status , Patient's Chart, lab work & pertinent test results  Airway Mallampati: III  TM Distance: <3 FB Neck ROM: Full    Dental no notable dental hx.    Pulmonary sleep apnea , Current Smoker,    Pulmonary exam normal breath sounds clear to auscultation       Cardiovascular negative cardio ROS Normal cardiovascular exam Rhythm:Regular Rate:Normal     Neuro/Psych negative neurological ROS  negative psych ROS   GI/Hepatic negative GI ROS, (+)     substance abuse  , Ecstasy use   Endo/Other  diabetesMorbid obesity  Renal/GU negative Renal ROS  negative genitourinary   Musculoskeletal negative musculoskeletal ROS (+)   Abdominal   Peds negative pediatric ROS (+)  Hematology negative hematology ROS (+)   Anesthesia Other Findings   Reproductive/Obstetrics negative OB ROS                            Anesthesia Physical Anesthesia Plan  ASA: III and emergent  Anesthesia Plan: General   Post-op Pain Management:    Induction: Intravenous and Rapid sequence  PONV Risk Score and Plan: 2 and Ondansetron and Treatment may vary due to age or medical condition  Airway Management Planned: Oral ETT  Additional Equipment:   Intra-op Plan:   Post-operative Plan: Extubation in OR  Informed Consent: I have reviewed the patients History and Physical, chart, labs and discussed the procedure including the risks, benefits and alternatives for the proposed anesthesia with the patient or authorized representative who has indicated his/her understanding and acceptance.     Dental advisory given  Plan Discussed with: CRNA and Surgeon  Anesthesia Plan Comments:        Anesthesia Quick Evaluation

## 2018-03-22 NOTE — Progress Notes (Signed)
ANTICOAGULATION CONSULT NOTE - Initial Consult  Pharmacy Consult for Heparin Indication: aortic occlusion  No Known Allergies  Patient Measurements: Height: 5' 2"  (157.5 cm) Weight: 205 lb (93 kg) IBW/kg (Calculated) : 50.1 Heparin Dosing Weight: 71.7 kg  Vital Signs: Temp: 98.9 F (37.2 C) (02/23 1736) BP: 132/81 (02/23 1736) Pulse Rate: 97 (02/23 1736)  Labs: Recent Labs    03/22/18 1800 03/22/18 1843  HGB 12.2  --   HCT 37.0  --   PLT 330  --   CREATININE 0.53 0.40*   Estimated Creatinine Clearance: 95.3 mL/min (A) (by C-G formula based on SCr of 0.4 mg/dL (L)).  Assessment: 89 yoF presenting with LLE pain and cold sensation. CTA aortobifemoral significant for acute thrombus in L common iliac artery with focal complete or near complete occlusion at the level of bifurcation. Pharmacy consulted to dose IV heparin. No AC PTA. CBC WNL, no bleeding noted.  Goal of Therapy:  Heparin level 0.3-0.7 units/ml Monitor platelets by anticoagulation protocol: Yes   Plan:  Heparin bolus 4500 units IV x1 Start heparin gtt at 1200 units/hr Check heparin level in 6 hours Daily heparin level and CBC Monitor s/sx of bleeding  Lofton Leon N. Gerarda Fraction, PharmD, [place] PGY2 Infectious Diseases Pharmacy Resident Phone: [phone] 03/22/2018,8:12 PM

## 2018-03-22 NOTE — ED Triage Notes (Signed)
The pt arrived by gems from home  The pt was waiting on the porch  When they aRRIVED.  THE  PT IS C/O LT LOWER LEG PAIN FROM THE LT KNEE DOWN BI-LATERAL PULSES DOPPLERED  FOOT COLOR GOOD  NO KNOWN INJURIES.  LMP  FEB 14TH

## 2018-03-22 NOTE — H&P (Addendum)
REASON FOR CONSULT:    Ischemic left lower extremity.  The consult is requested by the emergency department.  ASSESSMENT & PLAN:   ISCHEMIC LEFT LOWER EXTREMITY: This patient presents with an ischemic left lower extremity and thrombus noted on CT scan in the left common iliac artery and external iliac artery.  She has no history of arrhythmias however she does admit to doing ecstasy.  She notes that sometimes she gets palpitations with this.  In addition it is possible that she has some underlying iliac artery occlusive disease that thrombosed given her history of diabetes and smoking.  She clearly has a limb threatening problem and I have recommended emergent left femoral embolectomy, possible angioplasty and stenting, possible right to left femoral-femoral bypass, and possible fasciotomies.  I have discussed the indications of the procedure and the potential complications.  She understands that without attempted revascularization she is at risk for limb loss.  She understands that the risk of surgery include, but are not limited to, bleeding, wound healing problems, infection, and limb loss.  Given her obesity and diabetes she is at increased risk for wound infection and wound healing problems.  DIABETES: She is on metformin.  We will put her on a sliding scale postoperatively.  TOBACCO ABUSE: She has been counseled on the importance of tobacco cessation.  Deitra Mayo, MD, FACS [facility] [phone] Office: [phone]   HPI:   Megan Riddle is a pleasant 43 y.o. female, who presents with a cold numb left foot.  She is somewhat frightened and anxious and it is somewhat difficult to obtain a history from her.  However, the best I can get from her is that she noted that her left foot felt numb on Friday but cannot remember a specific event.  Regardless the paresthesias persisted and she noted that her foot was pale and cold.  Given that the symptoms persisted she presented tonight to the  emergency department.  Her work-up here included a CT angiogram which showed an embolus to the left common iliac artery and external iliac artery.  For this reason vascular surgery was consulted.  Her risk factors for peripheral vascular disease include diabetes, a family history of premature cardiovascular disease, and tobacco use.  She smokes a half a pack per day currently and is been smoking since she was 43 years old.  She denies any history of hypertension or hypercholesterolemia.  Prior to her foot becoming numb I do not get any clear cut history of claudication, rest pain, or nonhealing ulcers.  The patient does have a history of breast cancer.  Past Medical History:  Diagnosis Date  . Arthritis   . Cancer ([place])    breast  . Diabetes mellitus without complication ([place])    No family history on file.  There is no family history of premature cardiovascular disease.  SOCIAL HISTORY: Social History   Socioeconomic History  . Marital status: Married    Spouse name: Not on file  . Number of children: Not on file  . Years of education: Not on file  . Highest education level: Not on file  Occupational History  . Not on file  Social Needs  . Financial resource strain: Not on file  . Food insecurity:    Worry: Not on file    Inability: Not on file  . Transportation needs:    Medical: Not on file    Non-medical: Not on file  Tobacco Use  . Smoking status: Current Every Day Smoker  Packs/day: 0.50    Years: 27.00    Pack years: 13.50  . Smokeless tobacco: Never Used  . Tobacco comment: patient stated she has the patch and materials  Substance and Sexual Activity  . Alcohol use: Yes  . Drug use: Yes    Types: Marijuana  . Sexual activity: Not on file  Lifestyle  . Physical activity:    Days per week: Not on file    Minutes per session: Not on file  . Stress: Not on file  Relationships  . Social connections:    Talks on phone: Not on file    Gets together: Not on  file    Attends religious service: Not on file    Active member of club or organization: Not on file    Attends meetings of clubs or organizations: Not on file    Relationship status: Not on file  . Intimate partner violence:    Fear of current or ex partner: Not on file    Emotionally abused: Not on file    Physically abused: Not on file    Forced sexual activity: Not on file  Other Topics Concern  . Not on file  Social History Narrative  . Not on file    No Known Allergies  Current Facility-Administered Medications  Medication Dose Route Frequency Provider Last Rate Last Dose  . heparin ADULT infusion 100 units/mL (25000 units/262m sodium chloride 0.45%)  1,200 Units/hr Intravenous Continuous HIsla Pence MD      . heparin bolus via infusion 4,500 Units  4,500 Units Intravenous Once HIsla Pence MD      . iopamidol (ISOVUE-370) 76 % injection            Current Outpatient Medications  Medication Sig Dispense Refill  . Cyanocobalamin (VITAMIN B 12 PO) Take 1 tablet by mouth daily.    .Marland KitchenglipiZIDE (GLUCOTROL) 5 MG tablet Take 1 tablet (5 mg total) by mouth daily before breakfast. (Patient not taking: Reported on 01/02/2018) 90 tablet 0  . lisinopril (PRINIVIL,ZESTRIL) 2.5 MG tablet Take 2.5 mg by mouth daily.  11  . metFORMIN (GLUCOPHAGE) 500 MG tablet Take 2 tablets (1,000 mg total) by mouth 2 (two) times daily. 60 tablet 0  . naproxen (NAPROSYN) 500 MG tablet Take 1 tablet (500 mg total) by mouth 2 (two) times daily. (Patient not taking: Reported on 01/02/2018) 30 tablet 0    REVIEW OF SYSTEMS:  [X]  denotes positive finding, [ ]  denotes negative finding Cardiac  Comments:  Chest pain or chest pressure:    Shortness of breath upon exertion: x   Short of breath when lying flat:    Irregular heart rhythm: x  she reports palpitations after smoking marijuana and sometimes after doing ecstasy.      Vascular    Pain in calf, thigh, or hip brought on by ambulation:    Pain  in feet at night that wakes you up from your sleep:     Blood clot in your veins:    Leg swelling:         Pulmonary    Oxygen at home:    Productive cough:     Wheezing:         Neurologic    Sudden weakness in arms or legs:      Sudden numbness in arms or legs:  x  left foot  Sudden onset of difficulty speaking or slurred speech:    Temporary loss of vision in one eye:  Problems with dizziness:         Gastrointestinal    Blood in stool:     Vomited blood:         Genitourinary    Burning when urinating:     Blood in urine:        Psychiatric    Major depression:         Hematologic    Bleeding problems:    Problems with blood clotting too easily:        Skin    Rashes or ulcers:        Constitutional    Fever or chills:     PHYSICAL EXAM:   Vitals:   03/22/18 1736 03/22/18 1737  BP: 132/81   Pulse: 97   Resp: 18   Temp: 98.9 F (37.2 C)   SpO2: 96%   Weight:  93 kg  Height:  5' 2"  (1.575 m)   Body mass index is 37.49 kg/m.  GENERAL: The patient is an obese female, in no acute distress. The vital signs are documented above. CARDIAC: There is a regular rate and rhythm.  VASCULAR: I do not detect carotid bruits. On the left side, which is the symptomatic side, I cannot palpate a femoral pulse, popliteal pulse, or pedal pulses.  She has a dampened monophasic barely audible posterior tibial signal with the Doppler.  I am unable to obtain an anterior tibial or peroneal signal.  Her her left foot is cool. On the right side she has a palpable femoral pulse, dorsalis pedis pulse, and posterior tibial pulse. She has no significant lower extremity swelling. PULMONARY: There is good air exchange bilaterally without wheezing or rales. ABDOMEN: Soft and non-tender with normal pitched bowel sounds.  I do not palpate an abdominal aortic aneurysm although she is morbidly obese and it is difficult to assess. MUSCULOSKELETAL: There are no major deformities or  cyanosis. NEUROLOGIC: No focal weakness or paresthesias are detected. SKIN: There are no ulcers or rashes noted. PSYCHIATRIC: The patient has a normal affect.  DATA:    CT ANGIOGRAM: I have reviewed the images of her CT angiogram.  This shows an abrupt occlusion of the left common iliac artery, internal iliac artery, and external iliac artery.   LABS: Her creatinine is 0.53.  GFR is greater than 60.  Potassium is 3.4.  White blood cell count 13.5.  Hemoglobin 12.2.  Hematocrit 37.  Platelet counts 330,000.

## 2018-03-23 ENCOUNTER — Encounter (HOSPITAL_COMMUNITY): Payer: Self-pay | Admitting: Vascular Surgery

## 2018-03-23 ENCOUNTER — Inpatient Hospital Stay (HOSPITAL_COMMUNITY): Payer: Medicaid Other

## 2018-03-23 ENCOUNTER — Other Ambulatory Visit: Payer: Self-pay

## 2018-03-23 DIAGNOSIS — I745 Embolism and thrombosis of iliac artery: Secondary | ICD-10-CM | POA: Diagnosis present

## 2018-03-23 DIAGNOSIS — I4891 Unspecified atrial fibrillation: Secondary | ICD-10-CM

## 2018-03-23 LAB — POCT I-STAT 4, (NA,K, GLUC, HGB,HCT)
Glucose, Bld: 154 mg/dL — ABNORMAL HIGH (ref 70–99)
HCT: 28 % — ABNORMAL LOW (ref 36.0–46.0)
HEMOGLOBIN: 9.5 g/dL — AB (ref 12.0–15.0)
Potassium: 3.2 mmol/L — ABNORMAL LOW (ref 3.5–5.1)
Sodium: 140 mmol/L (ref 135–145)

## 2018-03-23 LAB — GLUCOSE, CAPILLARY
Glucose-Capillary: 102 mg/dL — ABNORMAL HIGH (ref 70–99)
Glucose-Capillary: 169 mg/dL — ABNORMAL HIGH (ref 70–99)
Glucose-Capillary: 169 mg/dL — ABNORMAL HIGH (ref 70–99)
Glucose-Capillary: 206 mg/dL — ABNORMAL HIGH (ref 70–99)
Glucose-Capillary: 240 mg/dL — ABNORMAL HIGH (ref 70–99)
Glucose-Capillary: 323 mg/dL — ABNORMAL HIGH (ref 70–99)

## 2018-03-23 LAB — CBC
HCT: 33 % — ABNORMAL LOW (ref 36.0–46.0)
Hemoglobin: 10.6 g/dL — ABNORMAL LOW (ref 12.0–15.0)
MCH: 31.8 pg (ref 26.0–34.0)
MCHC: 32.1 g/dL (ref 30.0–36.0)
MCV: 99.1 fL (ref 80.0–100.0)
Platelets: 310 10*3/uL (ref 150–400)
RBC: 3.33 MIL/uL — ABNORMAL LOW (ref 3.87–5.11)
RDW: 11.7 % (ref 11.5–15.5)
WBC: 12.3 10*3/uL — AB (ref 4.0–10.5)
nRBC: 0 % (ref 0.0–0.2)

## 2018-03-23 LAB — ECHOCARDIOGRAM COMPLETE
Height: 62 in
WEIGHTICAEL: 3276.92 [oz_av]

## 2018-03-23 LAB — HEPARIN LEVEL (UNFRACTIONATED)
Heparin Unfractionated: <0.1 IU/mL — ABNORMAL LOW (ref 0.30–0.70)
Heparin Unfractionated: 0.13 IU/mL — ABNORMAL LOW (ref 0.30–0.70)

## 2018-03-23 LAB — BASIC METABOLIC PANEL
Anion gap: 13 (ref 5–15)
BUN: <5 mg/dL — ABNORMAL LOW (ref 6–20)
CO2: 21 mmol/L — ABNORMAL LOW (ref 22–32)
Calcium: 8.2 mg/dL — ABNORMAL LOW (ref 8.9–10.3)
Chloride: 104 mmol/L (ref 98–111)
Creatinine, Ser: 0.63 mg/dL (ref 0.44–1.00)
GFR calc non Af Amer: >60 mL/min (ref >60)
Glucose, Bld: 257 mg/dL — ABNORMAL HIGH (ref 70–99)
Potassium: 3.2 mmol/L — ABNORMAL LOW (ref 3.5–5.1)
SODIUM: 138 mmol/L (ref 135–145)

## 2018-03-23 LAB — HEMOGLOBIN A1C
Hgb A1c MFr Bld: 9.9 % — ABNORMAL HIGH (ref 4.8–5.6)
Mean Plasma Glucose: 237.43 mg/dL

## 2018-03-23 MED ORDER — PROMETHAZINE HCL 25 MG/ML IJ SOLN: 6.2500 mg | INTRAMUSCULAR | Status: DC | PRN

## 2018-03-23 MED ORDER — PANTOPRAZOLE SODIUM 40 MG PO TBEC
40.0000 mg | DELAYED_RELEASE_TABLET | Freq: Every day | ORAL | Status: DC
  Administered 2018-03-23 – 2018-03-30 (×7): 40 mg via ORAL
  Filled 2018-03-23 (×9): qty 1

## 2018-03-23 MED ORDER — INFLUENZA VAC SPLIT QUAD 0.5 ML IM SUSY
0.5000 mL | PREFILLED_SYRINGE | INTRAMUSCULAR | Status: AC
  Administered 2018-03-24: 0.5 mL via INTRAMUSCULAR
  Filled 2018-03-23: qty 0.5

## 2018-03-23 MED ORDER — SODIUM CHLORIDE 0.9 % IV SOLN
INTRAVENOUS | Status: DC
  Administered 2018-03-23: 02:00:00 via INTRAVENOUS

## 2018-03-23 MED ORDER — ALUM & MAG HYDROXIDE-SIMETH 200-200-20 MG/5ML PO SUSP
15.0000 mL | ORAL | Status: DC | PRN
  Administered 2018-03-23 – 2018-03-25 (×3): 30 mL via ORAL
  Filled 2018-03-23 (×3): qty 30

## 2018-03-23 MED ORDER — PHENOL 1.4 % MT LIQD: 1.0000 | OROMUCOSAL | Status: DC | PRN

## 2018-03-23 MED ORDER — LABETALOL HCL 5 MG/ML IV SOLN: 10.0000 mg | INTRAVENOUS | Status: DC | PRN

## 2018-03-23 MED ORDER — METOPROLOL TARTRATE 5 MG/5ML IV SOLN
2.0000 mg | INTRAVENOUS | Status: DC | PRN
  Administered 2018-03-29: 2 mg via INTRAVENOUS
  Filled 2018-03-23: qty 5

## 2018-03-23 MED ORDER — LISINOPRIL 2.5 MG PO TABS
2.5000 mg | ORAL_TABLET | Freq: Every day | ORAL | Status: DC
  Administered 2018-03-23 – 2018-03-30 (×7): 2.5 mg via ORAL
  Filled 2018-03-23 (×8): qty 1

## 2018-03-23 MED ORDER — GLIPIZIDE 5 MG PO TABS
5.0000 mg | ORAL_TABLET | Freq: Every day | ORAL | Status: DC
  Administered 2018-03-23 – 2018-03-30 (×8): 5 mg via ORAL
  Filled 2018-03-23 (×8): qty 1

## 2018-03-23 MED ORDER — PNEUMOCOCCAL VAC POLYVALENT 25 MCG/0.5ML IJ INJ
0.5000 mL | INJECTION | INTRAMUSCULAR | Status: DC
  Filled 2018-03-23: qty 0.5

## 2018-03-23 MED ORDER — ACETAMINOPHEN 325 MG PO TABS
325.0000 mg | ORAL_TABLET | ORAL | Status: DC | PRN
  Filled 2018-03-23: qty 2

## 2018-03-23 MED ORDER — GUAIFENESIN-DM 100-10 MG/5ML PO SYRP: 15.0000 mL | ORAL_SOLUTION | ORAL | Status: DC | PRN

## 2018-03-23 MED ORDER — OXYCODONE HCL 5 MG PO TABS
5.0000 mg | ORAL_TABLET | ORAL | Status: DC | PRN
  Administered 2018-03-23 – 2018-03-30 (×23): 10 mg via ORAL
  Filled 2018-03-23 (×25): qty 2

## 2018-03-23 MED ORDER — HEPARIN (PORCINE) 25000 UT/250ML-% IV SOLN: 600.0000 [IU]/h | INTRAVENOUS | Status: DC

## 2018-03-23 MED ORDER — HYDROMORPHONE HCL 1 MG/ML IJ SOLN: 0.2500 mg | INTRAMUSCULAR | Status: DC | PRN

## 2018-03-23 MED ORDER — ONDANSETRON HCL 4 MG/2ML IJ SOLN: 4.0000 mg | Freq: Four times a day (QID) | INTRAMUSCULAR | Status: DC | PRN

## 2018-03-23 MED ORDER — MAGNESIUM SULFATE 2 GM/50ML IV SOLN: 2.0000 g | Freq: Every day | INTRAVENOUS | Status: DC | PRN

## 2018-03-23 MED ORDER — INSULIN ASPART 100 UNIT/ML ~~LOC~~ SOLN
0.0000 [IU] | Freq: Three times a day (TID) | SUBCUTANEOUS | Status: DC
  Administered 2018-03-23: 7 [IU] via SUBCUTANEOUS
  Administered 2018-03-23: 3 [IU] via SUBCUTANEOUS
  Administered 2018-03-24: 1 [IU] via SUBCUTANEOUS
  Administered 2018-03-24: 3 [IU] via SUBCUTANEOUS
  Administered 2018-03-24 – 2018-03-25 (×2): 2 [IU] via SUBCUTANEOUS
  Administered 2018-03-25: 3 [IU] via SUBCUTANEOUS
  Administered 2018-03-25 – 2018-03-26 (×4): 2 [IU] via SUBCUTANEOUS
  Administered 2018-03-27: 3 [IU] via SUBCUTANEOUS
  Administered 2018-03-27: 2 [IU] via SUBCUTANEOUS
  Administered 2018-03-28 (×2): 3 [IU] via SUBCUTANEOUS
  Administered 2018-03-30 (×2): 2 [IU] via SUBCUTANEOUS

## 2018-03-23 MED ORDER — SODIUM CHLORIDE 0.9 % IV SOLN: 500.0000 mL | Freq: Once | INTRAVENOUS | Status: DC | PRN

## 2018-03-23 MED ORDER — SENNOSIDES-DOCUSATE SODIUM 8.6-50 MG PO TABS: 1.0000 | ORAL_TABLET | Freq: Every evening | ORAL | Status: DC | PRN

## 2018-03-23 MED ORDER — ACETAMINOPHEN 325 MG RE SUPP: 325.0000 mg | RECTAL | Status: DC | PRN

## 2018-03-23 MED ORDER — BISACODYL 5 MG PO TBEC: 5.0000 mg | DELAYED_RELEASE_TABLET | Freq: Every day | ORAL | Status: DC | PRN

## 2018-03-23 MED ORDER — HYDRALAZINE HCL 20 MG/ML IJ SOLN: 5.0000 mg | INTRAMUSCULAR | Status: DC | PRN

## 2018-03-23 MED ORDER — DOCUSATE SODIUM 100 MG PO CAPS
100.0000 mg | ORAL_CAPSULE | Freq: Every day | ORAL | Status: DC
  Administered 2018-03-23 – 2018-03-27 (×5): 100 mg via ORAL
  Filled 2018-03-23 (×7): qty 1

## 2018-03-23 MED ORDER — HEPARIN (PORCINE) 25000 UT/250ML-% IV SOLN
1700.0000 [IU]/h | INTRAVENOUS | Status: DC
  Administered 2018-03-23: 1400 [IU]/h via INTRAVENOUS
  Administered 2018-03-23: 1200 [IU]/h via INTRAVENOUS
  Administered 2018-03-24: 1700 [IU]/h via INTRAVENOUS
  Filled 2018-03-23 (×2): qty 250

## 2018-03-23 MED ORDER — HYDROMORPHONE HCL 1 MG/ML IJ SOLN
0.5000 mg | INTRAMUSCULAR | Status: DC | PRN
  Administered 2018-03-24: 0.5 mg via INTRAVENOUS
  Administered 2018-03-25 – 2018-03-28 (×15): 1 mg via INTRAVENOUS
  Filled 2018-03-23 (×16): qty 1

## 2018-03-23 MED ORDER — CEFAZOLIN SODIUM-DEXTROSE 2-4 GM/100ML-% IV SOLN
2.0000 g | Freq: Three times a day (TID) | INTRAVENOUS | Status: AC
  Administered 2018-03-23 (×2): 2 g via INTRAVENOUS
  Filled 2018-03-23 (×2): qty 100

## 2018-03-23 MED ORDER — POTASSIUM CHLORIDE CRYS ER 20 MEQ PO TBCR
20.0000 meq | EXTENDED_RELEASE_TABLET | Freq: Every day | ORAL | Status: AC | PRN
  Administered 2018-03-23: 40 meq via ORAL
  Filled 2018-03-23: qty 2

## 2018-03-23 NOTE — Transfer of Care (Signed)
Immediate Anesthesia Transfer of Care Note  Patient: Vayda Hines-Mister  Procedure(s) Performed: LEFT ILIAC ARTERTY EMBOLECTOMY (Left Leg Upper) PATCH ANGIOPLASTY OF LEFT FEMORAL ARTERY (Left ) Intra Operative Arteriogram LEFT LOWER LEG (Left )  Patient Location: PACU  Anesthesia Type:General  Level of Consciousness: sedated and patient cooperative  Airway & Oxygen Therapy: Patient connected to face mask oxygen  Post-op Assessment: Report given to RN and Post -op Vital signs reviewed and stable  Post vital signs: Reviewed and stable  Last Vitals:  Vitals Value Taken Time  BP    Temp    Pulse    Resp    SpO2      Last Pain:  Vitals:   03/22/18 1737  PainSc: 8          Complications: No apparent anesthesia complications

## 2018-03-23 NOTE — Evaluation (Signed)
Physical Therapy Evaluation Patient Details Name: Megan Riddle MRN: [id number] DOB: March 07, 1975 Today's Date: 03/23/2018   History of Present Illness  ISCHEMIC LEFT LOWER EXTREMITY: This patient presents with an ischemic left lower extremity and thrombus noted on CT scan in the left common iliac artery and external iliac artery.  Pt s/p left iliofemoral thrombectomy, Vein patch angioplasty of the left common femoral artery with saphenous vein graft    Clinical Impression  Pt admitted with above diagnosis. Pt currently with functional limitations due to the deficits listed below (see PT Problem List). PTA pt lived at home with her wife, independent with mobility. On eval, she required mod assist bed mobility, min assist sit to stand and min assist ambulation 100 feet with RW. +2 assist utilized for safety/lines during mobility. Pt will benefit from skilled PT to increase their independence and safety with mobility to allow discharge to the venue listed below.      Follow Up Recommendations Home health PT;Supervision for mobility/OOB    Equipment Recommendations  Rolling walker with 5" wheels;3in1 (PT)    Recommendations for Other Services       Precautions / Restrictions Precautions Precautions: Fall Restrictions Weight Bearing Restrictions: No      Mobility  Bed Mobility Overal bed mobility: Needs Assistance Bed Mobility: Supine to Sit;Rolling Rolling: Min assist   Supine to sit: HOB elevated;Mod assist     General bed mobility comments: +rail, cues for sequencing, increased time and effort, use of bed pad to scoot to EOB  Transfers Overall transfer level: Needs assistance Equipment used: Rolling walker (2 wheeled) Transfers: Sit to/from Stand Sit to Stand: Min assist;+2 safety/equipment         General transfer comment: cues for hand placement, assist to power up, increased time and effort  Ambulation/Gait Ambulation/Gait assistance: Min assist;+2  safety/equipment Gait Distance (Feet): 100 Feet Assistive device: Rolling walker (2 wheeled) Gait Pattern/deviations: Step-through pattern;Decreased stride length;Antalgic;Decreased weight shift to left Gait velocity: decreased Gait velocity interpretation: <1.31 ft/sec, indicative of household ambulator General Gait Details: cues for RW management and sequencing  Stairs            Wheelchair Mobility    Modified Rankin (Stroke Patients Only)       Balance Overall balance assessment: Needs assistance Sitting-balance support: Feet supported;Bilateral upper extremity supported Sitting balance-Leahy Scale: Poor Sitting balance - Comments: pt could not tolerate sitting EOB without UE support   Standing balance support: During functional activity;Single extremity supported Standing balance-Leahy Scale: Poor Standing balance comment: standing at sink to wash hand                             Pertinent Vitals/Pain Pain Assessment: 0-10 Pain Score: 9  Pain Location: LLe surgical site during ambulation Pain Descriptors / Indicators: Operative site guarding;Grimacing Pain Intervention(s): Monitored during session;Repositioned;Premedicated before session    Home Living Family/patient expects to be discharged to:: Private residence Living Arrangements: Spouse/significant other Available Help at Discharge: Family Type of Home: House Home Access: Stairs to enter Entrance Stairs-Rails: None Technical brewer of Steps: 2 Home Layout: Multi-level Home Equipment: None Additional Comments: wife works 3rd shift    Prior Function Level of Independence: Independent               Hand Dominance   Dominant Hand: Right    Extremity/Trunk Assessment   Upper Extremity Assessment Upper Extremity Assessment: Overall WFL for tasks assessed    Lower Extremity  Assessment Lower Extremity Assessment: LLE deficits/detail LLE: Unable to fully assess due to pain     Cervical / Trunk Assessment Cervical / Trunk Assessment: Normal  Communication   Communication: No difficulties  Cognition Arousal/Alertness: Awake/alert Behavior During Therapy: WFL for tasks assessed/performed Overall Cognitive Status: Within Functional Limits for tasks assessed                                        General Comments      Exercises General Exercises - Lower Extremity Ankle Circles/Pumps: AROM;Both;10 reps   Assessment/Plan    PT Assessment Patient needs continued PT services  PT Problem List Decreased balance;Pain;Decreased mobility;Decreased knowledge of use of DME;Decreased activity tolerance       PT Treatment Interventions DME instruction;Functional mobility training;Balance training;Patient/family education;Gait training;Therapeutic activities;Stair training;Therapeutic exercise    PT Goals (Current goals can be found in the Care Plan section)  Acute Rehab PT Goals Patient Stated Goal: did not state PT Goal Formulation: With patient/family Time For Goal Achievement: 04/06/18 Potential to Achieve Goals: Good    Frequency Min 3X/week   Barriers to discharge        Co-evaluation PT/OT/SLP Co-Evaluation/Treatment: Yes Reason for Co-Treatment: For patient/therapist safety;To address functional/ADL transfers PT goals addressed during session: Mobility/safety with mobility;Proper use of DME;Balance OT goals addressed during session: ADL's and self-care;Strengthening/ROM       AM-PAC PT "6 Clicks" Mobility  Outcome Measure Help needed turning from your back to your side while in a flat bed without using bedrails?: A Little Help needed moving from lying on your back to sitting on the side of a flat bed without using bedrails?: A Little Help needed moving to and from a bed to a chair (including a wheelchair)?: A Little Help needed standing up from a chair using your arms (e.g., wheelchair or bedside chair)?: A Little Help needed  to walk in hospital room?: A Little Help needed climbing 3-5 steps with a railing? : A Lot 6 Click Score: 17    End of Session Equipment Utilized During Treatment: Gait belt Activity Tolerance: Patient tolerated treatment well Patient left: in chair;with call bell/phone within reach;with family/visitor present Nurse Communication: Mobility status PT Visit Diagnosis: Difficulty in walking, not elsewhere classified (R26.2);Pain Pain - Right/Left: Left Pain - part of body: Leg    Time: 0932-3557 PT Time Calculation (min) (ACUTE ONLY): 41 min   Charges:   PT Evaluation $PT Eval Moderate Complexity: 1 Mod          Lorrin Goodell, PT  Office # [phone] Pager [phone]   Lorriane Shire 03/23/2018, 10:49 AM

## 2018-03-23 NOTE — Progress Notes (Addendum)
Inpatient Diabetes Program Recommendations  AACE/ADA: New Consensus Statement on Inpatient Glycemic Control (2015)  Target Ranges:  Prepandial:   less than 140 mg/dL      Peak postprandial:   less than 180 mg/dL (1-2 hours)      Critically ill patients:  140 - 180 mg/dL    Review of Glycemic Control  Diabetes history: DM 2 Outpatient Diabetes medications: Metformin 1000 mg BID Current orders for Inpatient glycemic control: Glipizide 5 mg Daily, Novolog 0-9 units tid  Spoke with patient about diabetes and home regimen for diabetes control. Patient reports that she is followed by her PCP for diabetes management. Patient reports her last A1c was maybe around an 11% but then stated it was around a 9% after I informed her of her current A1c of 9.9%. Patient with partner at bedside and she reports patient eating full plates of rice and snacking on junk food. Discussed with patient and encouraged her to reduce her portion sizes. once in awhile patient's partner says she has 1 bottle orange soda. Educated extensively on diet and portion sizes. Patient's partner reports patient was not checking her glucose levels at home, however patient saud she was checking twice a day and her levels were in the lower 100 level. Her A1c reflects a higher average.   Discussed importance of checking CBGs and maintaining good CBG control to prevent long-term and short-term complications. Explained how hyperglycemia leads to damage within blood vessels which lead to the common complications seen with uncontrolled diabetes. Stressed to the patient the importance of improving glycemic control to prevent further complications from uncontrolled diabetes. Discussed impact of nutrition, exercise, stress, sickness, and medications on diabetes control.Patient and partner verbalized understanding of information discussed and have no further questions at this time related to diabetes.   Based on current glucose trends consider  increasing Novolog correction scale to 0-15 units tid and add Novolog 0-5 units qhs. May need low dose basal insulin if glucose trends continue to rise.   Thanks,  Tama Headings RN, MSN, BC-ADM Inpatient Diabetes Coordinator Team Pager [phone] (8a-5p)

## 2018-03-23 NOTE — Progress Notes (Signed)
Pt arrived to the floor with the PACU nurses, alert and drowsy. CHG bath given, Heart monitor applied, CCMD notified. VSS we'll continue to monitor.

## 2018-03-23 NOTE — Progress Notes (Signed)
ANTICOAGULATION CONSULT NOTE   Pharmacy Consult for Heparin Indication: aortic occlusion  No Known Allergies  Patient Measurements: Height: 5' 2"  (157.5 cm) Weight: 204 lb 12.9 oz (92.9 kg) IBW/kg (Calculated) : 50.1 Heparin Dosing Weight: 71.7 kg  Vital Signs: Temp: 99 F (37.2 C) (02/24 0750) Temp Source: Oral (02/24 0750) BP: 103/66 (02/24 0750) Pulse Rate: 80 (02/24 0750)  Labs: Recent Labs    03/22/18 1800 03/22/18 1843 03/22/18 2248 03/23/18 0240 03/23/18 0737  HGB 12.2  --  9.5* 10.6*  --   HCT 37.0  --  28.0* 33.0*  --   PLT 330  --   --  310  --   HEPARINUNFRC  --   --   --   --  <0.10*  CREATININE 0.53 0.40*  --  0.63  --    Estimated Creatinine Clearance: 95.2 mL/min (by C-G formula based on SCr of 0.63 mg/dL).  Assessment: 78 yoF presenting with LLE pain and cold sensation. CTA aortobifemoral significant for acute thrombus in L common iliac artery with focal complete or near complete occlusion at the level of bifurcation. Pharmacy consulted to dose IV heparin. No AC PTA. CBC WNL, no bleeding noted. Post op thrombectomy heparin to increase to full dose at this morning at 02:00.  Level post adjustment was undetectable, may need more time to achieve steady state, but will increase rate slightly and recheck tonight.   Goal of Therapy:  Heparin level 0.3-0.7 units/ml Monitor platelets by anticoagulation protocol: Yes   Plan:  Increase IV Heparin to 1400 units/hr  Check heparin level in 8 hours Daily heparin level and CBC Monitor s/sx of bleeding  Thanks for allowing pharmacy to be a part of this patient's care.  Erin Hearing PharmD., BCPS Clinical Pharmacist 03/23/2018 9:46 AM

## 2018-03-23 NOTE — Progress Notes (Addendum)
[facility] [place]  Subjective  - Doing OK this am, wants to know when breakfast is coming.   Objective 126/88 85 98.6 F (37 C) (Oral) (!) 21 100%  Intake/Output Summary (Last 24 hours) at 03/23/2018 0711 Last data filed at 03/23/2018 1287 Gross per 24 hour  Intake 1364.06 ml  Output 565 ml  Net 799.06 ml    Doppler signals PT/DP/Peroneal Incisions soft without hematoma, JP drain in place 35 cc out put Heart RRR Lungs non labored breathing  Assessment/Planning: POD # 1  1.  Left iliofemoral thrombectomy 2.  Vein patch angioplasty of the left common femoral artery with saphenous vein graft 3.  Intraoperative arteriogram  Pending Echo  Full dose heparin Currently patent flow with doppler signals Pathology pending for specimen thrombus. Cr and HGB stable post op.   Roxy Horseman 03/23/2018 7:11 AM --  I have interviewed the patient and examined the patient. I agree with the findings by the PA. 2D echo pending Path on the clot is pending Continue intravenous heparin for now. Discontinue JP when drainage is less than 30 cc for 1 shift.  Gae Gallop, MD [phone]   Laboratory Lab Results: Recent Labs    03/22/18 1800 03/23/18 0240  WBC 13.5* 12.3*  HGB 12.2 10.6*  HCT 37.0 33.0*  PLT 330 310   BMET Recent Labs    03/22/18 1800 03/22/18 1843 03/23/18 0240  NA 135  --  138  K 3.4*  --  3.2*  CL 102  --  104  CO2 22  --  21*  GLUCOSE 276*  --  257*  BUN <5*  --  <5*  CREATININE 0.53 0.40* 0.63  CALCIUM 9.4  --  8.2*    COAG No results found for: INR, PROTIME No results found for: PTT

## 2018-03-23 NOTE — Progress Notes (Signed)
ANTICOAGULATION CONSULT NOTE   Pharmacy Consult for Heparin Indication: aortic occlusion  No Known Allergies  Patient Measurements: Height: 5' 2"  (157.5 cm) Weight: 204 lb 12.9 oz (92.9 kg) IBW/kg (Calculated) : 50.1 Heparin Dosing Weight: 71.7 kg  Vital Signs: Temp: 98.7 F (37.1 C) (02/24 1643) Temp Source: Oral (02/24 1643) BP: 108/66 (02/24 1643) Pulse Rate: 77 (02/24 1643)  Labs: Recent Labs    03/22/18 1800 03/22/18 1843 03/22/18 2248 03/23/18 0240 03/23/18 0737 03/23/18 1820  HGB 12.2  --  9.5* 10.6*  --   --   HCT 37.0  --  28.0* 33.0*  --   --   PLT 330  --   --  310  --   --   HEPARINUNFRC  --   --   --   --  <0.10* 0.13*  CREATININE 0.53 0.40*  --  0.63  --   --    Estimated Creatinine Clearance: 95.2 mL/min (by C-G formula based on SCr of 0.63 mg/dL).  Assessment: 21 yoF presenting with LLE pain and cold sensation. CTA aortobifemoral significant for acute thrombus in L common iliac artery with focal complete or near complete occlusion at the level of bifurcation. Pharmacy consulted to dose IV heparin. No AC PTA.  She is now post op thrombectomy heparin to increased to full dose this morning -heparin level=  0.13  Goal of Therapy:  Heparin level 0.3-0.7 units/ml Monitor platelets by anticoagulation protocol: Yes   Plan:  Increase IV Heparin to 1600 units/hr  Check heparin level in 8 hours Daily heparin level and CBC  Hildred Laser, PharmD Clinical Pharmacist **Pharmacist phone directory can now be found on [email or web address] ([email or web address]).  Listed under [place].

## 2018-03-23 NOTE — Plan of Care (Signed)
POC initiated and progressing. 

## 2018-03-23 NOTE — Plan of Care (Signed)
  Problem: Education: Goal: Knowledge of the prescribed therapeutic regimen will improve Outcome: Progressing   Problem: Bowel/Gastric: Goal: Gastrointestinal status for postoperative course will improve Outcome: Progressing

## 2018-03-23 NOTE — Progress Notes (Signed)
Pt c/o foot being cold again, Dr. Scot Dock notified. He come up to the floor and assesses the Patient. We'll continue to monitor.

## 2018-03-23 NOTE — Progress Notes (Addendum)
[facility] for Heparin Indication: aortic occlusion  No Known Allergies  Patient Measurements: Height: 5' 2"  (157.5 cm) Weight: 204 lb 12.9 oz (92.9 kg) IBW/kg (Calculated) : 50.1 Heparin Dosing Weight: 71.7 kg  Vital Signs: Temp: 98.6 F (37 C) (02/24 0058) Temp Source: Oral (02/24 0058) BP: 127/81 (02/24 0058) Pulse Rate: 78 (02/24 0058)  Labs: Recent Labs    03/22/18 1800 03/22/18 1843  HGB 12.2  --   HCT 37.0  --   PLT 330  --   CREATININE 0.53 0.40*   Estimated Creatinine Clearance: 95.2 mL/min (A) (by C-G formula based on SCr of 0.4 mg/dL (L)).  Assessment: 46 yoF presenting with LLE pain and cold sensation. CTA aortobifemoral significant for acute thrombus in L common iliac artery with focal complete or near complete occlusion at the level of bifurcation. Pharmacy consulted to dose IV heparin. No AC PTA. CBC WNL, no bleeding noted. Post op thrombectomy heparin to increase to full dose at 02:00  Goal of Therapy:  Heparin level 0.3-0.7 units/ml Monitor platelets by anticoagulation protocol: Yes   Plan:  Heparin gtt at 1200 units/hr at 02:00 Check heparin level in 6 hours Daily heparin level and CBC Monitor s/sx of bleeding  Thanks for allowing pharmacy to be a part of this patient's care.  Excell Seltzer, PharmD Clinical Pharmacist

## 2018-03-23 NOTE — Evaluation (Signed)
Occupational Therapy Evaluation Patient Details Name: Megan Riddle MRN: [id number] DOB: 27-Dec-1975 Today's Date: 03/23/2018    History of Present Illness ISCHEMIC LEFT LOWER EXTREMITY: This patient presents with an ischemic left lower extremity and thrombus noted on CT scan in the left common iliac artery and external iliac artery.  Pt s/p left iliofemoral thrombectomy, Vein patch angioplasty of the left common femoral artery with saphenous vein graft   Clinical Impression   This 43 yo female admitted and underwent above presents to acute OT with increased pain LLE ("different from former pain"), decreased AROM of LLE, decrease balance all affecting her safety and independence with basic ADLs. She will benefit from acute OT without need for follow up post D/C.    Follow Up Recommendations  No OT follow up;Supervision/Assistance - 24 hour    Equipment Recommendations  3 in 1 bedside commode;Tub/shower bench       Precautions / Restrictions Precautions Precautions: Fall Restrictions Weight Bearing Restrictions: No      Mobility Bed Mobility Overal bed mobility: Needs Assistance Bed Mobility: Supine to Sit;Rolling Rolling: Min assist   Supine to sit: HOB elevated;Mod assist     General bed mobility comments: +rail, cues for sequencing, increased time and effort, use of bed pad to scoot to EOB  Transfers Overall transfer level: Needs assistance Equipment used: Rolling walker (2 wheeled) Transfers: Sit to/from Stand Sit to Stand: Min assist;+2 safety/equipment         General transfer comment: cues for hand placement, assist to power up, increased time and effort    Balance Overall balance assessment: Needs assistance Sitting-balance support: Feet supported;Bilateral upper extremity supported Sitting balance-Leahy Scale: Poor Sitting balance - Comments: pt could not tolerate sitting EOB without UE support   Standing balance support: During functional  activity;Single extremity supported Standing balance-Leahy Scale: Poor Standing balance comment: standing at sink to wash hand                           ADL either performed or assessed with clinical judgement   ADL Overall ADL's : Needs assistance/impaired Eating/Feeding: Independent;Sitting   Grooming: Set up;Sitting   Upper Body Bathing: Set up;Sitting   Lower Body Bathing: Moderate assistance Lower Body Bathing Details (indicate cue type and reason): Mod A +2 sit<>stand Upper Body Dressing : Set up;Sitting   Lower Body Dressing: Moderate assistance Lower Body Dressing Details (indicate cue type and reason): Mod A +2 sit<>stand Toilet Transfer: Minimal assistance;Ambulation;RW   Toileting- Clothing Manipulation and Hygiene: Minimal assistance;Sit to/from stand         General ADL Comments: Spoke to pt and wife about using 3n1 in shower stall (however shower stall is a corner stall and they don't believe it will fit. We are going with a tub bench for the 2nd bath that has a tub/shower combo.     Vision Patient Visual Report: No change from baseline              Pertinent Vitals/Pain Pain Assessment: 0-10 Pain Score: 9  Pain Location: LLe surgical site during ambulation Pain Descriptors / Indicators: Operative site guarding;Grimacing Pain Intervention(s): Monitored during session;Repositioned;Premedicated before session     Hand Dominance Right   Extremity/Trunk Assessment Upper Extremity Assessment Upper Extremity Assessment: Overall WFL for tasks assessed   Lower Extremity Assessment Lower Extremity Assessment: LLE deficits/detail LLE: Unable to fully assess due to pain   Cervical / Trunk Assessment Cervical / Trunk Assessment: Normal  Communication Communication Communication: No difficulties   Cognition Arousal/Alertness: Awake/alert Behavior During Therapy: WFL for tasks assessed/performed Overall Cognitive Status: Within Functional  Limits for tasks assessed                                        Exercises General Exercises - Lower Extremity Ankle Circles/Pumps: AROM;Both;10 reps        Home Living Family/patient expects to be discharged to:: Private residence Living Arrangements: Spouse/significant other Available Help at Discharge: Family Type of Home: House Home Access: Stairs to enter Technical brewer of Steps: 2 Entrance Stairs-Rails: None Home Layout: Multi-level Alternate Level Stairs-Number of Steps: 2 Alternate Level Stairs-Rails: None Bathroom Shower/Tub: Tub/shower unit;Walk-in shower   Bathroom Toilet: Standard Bathroom Accessibility: No   Home Equipment: None   Additional Comments: wife works 3rd shift      Prior Functioning/Environment Level of Independence: Independent                 OT Problem List: Decreased strength;Decreased range of motion;Impaired balance (sitting and/or standing);Pain;Decreased knowledge of use of DME or AE      OT Treatment/Interventions: Self-care/ADL training;Balance training;DME and/or AE instruction;Patient/family education    OT Goals(Current goals can be found in the care plan section) Acute Rehab OT Goals Patient Stated Goal: did not state OT Goal Formulation: With patient Time For Goal Achievement: 04/06/18 Potential to Achieve Goals: Good  OT Frequency: Min 2X/week           Co-evaluation PT/OT/SLP Co-Evaluation/Treatment: Yes Reason for Co-Treatment: For patient/therapist safety;To address functional/ADL transfers PT goals addressed during session: Mobility/safety with mobility;Proper use of DME;Balance OT goals addressed during session: ADL's and self-care;Strengthening/ROM      AM-PAC OT "6 Clicks" Daily Activity     Outcome Measure Help from another person eating meals?: None Help from another person taking care of personal grooming?: A Little Help from another person toileting, which includes using  toliet, bedpan, or urinal?: A Little Help from another person bathing (including washing, rinsing, drying)?: A Lot Help from another person to put on and taking off regular upper body clothing?: A Little Help from another person to put on and taking off regular lower body clothing?: A Lot 6 Click Score: 17   End of Session Equipment Utilized During Treatment: Gait belt;Rolling walker Nurse Communication: Mobility status(NT)  Activity Tolerance: Patient tolerated treatment well Patient left: in chair;with call bell/phone within reach  OT Visit Diagnosis: Unsteadiness on feet (R26.81);Other abnormalities of gait and mobility (R26.89);Pain;Muscle weakness (generalized) (M62.81) Pain - Right/Left: Left Pain - part of body: Leg                Time: 9450-3888 OT Time Calculation (min): 52 min Charges:  OT General Charges $OT Visit: 1 Visit OT Evaluation $OT Eval Moderate Complexity: 1 Mod OT Treatments $Self Care/Home Management : 8-22 mins  [place] Circle, OTR/L Acute [employer] Pager [phone] Office [phone]     Almon Register 03/23/2018, 10:48 AM

## 2018-03-23 NOTE — Progress Notes (Signed)
  Echocardiogram 2D Echocardiogram has been performed.  Megan Riddle 03/23/2018, 10:48 AM

## 2018-03-24 LAB — CBC
HCT: 29.8 % — ABNORMAL LOW (ref 36.0–46.0)
Hemoglobin: 10 g/dL — ABNORMAL LOW (ref 12.0–15.0)
MCH: 33.3 pg (ref 26.0–34.0)
MCHC: 33.6 g/dL (ref 30.0–36.0)
MCV: 99.3 fL (ref 80.0–100.0)
NRBC: 0 % (ref 0.0–0.2)
Platelets: 311 10*3/uL (ref 150–400)
RBC: 3 MIL/uL — ABNORMAL LOW (ref 3.87–5.11)
RDW: 11.9 % (ref 11.5–15.5)
WBC: 10.6 10*3/uL — ABNORMAL HIGH (ref 4.0–10.5)

## 2018-03-24 LAB — GLUCOSE, CAPILLARY
GLUCOSE-CAPILLARY: 236 mg/dL — AB (ref 70–99)
Glucose-Capillary: 229 mg/dL — ABNORMAL HIGH (ref 70–99)
Glucose-Capillary: 180 mg/dL — ABNORMAL HIGH (ref 70–99)
Glucose-Capillary: 124 mg/dL — ABNORMAL HIGH (ref 70–99)

## 2018-03-24 LAB — HEPARIN LEVEL (UNFRACTIONATED): Heparin Unfractionated: 0.29 IU/mL — ABNORMAL LOW (ref 0.30–0.70)

## 2018-03-24 MED ORDER — RIVAROXABAN 20 MG PO TABS
20.0000 mg | ORAL_TABLET | Freq: Every day | ORAL | Status: DC
  Administered 2018-03-24: 20 mg via ORAL
  Filled 2018-03-24: qty 1

## 2018-03-24 MED ORDER — NITROGLYCERIN 0.4 MG SL SUBL
SUBLINGUAL_TABLET | SUBLINGUAL | Status: AC
  Administered 2018-03-24: 0.4 mg
  Filled 2018-03-24: qty 1

## 2018-03-24 MED ORDER — RIVAROXABAN 20 MG PO TABS: 20.0000 mg | ORAL_TABLET | Freq: Every day | ORAL | Status: DC

## 2018-03-24 MED ORDER — RIVAROXABAN 15 MG PO TABS
15.0000 mg | ORAL_TABLET | Freq: Two times a day (BID) | ORAL | Status: DC
  Administered 2018-03-24 – 2018-03-25 (×2): 15 mg via ORAL
  Filled 2018-03-24 (×2): qty 1

## 2018-03-24 MED ORDER — OXYCODONE HCL 5 MG PO TABS: 5.0000 mg | ORAL_TABLET | Freq: Four times a day (QID) | ORAL | 0 refills | Status: DC | PRN

## 2018-03-24 MED ORDER — RIVAROXABAN (XARELTO) VTE STARTER PACK (15 & 20 MG): ORAL_TABLET | ORAL | 0 refills | Status: DC

## 2018-03-24 MED ORDER — RIVAROXABAN 20 MG PO TABS: 20.0000 mg | ORAL_TABLET | Freq: Every day | ORAL | 2 refills | Status: DC

## 2018-03-24 MED ORDER — RIVAROXABAN (XARELTO) EDUCATION KIT FOR DVT/PE PATIENTS
PACK | Freq: Once | Status: AC
  Administered 2018-03-24: 18:00:00
  Filled 2018-03-24: qty 1

## 2018-03-24 MED FILL — oxyCODONE HCL 5 MG TABS: 5 | 5 days supply | Qty: 30 | Fill #0

## 2018-03-24 NOTE — Progress Notes (Signed)
ANTICOAGULATION CONSULT NOTE   Pharmacy Consult for Heparin Indication: aortic occlusion  No Known Allergies  Patient Measurements: Height: 5' 2"  (157.5 cm) Weight: 204 lb 12.9 oz (92.9 kg) IBW/kg (Calculated) : 50.1 Heparin Dosing Weight: 71.7 kg  Vital Signs: Temp: 98.4 F (36.9 C) (02/25 0414) Temp Source: Oral (02/25 0414) BP: 107/75 (02/25 0414) Pulse Rate: 71 (02/25 0414)  Labs: Recent Labs    03/22/18 1800 03/22/18 1843 03/22/18 2248 03/23/18 0240 03/23/18 0737 03/23/18 1820 03/24/18 0516  HGB 12.2  --  9.5* 10.6*  --   --  10.0*  HCT 37.0  --  28.0* 33.0*  --   --  29.8*  PLT 330  --   --  310  --   --  311  HEPARINUNFRC  --   --   --   --  <0.10* 0.13* 0.29*  CREATININE 0.53 0.40*  --  0.63  --   --   --    Estimated Creatinine Clearance: 95.2 mL/min (by C-G formula based on SCr of 0.63 mg/dL).  Assessment: 57 yoF presenting with LLE pain and cold sensation. CTA aortobifemoral significant for acute thrombus in L common iliac artery with focal complete or near complete occlusion at the level of bifurcation. Pharmacy consulted to dose IV heparin. No AC PTA.  She is now post op thrombectomy -heparin level=  0.29  Goal of Therapy:  Heparin level 0.3-0.7 units/ml Monitor platelets by anticoagulation protocol: Yes   Plan:  Increase IV Heparin to 1700 units/hr  Check heparin level in 8 hours Daily heparin level and CBC  Thanks for allowing pharmacy to be a part of this patient's care.  Excell Seltzer, PharmD Clinical Pharmacist

## 2018-03-24 NOTE — Progress Notes (Signed)
Inpatient Diabetes Program Recommendations  AACE/ADA: New Consensus Statement on Inpatient Glycemic Control  Target Ranges:  Prepandial:   less than 140 mg/dL      Peak postprandial:   less than 180 mg/dL (1-2 hours)      Critically ill patients:  140 - 180 mg/dL   Results for Megan Riddle, Megan Riddle (MRN [id number]) as of 03/24/2018 11:00  Ref. Range 03/23/2018 06:13 03/23/2018 11:24 03/23/2018 16:11 03/23/2018 21:47 03/24/2018 06:45  Glucose-Capillary Latest Ref Range: 70 - 99 mg/dL 323 (H) 206 (H) 102 (H) 240 (H) 229 (H)  Results for Megan Riddle, Megan Riddle (MRN [id number]) as of 03/24/2018 11:00  Ref. Range 03/23/2018 02:40  Hemoglobin A1C Latest Ref Range: 4.8 - 5.6 % 9.9 (H)   Review of Glycemic Control  Diabetes history: DM2 Outpatient Diabetes medications: Metformin 1000 mg BID, Glipizide 5 mg QAM (per home med list was not taking Glipizide) Current orders for Inpatient glycemic control: Glipizide 5 mg AM, Novolog 0-9 units TID with meals  Inpatient Diabetes Program Recommendations:   Correction (SSI): Bedtime glucose 240 mg/dl last night but no insulin given since none ordered. Fasting glucose 229 mg/dl.  Please consider ordering Novolog 0-5 units QHS for bedtime correction scale.  Insulin - Meal Coverage: If post prandial glucose continues to be greater than 180 mg/dl, please consider ordering Novolog 3 units TID with meals for meal coverage if patient eats at least 50% of meals.  HgbA1C: A1C 9.9% on 03/23/18. Diabetes Coordinator spoke with patient on 03/23/18 (see note for details). Patient needs to take DM medications as prescribed and follow up with PCP regarding DM control.  Thanks, Barnie Alderman, RN, MSN, CDE Diabetes Coordinator Inpatient Diabetes Program [phone] (Team Pager from 8am to 5pm)

## 2018-03-24 NOTE — Progress Notes (Addendum)
  [facility]   VASCULAR SURGERY ASSESSMENT & PLAN:   EMBOLIC EVENT TO LEFT LOWER EXTREMITY: The thrombus looks somewhat suspicious.  Pathology is pending.  It is still not clear exactly why she embolized although it could potentially be related to a cardiac event as she was doing cocaine.  Given that the etiology of this is not clear I think we should start her on oral anticoagulation and I have consulted pharmacy for Xarelto.  If she cannot afford this we may need to consider alternatives.  Her echo did not show a source for embolization.  LYMPHATIC DRAINAGE FROM JP: Her JP drained 65 cc / 75 cc for the last 2 shifts.  We will need to continue this for now.  Continue Praveena dressing on left groin.  Mobilize.  I have interviewed the patient and examined the patient. I agree with the findings by the PA.  Gae Gallop, MD [phone]     Subjective  - Doing better over all.  Objective 110/66 68 98.2 F (36.8 C) (Oral) 17 100%  Intake/Output Summary (Last 24 hours) at 03/24/2018 0753 Last data filed at 03/24/2018 0616 Gross per 24 hour  Intake 1342.11 ml  Output 4040 ml  Net -2697.89 ml   Doppler DP/PT/peroneal left LE Left groin soft with incisional vac in place.  JP drain in place 140 cc out put.  Watery drainage currently in bulb. Heart RRR Lungs non labored breathing Echo IMPRESSIONS  1. The left ventricle has hyperdynamic systolic function, with an ejection fraction of >65%. The cavity size was normal. Left ventricular diastolic parameters were normal.  2. The right ventricle has normal systolic function. The cavity was normal. There is no increase in right ventricular wall thickness.  3. The mitral valve is normal in structure.  4. The tricuspid valve is normal in structure.  5. The aortic valve is tricuspid Mild thickening of the aortic valve.  6. The aortic root, ascending aorta and aortic arch are normal in size and  structure.  Assessment/Planning: POD # 2  1.Left iliofemoral thrombectomy 2.Vein patch angioplasty of the left common femoral artery with saphenous vein graft 3.Intraoperative arteriogram  Will maintain JP and incisional vac Encourage mobility Pending pathology on thrombus  Continue Heparin full dose possible NOAC for home will discuss with DR. Dickson  HGB stable, hypokalemia 3.2 will replenish K+ Cr stable and not elevated.   Roxy Horseman 03/24/2018 7:53 AM --  Laboratory Lab Results: Recent Labs    03/23/18 0240 03/24/18 0516  WBC 12.3* 10.6*  HGB 10.6* 10.0*  HCT 33.0* 29.8*  PLT 310 311   BMET Recent Labs    03/22/18 1800 03/22/18 1843 03/22/18 2248 03/23/18 0240  NA 135  --  140 138  K 3.4*  --  3.2* 3.2*  CL 102  --   --  104  CO2 22  --   --  21*  GLUCOSE 276*  --  154* 257*  BUN <5*  --   --  <5*  CREATININE 0.53 0.40*  --  0.63  CALCIUM 9.4  --   --  8.2*    COAG No results found for: INR, PROTIME No results found for: PTT

## 2018-03-24 NOTE — Progress Notes (Addendum)
Patient and her partner got into an argument yelling at each other in the room, security called to the room as they continued to argue, patient's partner left the room with the patient's phone before security arrived. Patient notified  the police department using the hospital room phone. Hospital security and this staff gave the patient  the option to be made a triple X and transferred to another room, patient refused stating that she has no concern about her safety, she stated ''she wont come back here and harm me''. Patient very tearful, saying that she wants to leave AMA, Laurence Slate PA notified.

## 2018-03-24 NOTE — Progress Notes (Signed)
Occupational Therapy Treatment Patient Details Name: Megan Riddle MRN: [id number] DOB: Jan 28, 1976 Today's Date: 03/24/2018    History of present illness ISCHEMIC LEFT LOWER EXTREMITY: This patient presents with an ischemic left lower extremity and thrombus noted on CT scan in the left common iliac artery and external iliac artery.  Pt s/p left iliofemoral thrombectomy, Vein patch angioplasty of the left common femoral artery with saphenous vein graft   OT comments  Pt progressing towards established OT goals. Pt performing home distance functional mobility with Min Guard A and RW. Pt performing toileting and hand hygiene with Supervision-Min Guard A. Educating pt and wife on AE for LB ADLs; pt verbalized and demonstrated understanding. Will continue to follow acutely as admitted. Continue to recommend dc home once medically stable.   Follow Up Recommendations  No OT follow up;Supervision/Assistance - 24 hour    Equipment Recommendations  3 in 1 bedside commode;Tub/shower bench    Recommendations for Other Services      Precautions / Restrictions Precautions Precautions: Fall Restrictions Weight Bearing Restrictions: No       Mobility Bed Mobility               General bed mobility comments: OOB in bathroom upon arrival  Transfers Overall transfer level: Needs assistance Equipment used: Rolling walker (2 wheeled) Transfers: Sit to/from Stand Sit to Stand: Min guard;Supervision         General transfer comment: Supervision-Min Guard A for safety    Balance Overall balance assessment: Needs assistance Sitting-balance support: Feet supported;No upper extremity supported Sitting balance-Leahy Scale: Fair     Standing balance support: During functional activity;Single extremity supported Standing balance-Leahy Scale: Fair                             ADL either performed or assessed with clinical judgement   ADL Overall ADL's : Needs  assistance/impaired     Grooming: Set up;Supervision/safety;Standing;Wash/dry hands         Lower Body Bathing Details (indicate cue type and reason): Educating on AE for LB bathing     Lower Body Dressing: Min guard;With adaptive equipment;Cueing for sequencing;Sit to/from stand Lower Body Dressing Details (indicate cue type and reason): Educating pt on AE for LB dressing including pants, socks, and shoes. Pt doffing/donning socks.  Toilet Transfer: Min guard;Ambulation;RW;Regular Glass blower/designer Details (indicate cue type and reason): Upon arrival, pt in bathroom with wife and finishing toileting         Functional mobility during ADLs: Min guard;Supervision/safety;Rolling walker General ADL Comments: Requiring increased time and effort throughout     Vision       Perception     Praxis      Cognition Arousal/Alertness: Awake/alert Behavior During Therapy: WFL for tasks assessed/performed Overall Cognitive Status: Within Functional Limits for tasks assessed                                          Exercises     Shoulder Instructions       General Comments Wife present throughout    Pertinent Vitals/ Pain       Pain Assessment: Faces Faces Pain Scale: Hurts even more Pain Location: LLe surgical site during ambulation Pain Descriptors / Indicators: Operative site guarding;Grimacing Pain Intervention(s): Monitored during session;Repositioned;Limited activity within patient's tolerance  Home Living  Prior Functioning/Environment              Frequency  Min 2X/week        Progress Toward Goals  OT Goals(current goals can now be found in the care plan section)  Progress towards OT goals: Progressing toward goals  Acute Rehab OT Goals Patient Stated Goal: did not state OT Goal Formulation: With patient Time For Goal Achievement: 04/06/18 Potential to Achieve Goals:  Good ADL Goals Pt Will Perform Lower Body Dressing: with modified independence;with adaptive equipment;sit to/from stand Pt Will Transfer to Toilet: with modified independence;ambulating;bedside commode(over toilet) Pt Will Perform Toileting - Clothing Manipulation and hygiene: with modified independence;sit to/from stand Pt Will Perform Tub/Shower Transfer: (pt/wife will verbalize how to use tub bench/shower curtain AND positioning for 3n1 if shower stall) Additional ADL Goal #1: pt will be Mod I in and OOB for basic ADLs with HOB flat and no rail (just increased time)  Plan Discharge plan remains appropriate    Co-evaluation                 AM-PAC OT "6 Clicks" Daily Activity     Outcome Measure   Help from another person eating meals?: None Help from another person taking care of personal grooming?: A Little Help from another person toileting, which includes using toliet, bedpan, or urinal?: A Little Help from another person bathing (including washing, rinsing, drying)?: A Lot Help from another person to put on and taking off regular upper body clothing?: A Little Help from another person to put on and taking off regular lower body clothing?: A Lot 6 Click Score: 17    End of Session Equipment Utilized During Treatment: Rolling walker  OT Visit Diagnosis: Unsteadiness on feet (R26.81);Other abnormalities of gait and mobility (R26.89);Pain;Muscle weakness (generalized) (M62.81) Pain - Right/Left: Left Pain - part of body: Leg   Activity Tolerance Patient tolerated treatment well   Patient Left in chair;with call bell/phone within reach;with family/visitor present   Nurse Communication Mobility status        Time: 3005-1102 OT Time Calculation (min): 26 min  Charges: OT General Charges $OT Visit: 1 Visit OT Treatments $Self Care/Home Management : 23-37 mins  [place], OTR/L Acute Rehab Pager: [phone] Office: [place] 03/24/2018, 9:01 AM

## 2018-03-24 NOTE — Progress Notes (Signed)
Patient still talking on the phone no sign of distress noted, RN attempted to do a pain reassessment but pt continues to talk on the phone without responding to this RN will monitor.

## 2018-03-24 NOTE — Progress Notes (Addendum)
[place] for Heparin>>xarelto Indication: aortic occlusion/embolus  No Known Allergies  Patient Measurements: Height: 5' 2"  (157.5 cm) Weight: 204 lb 12.9 oz (92.9 kg) IBW/kg (Calculated) : 50.1 Heparin Dosing Weight: 71.7 kg  Vital Signs: Temp: 98.2 F (36.8 C) (02/25 0713) Temp Source: Oral (02/25 0713) BP: 110/66 (02/25 0713) Pulse Rate: 68 (02/25 0713)  Labs: Recent Labs    03/22/18 1800 03/22/18 1843 03/22/18 2248 03/23/18 0240 03/23/18 0737 03/23/18 1820 03/24/18 0516  HGB 12.2  --  9.5* 10.6*  --   --  10.0*  HCT 37.0  --  28.0* 33.0*  --   --  29.8*  PLT 330  --   --  310  --   --  311  HEPARINUNFRC  --   --   --   --  <0.10* 0.13* 0.29*  CREATININE 0.53 0.40*  --  0.63  --   --   --    Estimated Creatinine Clearance: 95.2 mL/min (by C-G formula based on SCr of 0.63 mg/dL).  Assessment: 29 yoF presenting with LLE pain and cold sensation. CTA aortobifemoral significant for acute thrombus in L common iliac artery with focal complete or near complete occlusion at the level of bifurcation. Pharmacy consulted to dose IV heparin. No AC PTA.  She is now post op thrombectomy.  Heparin level was just below goal this morning, rate adjusted. New orders to transition to xarelto. Will stop heparin at noon and at that time start xarelto. Case management has been consulted to determine copay information.   Goal of Therapy:  Monitor platelets by anticoagulation protocol: Yes   Plan:  Stop heparin at noon Xarelto 28m daily  Thanks for allowing pharmacy to be a part of this patient's care.  FErin HearingPharmD., BCPS Clinical Pharmacist 03/24/2018 8:46 AM  Addendum:  After discussion with vascular PA will increase to DVT treatment dose xarelto. 211mreceived this morning, will start 1580mid tonight for 21days then 4m80mily on 3/17 pm.   FranErin HearingrmD., BCPS Clinical Pharmacist 03/24/2018 3:11 PM
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
## 2018-03-24 NOTE — Progress Notes (Signed)
Patient c/o of a sharp chest pain 9/10 vita signs ar stable see flowsheet, EKG obtained showed NSR, patient laying in the bed speaking on the phone, no sign of distress noted 1 nitro given will continue to monitor.

## 2018-03-24 NOTE — Progress Notes (Addendum)
Darrick Meigs number was no response. Pt then remembered another sister and cited this sister's phone # [phone]. Pt now able to speak to this other sister. Her name is Megan Riddle. She provided other sister Megan Riddle [phone].  Assisted pt with this call.

## 2018-03-24 NOTE — Progress Notes (Signed)
Both Eliquis and Xarelto are on Medicaid preferred drug list and covered under Medicaid at $3.90. Pt can use 30 day free card at discharge for either drug.

## 2018-03-24 NOTE — Progress Notes (Signed)
Patient ambulated twice in the hall on this shift ambulation well tolerated.

## 2018-03-24 NOTE — Progress Notes (Addendum)
Physical Therapy Treatment Patient Details Name: Megan Riddle MRN: [id number] DOB: 1976-01-04 Today's Date: 03/24/2018    History of Present Illness ISCHEMIC LEFT LOWER EXTREMITY: This patient presents with an ischemic left lower extremity and thrombus noted on CT scan in the left common iliac artery and external iliac artery.  Pt s/p left iliofemoral thrombectomy, Vein patch angioplasty of the left common femoral artery with saphenous vein graft    PT Comments    Pt making steady progress. Plans to return home with wife at DC   Follow Up Recommendations  Home health PT;Supervision for mobility/OOB     Equipment Recommendations  Rolling walker with 5" wheels;3in1 (PT)    Recommendations for Other Services       Precautions / Restrictions Precautions Precautions: Fall Restrictions Weight Bearing Restrictions: No    Mobility  Bed Mobility               General bed mobility comments: Amb in room with wife  Transfers                 General transfer comment: Pt amb in room with wife  Ambulation/Gait Ambulation/Gait assistance: Supervision Gait Distance (Feet): 250 Feet Assistive device: Rolling walker (2 wheeled) Gait Pattern/deviations: Step-through pattern;Decreased stride length;Antalgic;Decreased weight shift to left Gait velocity: decreased Gait velocity interpretation: <1.31 ft/sec, indicative of household ambulator General Gait Details: Assist for safety   Stairs Stairs: Yes Stairs assistance: Min assist Stair Management: Step to pattern;Forwards Number of Stairs: 1(1 x 2) General stair comments: Performed on portable step and used hand held on left and walker handle on rt   Wheelchair Mobility    Modified Rankin (Stroke Patients Only)       Balance Overall balance assessment: Needs assistance Sitting-balance support: Feet supported;No upper extremity supported Sitting balance-Leahy Scale: Good     Standing balance support: No  upper extremity supported;During functional activity Standing balance-Leahy Scale: Fair                              Cognition Arousal/Alertness: Awake/alert Behavior During Therapy: WFL for tasks assessed/performed Overall Cognitive Status: Within Functional Limits for tasks assessed                                        Exercises      General Comments        Pertinent Vitals/Pain Pain Assessment: 0-10 Pain Score: 7  Pain Location: LLE surgical site Pain Descriptors / Indicators: Operative site guarding;Tightness;Sore Pain Intervention(s): Limited activity within patient's tolerance;Monitored during session    Home Living                      Prior Function            PT Goals (current goals can now be found in the care plan section) Progress towards PT goals: Progressing toward goals    Frequency    Min 3X/week      PT Plan Current plan remains appropriate    Co-evaluation              AM-PAC PT "6 Clicks" Mobility   Outcome Measure  Help needed turning from your back to your side while in a flat bed without using bedrails?: A Little Help needed moving from lying on your back to sitting on the side  of a flat bed without using bedrails?: A Little Help needed moving to and from a bed to a chair (including a wheelchair)?: A Little Help needed standing up from a chair using your arms (e.g., wheelchair or bedside chair)?: A Little Help needed to walk in hospital room?: A Little Help needed climbing 3-5 steps with a railing? : A Lot 6 Click Score: 17    End of Session   Activity Tolerance: Patient tolerated treatment well Patient left: with call bell/phone within reach;with family/visitor present;in bed(sitting EOB) Nurse Communication: Mobility status PT Visit Diagnosis: Difficulty in walking, not elsewhere classified (R26.2);Pain Pain - Right/Left: Left Pain - part of body: Leg     Time: 1135-1157 PT Time  Calculation (min) (ACUTE ONLY): 22 min  Charges:  $Gait Training: 8-22 mins                     [place] Pager [phone] Office [place] 03/24/2018, 2:47 PM

## 2018-03-25 ENCOUNTER — Inpatient Hospital Stay (HOSPITAL_COMMUNITY): Payer: Medicaid Other

## 2018-03-25 LAB — CBC
HCT: 29.4 % — ABNORMAL LOW (ref 36.0–46.0)
Hemoglobin: 9.6 g/dL — ABNORMAL LOW (ref 12.0–15.0)
MCH: 32.1 pg (ref 26.0–34.0)
MCHC: 32.7 g/dL (ref 30.0–36.0)
MCV: 98.3 fL (ref 80.0–100.0)
Platelets: 318 10*3/uL (ref 150–400)
RBC: 2.99 MIL/uL — ABNORMAL LOW (ref 3.87–5.11)
RDW: 11.7 % (ref 11.5–15.5)
WBC: 11.4 10*3/uL — ABNORMAL HIGH (ref 4.0–10.5)
nRBC: 0 % (ref 0.0–0.2)

## 2018-03-25 LAB — HEPARIN LEVEL (UNFRACTIONATED): Heparin Unfractionated: 1.62 IU/mL — ABNORMAL HIGH (ref 0.30–0.70)

## 2018-03-25 LAB — BASIC METABOLIC PANEL
Anion gap: 7 (ref 5–15)
BUN: 5 mg/dL — ABNORMAL LOW (ref 6–20)
CO2: 27 mmol/L (ref 22–32)
Calcium: 9.1 mg/dL (ref 8.9–10.3)
Chloride: 102 mmol/L (ref 98–111)
Creatinine, Ser: 0.63 mg/dL (ref 0.44–1.00)
Glucose, Bld: 243 mg/dL — ABNORMAL HIGH (ref 70–99)
Potassium: 3.7 mmol/L (ref 3.5–5.1)
Sodium: 136 mmol/L (ref 135–145)

## 2018-03-25 LAB — GLUCOSE, CAPILLARY
Glucose-Capillary: 209 mg/dL — ABNORMAL HIGH (ref 70–99)
Glucose-Capillary: 174 mg/dL — ABNORMAL HIGH (ref 70–99)
Glucose-Capillary: 221 mg/dL — ABNORMAL HIGH (ref 70–99)

## 2018-03-25 LAB — APTT: aPTT: 33 seconds (ref 24–36)

## 2018-03-25 MED ORDER — HEPARIN (PORCINE) 25000 UT/250ML-% IV SOLN
2200.0000 [IU]/h | INTRAVENOUS | Status: AC
  Administered 2018-03-25: 1750 [IU]/h via INTRAVENOUS
  Administered 2018-03-26: 1900 [IU]/h via INTRAVENOUS
  Administered 2018-03-26 – 2018-03-27 (×2): 2200 [IU]/h via INTRAVENOUS
  Filled 2018-03-25 (×4): qty 250

## 2018-03-25 MED FILL — XARELTO STARTER PACK: 15 & 20 | 30 days supply | Qty: 51 | Fill #0

## 2018-03-25 NOTE — Progress Notes (Addendum)
[facility] VASCULAR SURGERY ASSESSMENT & PLAN:   EMBOLIC EVENT LEFT LOWER EXTREMITY: She has a biphasic peroneal signal with the Doppler and a monophasic posterior tibial and dorsalis pedis signal.  She is now on Xarelto.  PERSISTENT LYMPHATIC DRAINAGE FROM JP: Her JP has drained 200 cc and 190 cc for the last 2 shifts.  This has increased from yesterday.  LEFT FOOT PAIN: She has had pain on the dorsum of her left foot long before surgery.  She states that there is a protrusion of the bone there and I think she is already seen a podiatrist.  I have again told her that this would have to be followed up as an outpatient.  However we will follow-up on her x-ray of the left foot.  ANTICOAGULATION: She is now on Xarelto.  But I will need to hold this as if her lymphatic drainage persists she may need to be returned to the operating room for exploration of her left groin.  For this reason I will restart her heparin.  Deitra Mayo, MD, FACS Beeper [phone] Office: [phone]   Subjective  - Pain in the left foot that is preventing her from ambulating.  Objective 133/82 78 97.8 F (36.6 C) (Oral) (!) 21 97%  Intake/Output Summary (Last 24 hours) at 03/25/2018 0742 Last data filed at 03/25/2018 0610 Gross per 24 hour  Intake 1471.06 ml  Output 790 ml  Net 681.06 ml   Doppler brisk signals PT/DP/peroneal left LE Groin soft, incisional vac in place.JP drain in place with clear fluid output 390 cc total.  Lyph fluid.  Heart RRR without new chest pain  Lungs non labored breathing  Assessment/Planning: POD # 3  1.Left iliofemoral thrombectomy 2.Vein patch angioplasty of the left common femoral artery with saphenous vein graft 3.Intraoperative arteriogram  She apparently has a lyph fluid leak will maintain drain for now. Maintain incisional vac as well to Protec the groin incision. I ordered an x ray of her left foot she has sever tenderness to  palpation on the dorsum first MTT joint.   HGB 9.6 from 10 stable asymptomatic She was started on Xarelto yesterday will order labs for am.    Roxy Horseman 03/25/2018 7:42 AM --  Laboratory Lab Results: Recent Labs    03/24/18 0516 03/25/18 0313  WBC 10.6* 11.4*  HGB 10.0* 9.6*  HCT 29.8* 29.4*  PLT 311 318   BMET Recent Labs    03/23/18 0240 03/25/18 0313  NA 138 136  K 3.2* 3.7  CL 104 102  CO2 21* 27  GLUCOSE 257* 243*  BUN <5* 5*  CREATININE 0.63 0.63  CALCIUM 8.2* 9.1    COAG No results found for: INR, PROTIME No results found for: PTT
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
## 2018-03-25 NOTE — Anesthesia Postprocedure Evaluation (Signed)
Anesthesia Post Note  Patient: Megan Riddle  Procedure(s) Performed: LEFT ILIAC ARTERTY EMBOLECTOMY (Left Leg Upper) PATCH ANGIOPLASTY OF LEFT FEMORAL ARTERY (Left ) Intra Operative Arteriogram LEFT LOWER LEG (Left )     Patient location during evaluation: PACU Anesthesia Type: General Level of consciousness: awake and alert Pain management: pain level controlled Vital Signs Assessment: post-procedure vital signs reviewed and stable Respiratory status: spontaneous breathing, nonlabored ventilation, respiratory function stable and patient connected to nasal cannula oxygen Cardiovascular status: blood pressure returned to baseline and stable Postop Assessment: no apparent nausea or vomiting Anesthetic complications: no    Last Vitals:  Vitals:   03/24/18 2311 03/25/18 0318  BP: 102/76 133/82  Pulse: 75 78  Resp: 19 (!) 21  Temp: (!) 36.4 C 36.6 C  SpO2: 97% 97%    Last Pain:  Vitals:   03/25/18 0318  TempSrc: Oral  PainSc: 9                  Ashir Kunz S
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
## 2018-03-25 NOTE — Progress Notes (Signed)
Inpatient Diabetes Program Recommendations  AACE/ADA: New Consensus Statement on Inpatient Glycemic Control (2015)  Target Ranges:  Prepandial:   less than 140 mg/dL      Peak postprandial:   less than 180 mg/dL (1-2 hours)      Critically ill patients:  140 - 180 mg/dL   Results for Megan Riddle, Megan Riddle (MRN [id number]) as of 03/25/2018 11:00  Ref. Range 03/24/2018 06:45 03/24/2018 12:02 03/24/2018 15:58 03/24/2018 22:26 03/25/2018 06:13  Glucose-Capillary Latest Ref Range: 70 - 99 mg/dL 229 (H) 180 (H) 124 (H) 236 (H) 209 (H)   Results for Megan Riddle, Megan Riddle (MRN [id number]) as of 03/25/2018 11:00  Ref. Range 03/23/2018 02:40  Hemoglobin A1C Latest Ref Range: 4.8 - 5.6 % 9.9 (H)   Review of Glycemic Control  Diabetes history: DM2 Outpatient Diabetes medications: Metformin 1000 mg BID, Glipizide 5 mg QAM (per home med list was not taking Glipizide) Current orders for Inpatient glycemic control: Glipizide 5 mg AM, Novolog 0-9 units TID with meals  Inpatient Diabetes Program Recommendations:   Correction (SSI): Bedtime glucose 236 mg/dl last night but no insulin given since none ordered. Fasting glucose 209 mg/dl.  Please consider ordering Novolog 0-5 units QHS for bedtime correction scale.  Insulin - Meal Coverage: If post prandial glucose continues to be greater than 180 mg/dl, please consider ordering Novolog 3 units TID with meals for meal coverage if patient eats at least 50% of meals.  HgbA1C: A1C 9.9% on 03/23/18. Diabetes Coordinator spoke with patient on 03/23/18 (see note for details). Patient needs to take DM medications as prescribed and follow up with PCP regarding DM control.  Thanks, Barnie Alderman, RN, MSN, CDE Diabetes Coordinator Inpatient Diabetes Program [phone] (Team Pager from 8am to 5pm)

## 2018-03-25 NOTE — Progress Notes (Signed)
ANTICOAGULATION CONSULT NOTE  Pharmacy Consult:  Xarelto >> Heparin Indication:  Aortic occlusion/embolus  No Known Allergies  Patient Measurements: Height: 5' 2"  (157.5 cm) Weight: 204 lb 12.9 oz (92.9 kg) IBW/kg (Calculated) : 50.1 Heparin Dosing Weight: 72 kg  Vital Signs: Temp: 97.8 F (36.6 C) (02/26 0318) Temp Source: Oral (02/26 0318) BP: 133/82 (02/26 0318) Pulse Rate: 78 (02/26 0318)  Labs: Recent Labs    03/22/18 1843  03/23/18 0240 03/23/18 0737 03/23/18 1820 03/24/18 0516 03/25/18 0313  HGB  --    < > 10.6*  --   --  10.0* 9.6*  HCT  --    < > 33.0*  --   --  29.8* 29.4*  PLT  --   --  310  --   --  311 318  HEPARINUNFRC  --   --   --  <0.10* 0.13* 0.29*  --   CREATININE 0.40*  --  0.63  --   --   --  0.63   < > = values in this interval not displayed.   Estimated Creatinine Clearance: 95.2 mL/min (by C-G formula based on SCr of 0.63 mg/dL).  Assessment: 17 yoF presenting with LLE pain and cold sensation. CTA aortobifemoral significant for acute thrombus in left common iliac artery with focal complete or near complete occlusion at the level of bifurcation.  She is s/p thrombectomy on 03/22/18.  Patient was started on IV heparin on admit, then transitioned to Xarelto on 03/24/18 (last dose 2/26 at 0739).  She has persistent lymphatic drainage requiring OR visit, so Pharmacy consulted to restart IV heparin while Xarelto is on hold.  Will likely use aPTT to guide heparin dosing as Xarelto may falsely elevate heparin levels.  CBC stable; no bleeding reported.  Goal of Therapy:  Heparin level 0.3-0.7 units/mL APTT 66-102 sec Monitor platelets by anticoagulation protocol: Yes   Plan:  Check baseline heparin level and aPTT At 2000, start IV heparin gtt at 1750 units/hr based on previous trend, no bolus Check 6 hr aPTT and heparin level Daily heparin level, aPTT and CBC Consider increasing SSI to moderate  Roy Snuffer D. Mina Marble, PharmD, BCPS, [place] 03/25/2018, 8:29  AM

## 2018-03-26 ENCOUNTER — Telehealth: Payer: Self-pay | Admitting: Vascular Surgery

## 2018-03-26 ENCOUNTER — Encounter (HOSPITAL_COMMUNITY): Payer: Self-pay | Admitting: Certified Registered Nurse Anesthetist

## 2018-03-26 LAB — GLUCOSE, CAPILLARY
Glucose-Capillary: 182 mg/dL — ABNORMAL HIGH (ref 70–99)
Glucose-Capillary: 156 mg/dL — ABNORMAL HIGH (ref 70–99)
Glucose-Capillary: 197 mg/dL — ABNORMAL HIGH (ref 70–99)
Glucose-Capillary: 231 mg/dL — ABNORMAL HIGH (ref 70–99)

## 2018-03-26 LAB — BASIC METABOLIC PANEL
Anion gap: 10 (ref 5–15)
BUN: 6 mg/dL (ref 6–20)
CO2: 26 mmol/L (ref 22–32)
Calcium: 9.4 mg/dL (ref 8.9–10.3)
Chloride: 100 mmol/L (ref 98–111)
Creatinine, Ser: 0.56 mg/dL (ref 0.44–1.00)
Glucose, Bld: 194 mg/dL — ABNORMAL HIGH (ref 70–99)
Potassium: 3.9 mmol/L (ref 3.5–5.1)
Sodium: 136 mmol/L (ref 135–145)

## 2018-03-26 LAB — APTT
aPTT: 53 s — ABNORMAL HIGH (ref 24–36)
aPTT: 56 s — ABNORMAL HIGH (ref 24–36)
aPTT: 51 seconds — ABNORMAL HIGH (ref 24–36)

## 2018-03-26 LAB — CBC
HCT: 32.6 % — ABNORMAL LOW (ref 36.0–46.0)
Hemoglobin: 10.7 g/dL — ABNORMAL LOW (ref 12.0–15.0)
MCH: 32 pg (ref 26.0–34.0)
MCHC: 32.8 g/dL (ref 30.0–36.0)
MCV: 97.6 fL (ref 80.0–100.0)
Platelets: 388 K/uL (ref 150–400)
RBC: 3.34 MIL/uL — ABNORMAL LOW (ref 3.87–5.11)
RDW: 11.6 % (ref 11.5–15.5)
WBC: 13 K/uL — ABNORMAL HIGH (ref 4.0–10.5)
nRBC: 0 % (ref 0.0–0.2)

## 2018-03-26 LAB — HEPARIN LEVEL (UNFRACTIONATED): Heparin Unfractionated: 0.76 [IU]/mL — ABNORMAL HIGH (ref 0.30–0.70)

## 2018-03-26 MED ORDER — SODIUM CHLORIDE 0.9 % IV SOLN: 1.5000 g | INTRAVENOUS | Status: DC

## 2018-03-26 MED ORDER — HEPARIN BOLUS VIA INFUSION
2000.0000 [IU] | Freq: Once | INTRAVENOUS | Status: AC
  Administered 2018-03-26: 2000 [IU] via INTRAVENOUS
  Filled 2018-03-26: qty 2000

## 2018-03-26 NOTE — Progress Notes (Addendum)
Physical Therapy Treatment Patient Details Name: Megan Riddle MRN: [id number] DOB: [birth year] Today's Date: 03/26/2018    History of Present Illness ISCHEMIC LEFT LOWER EXTREMITY: This patient presents with an ischemic left lower extremity and thrombus noted on CT scan in the left common iliac artery and external iliac artery.  Pt s/p left iliofemoral thrombectomy, Vein patch angioplasty of the left common femoral artery with saphenous vein graft    PT Comments    Pt c/o lt foot pain. Pt required extended amount of time to mobilize due to pain. Initially pt didn't feel she could amb but then able to amb 325'.    Follow Up Recommendations  Home health PT;Supervision - Intermittent     Equipment Recommendations  Rolling walker with 5" wheels;3in1 (PT)    Recommendations for Other Services       Precautions / Restrictions Precautions Precautions: Fall Restrictions Weight Bearing Restrictions: No    Mobility  Bed Mobility               General bed mobility comments: Up in chair  Transfers Overall transfer level: Modified independent Equipment used: Rolling walker (2 wheeled);None Transfers: Sit to/from Stand Sit to Stand: Modified independent (Device/Increase time)         General transfer comment: Pt with incr time due to pain in lt foot  Ambulation/Gait Ambulation/Gait assistance: Supervision Gait Distance (Feet): 325 Feet Assistive device: Rolling walker (2 wheeled) Gait Pattern/deviations: Step-through pattern;Antalgic;Decreased weight shift to left;Trunk flexed;Decreased step length - right;Decreased step length - left Gait velocity: Very slow Gait velocity interpretation: <1.31 ft/sec, indicative of household ambulator General Gait Details: Assist for safety/lines. Pt initially unable to get foot flat and with trunk flexed over walker. Verbal cues to stand more erect. Pt with improved posture and foot flat as distance incr   Stairs              Wheelchair Mobility    Modified Rankin (Stroke Patients Only)       Balance Overall balance assessment: Needs assistance Sitting-balance support: Feet supported;No upper extremity supported Sitting balance-Leahy Scale: Good     Standing balance support: No upper extremity supported;During functional activity Standing balance-Leahy Scale: Fair                              Cognition Arousal/Alertness: Awake/alert Behavior During Therapy: WFL for tasks assessed/performed Overall Cognitive Status: Within Functional Limits for tasks assessed                                        Exercises Other Exercises Other Exercises: Instructed pt in assisted calf/ankle stretch using sheet around bottom of foot and pulling foot into dorsiflexion.     General Comments        Pertinent Vitals/Pain Pain Assessment: 0-10 Faces Pain Scale: Hurts whole lot Pain Location: Lt foot and groin Pain Descriptors / Indicators: Tightness;Throbbing Pain Intervention(s): Limited activity within patient's tolerance;Monitored during session;Premedicated before session;Repositioned    Home Living                      Prior Function            PT Goals (current goals can now be found in the care plan section) Acute Rehab PT Goals PT Goal Formulation: With patient Time For Goal Achievement: 04/02/18 Potential to Achieve  Goals: Good Progress towards PT goals: Goals met and updated - see care plan    Frequency    Min 3X/week      PT Plan Current plan remains appropriate    Co-evaluation              AM-PAC PT "6 Clicks" Mobility   Outcome Measure  Help needed turning from your back to your side while in a flat bed without using bedrails?: None Help needed moving from lying on your back to sitting on the side of a flat bed without using bedrails?: None Help needed moving to and from a bed to a chair (including a wheelchair)?: A Little Help  needed standing up from a chair using your arms (e.g., wheelchair or bedside chair)?: A Little Help needed to walk in hospital room?: A Little Help needed climbing 3-5 steps with a railing? : A Little 6 Click Score: 20    End of Session   Activity Tolerance: Patient tolerated treatment well Patient left: with call bell/phone within reach;in chair(sitting EOB) Nurse Communication: Mobility status PT Visit Diagnosis: Difficulty in walking, not elsewhere classified (R26.2);Pain Pain - Right/Left: Left Pain - part of body: Ankle and joints of foot;Leg     Time: 1030-1116 PT Time Calculation (min) (ACUTE ONLY): 46 min  Charges:  $Gait Training: 38-52 mins                     [place] Pager [phone] Office [place] 03/26/2018, 1:43 PM
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
## 2018-03-26 NOTE — Progress Notes (Signed)
Patient continues to c/o pain in left leg and foot. Patient stated that, "she hasn't felt anything like this before". Patient was given 10 oxy and 1 mg dilaudid about 30 minutes afterwards. Patient's foot is warmer now than at shift change and upon assessment. Doppler used for pulses. Patient was repositioned, foot elevated on pillows, resting. Will continue to monitor

## 2018-03-26 NOTE — Progress Notes (Signed)
Educated patient multiple times all day, to wait for help to the bathroom and to ambulate in the patient's room. The patient verbalized understanding the consequences and potential outcomes if she were to fall. The patient has continued to be non-compliant.

## 2018-03-26 NOTE — Progress Notes (Signed)
Inpatient Diabetes Program Recommendations  AACE/ADA: New Consensus Statement on Inpatient Glycemic Control  Target Ranges:  Prepandial:   less than 140 mg/dL      Peak postprandial:   less than 180 mg/dL (1-2 hours)      Critically ill patients:  140 - 180 mg/dL   Results for MELVINA, PANGELINAN (MRN [id number]) as of 03/26/2018 10:40  Ref. Range 03/25/2018 06:13 03/25/2018 11:12 03/25/2018 22:09 03/26/2018 06:20  Glucose-Capillary Latest Ref Range: 70 - 99 mg/dL 209 (H) 174 (H) 221 (H) 182 (H)   Results for JAZARAH, CAPILI (MRN [id number]) as of 03/25/2018 11:00  Ref. Range 03/24/2018 06:45 03/24/2018 12:02 03/24/2018 15:58 03/24/2018 22:26  Glucose-Capillary Latest Ref Range: 70 - 99 mg/dL 229 (H) 180 (H) 124 (H) 236 (H)   Results for GISSELLA, NIBLACK (MRN [id number]) as of 03/25/2018 11:00  Ref. Range 03/23/2018 02:40  Hemoglobin A1C Latest Ref Range: 4.8 - 5.6 % 9.9 (H)   Review of Glycemic Control  Diabetes history: DM2 Outpatient Diabetes medications: Metformin 1000 mg BID, Glipizide 5 mg QAM (per home med list was not taking Glipizide) Current orders for Inpatient glycemic control: Glipizide 5 mg AM, Novolog 0-9 units TID with meals  Inpatient Diabetes Program Recommendations:   Correction (SSI): Please consider ordering Novolog 0-5 units QHS for bedtime correction scale.  Insulin - Meal Coverage:  Please consider ordering Novolog 3 units TID with meals for meal coverage if patient eats at least 50% of meals.  HgbA1C: A1C 9.9% on 03/23/18. Diabetes Coordinator spoke with patient on 03/23/18 (see note for details). Patient needs to take DM medications as prescribed and follow up with PCP regarding DM control.  Thanks, Barnie Alderman, RN, MSN, CDE Diabetes Coordinator Inpatient Diabetes Program [phone] (Team Pager from 8am to 5pm)

## 2018-03-26 NOTE — Anesthesia Preprocedure Evaluation (Deleted)
Anesthesia Evaluation    Reviewed: Allergy & Precautions, Patient's Chart, lab work & pertinent test results  Airway        Dental   Pulmonary Current Smoker,           Cardiovascular   ECG: NSR, rate 77  ECHO: 1. The left ventricle has hyperdynamic systolic function, with an ejection fraction of >65%. The cavity size was normal. Left ventricular diastolic parameters were normal. 2. The right ventricle has normal systolic function. The cavity was normal. There is no increase in right ventricular wall thickness. 3. The mitral valve is normal in structure. 4. The tricuspid valve is normal in structure. 5. The aortic valve is tricuspid Mild thickening of the aortic valve. 6. The aortic root, ascending aorta and aortic arch are normal in size and structure.     Neuro/Psych negative neurological ROS     GI/Hepatic negative GI ROS, Neg liver ROS,   Endo/Other  diabetes, Oral Hypoglycemic Agents  Renal/GU negative Renal ROS     Musculoskeletal negative musculoskeletal ROS (+)   Abdominal (+) + obese,   Peds  Hematology  (+) anemia ,   Anesthesia Other Findings LEFT GROIN WOUND  Reproductive/Obstetrics                            Anesthesia Physical Anesthesia Plan  ASA: III  Anesthesia Plan: General   Post-op Pain Management:    Induction:   PONV Risk Score and Plan: 2 and Ondansetron, Midazolam and Treatment may vary due to age or medical condition  Airway Management Planned: LMA and Oral ETT  Additional Equipment:   Intra-op Plan:   Post-operative Plan: Extubation in OR  Informed Consent:   Plan Discussed with:   Anesthesia Plan Comments:         Anesthesia Quick Evaluation
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
## 2018-03-26 NOTE — Plan of Care (Signed)
  Problem: Respiratory: Goal: Respiratory status will improve Outcome: Progressing   Problem: Urinary Elimination: Goal: Ability to achieve and maintain adequate renal perfusion and functioning will improve Outcome: Progressing   Problem: Clinical Measurements: Goal: Postoperative complications will be avoided or minimized Outcome: Not Progressing

## 2018-03-26 NOTE — Plan of Care (Signed)
  Problem: Education: Goal: Knowledge of the prescribed therapeutic regimen will improve Outcome: Progressing   Problem: Bowel/Gastric: Goal: Gastrointestinal status for postoperative course will improve Outcome: Progressing   Problem: Cardiac: Goal: Ability to maintain an adequate cardiac output will improve Outcome: Progressing   Problem: Clinical Measurements: Goal: Postoperative complications will be avoided or minimized Outcome: Progressing   Problem: Clinical Measurements: Goal: Postoperative complications will be avoided or minimized Outcome: Progressing   Problem: Respiratory: Goal: Respiratory status will improve Outcome: Progressing   Problem: Education: Goal: Knowledge of prescribed regimen will improve Outcome: Progressing   Problem: Activity: Goal: Ability to tolerate increased activity will improve Outcome: Progressing

## 2018-03-26 NOTE — Progress Notes (Signed)
Patient's JP drain was emptied and noticed that there was some type of tissue inside the drain. This RN was unable to remove it from the drain due to the size and thickness of the tissue. JP drain was emptied and recharged. Output was recorded

## 2018-03-26 NOTE — Progress Notes (Signed)
ANTICOAGULATION CONSULT NOTE - Follow Up Consult  Pharmacy Consult for Heparin Indication: acute thrombus in left common iliac artery  No Known Allergies  Patient Measurements: Height: 5' 2"  (157.5 cm) Weight: 204 lb 12.9 oz (92.9 kg) IBW/kg (Calculated) : 50.1 Heparin Dosing Weight: 72 kg  Vital Signs: Temp: 98.2 F (36.8 C) (02/27 1319) Temp Source: Oral (02/27 1319) BP: 122/89 (02/27 1319) Pulse Rate: 82 (02/27 1319)  Labs: Recent Labs    03/24/18 0516 03/25/18 0313  03/25/18 1851 03/26/18 0304 03/26/18 0744 03/26/18 1602  HGB 10.0* 9.6*  --   --  10.7*  --   --   HCT 29.8* 29.4*  --   --  32.6*  --   --   PLT 311 318  --   --  388  --   --   APTT  --   --    < > 33 53* 56* 51*  HEPARINUNFRC 0.29*  --   --  1.62* 0.76*  --   --   CREATININE  --  0.63  --   --  0.56  --   --    < > = values in this interval not displayed.    Estimated Creatinine Clearance: 95.2 mL/min (by C-G formula based on SCr of 0.56 mg/dL).  Assessment:  Anticoag: No AC PTA. CTA +acute thrombus in left common iliac artery with focal complete or near complete occlusion at the level of bifurcation. S/p thrombectomy 2/23 Heparin on admit >> Xarelto 2/25, LD 2/26 at 0730 >> Heparin 2/26 b/c need OR visit on 2/28 APTT 56>51 down.  Goal of Therapy:  aPTT 66-102 seconds Monitor platelets by anticoagulation protocol: Yes   Plan:  Heparin 2000 unit bolus and increase to 2200 units/hr Recheck aPTT in 6-8 hrs Daily HL/aPTT, CBC  Dalen Hennessee S. Alford Highland, PharmD, BCPS Clinical Staff Pharmacist Eilene Ghazi Stillinger 03/26/2018,5:13 PM

## 2018-03-26 NOTE — Progress Notes (Signed)
ANTICOAGULATION CONSULT NOTE  Pharmacy Consult:  Heparin Indication:  Aortic occlusion/embolus  No Known Allergies  Patient Measurements: Height: 5' 2"  (157.5 cm) Weight: 204 lb 12.9 oz (92.9 kg) IBW/kg (Calculated) : 50.1 Heparin Dosing Weight: 72 kg  Vital Signs: Temp: 97.7 F (36.5 C) (02/26 2222) Temp Source: Oral (02/26 2222) BP: 116/65 (02/26 2222) Pulse Rate: 82 (02/26 2222)  Labs: Recent Labs    03/24/18 0516 03/25/18 0313 03/25/18 1851 03/26/18 0304 03/26/18 0744  HGB 10.0* 9.6*  --  10.7*  --   HCT 29.8* 29.4*  --  32.6*  --   PLT 311 318  --  388  --   APTT  --   --  33 53* 56*  HEPARINUNFRC 0.29*  --  1.62* 0.76*  --   CREATININE  --  0.63  --  0.56  --    Estimated Creatinine Clearance: 95.2 mL/min (by C-G formula based on SCr of 0.56 mg/dL).  Assessment: 33 yoF presenting with LLE pain and cold sensation. CTA aortobifemoral significant for acute thrombus in left common iliac artery with focal complete or near complete occlusion at the level of bifurcation.  She is s/p thrombectomy on 03/22/18.  Patient was started on IV heparin on admit, then transitioned to Xarelto on 03/24/18 (last dose 2/26 at 0739).  She has persistent lymphatic drainage requiring OR visit, so Pharmacy consulted to dose IV heparin while Xarelto is on hold.  Using aPTT to guide heparin dosing as Xarelto may falsely elevate heparin levels.    APTT is sub-therapeutic; no bleeding reported.  Goal of Therapy:  Heparin level 0.3-0.7 units/mL APTT 66-102 sec Monitor platelets by anticoagulation protocol: Yes   Plan:  Increase heparin gtt to 1900 units/hr Check 6 hr aPTT Daily heparin level, aPTT and CBC  Nashayla Telleria D. Mina Marble, PharmD, BCPS, [place] 03/26/2018, 8:39 AM

## 2018-03-26 NOTE — Telephone Encounter (Signed)
sch appt VM NA mld ltr 04/08/2018 830am p/o MD

## 2018-03-26 NOTE — Progress Notes (Signed)
Patient does not want to continue with her confidential status, she said she "is not worried about anyone knowing where she is." I asked the question a second time, to make sure the patient understood. Again she said she does not want to be a confidential patient.

## 2018-03-26 NOTE — Telephone Encounter (Signed)
-----   Message from Pennelope Bracken sent at 03/25/2018  9:27 AM EST -----   ----- Message ----- From: Gabriel Earing, PA-C Sent: 03/24/2018   2:02 PM EST To: [facility] Admin [facility], [facility] Charge [facility]  S/p embolectomy LLE .  F/u with Dr. Scot Dock in 2-3 weeks.  Thanks

## 2018-03-26 NOTE — Progress Notes (Signed)
Occupational Therapy Treatment Patient Details Name: Megan Riddle MRN: [id number] DOB: 03-23-1975 Today's Date: 03/26/2018    History of present illness ISCHEMIC LEFT LOWER EXTREMITY: This patient presents with an ischemic left lower extremity and thrombus noted on CT scan in the left common iliac artery and external iliac artery.  Pt s/p left iliofemoral thrombectomy, Vein patch angioplasty of the left common femoral artery with saphenous vein graft   OT comments  Pt completed sponge bathing and dressing seated at sink with assist only to manage lines. Did not require AE. Will follow to address tub transfers.  Follow Up Recommendations  No OT follow up;Supervision/Assistance - 24 hour    Equipment Recommendations  3 in 1 bedside commode;Tub/shower seat    Recommendations for Other Services      Precautions / Restrictions Precautions Precautions: Fall Precaution Comments: wound vac, JB drain Restrictions Weight Bearing Restrictions: No       Mobility Bed Mobility Overal bed mobility: Needs Assistance Bed Mobility: Sit to Supine       Sit to supine: Supervision   General bed mobility comments: Up in chair  Transfers Overall transfer level: Modified independent Equipment used: Rolling walker (2 wheeled) Transfers: Sit to/from Stand Sit to Stand: Modified independent (Device/Increase time)         General transfer comment: Pt with incr time due to pain in lt foot    Balance Overall balance assessment: Needs assistance Sitting-balance support: Feet supported;No upper extremity supported Sitting balance-Leahy Scale: Good     Standing balance support: No upper extremity supported;During functional activity Standing balance-Leahy Scale: Fair                             ADL either performed or assessed with clinical judgement   ADL Overall ADL's : Modified independent                                     Functional mobility  during ADLs: Supervision/safety;Rolling walker General ADL Comments: pt completed sponge bathing and dressing with assist to manage lines only. Pt able to cross foot over opposite knee to don socks.     Vision       Perception     Praxis      Cognition Arousal/Alertness: Awake/alert Behavior During Therapy: WFL for tasks assessed/performed Overall Cognitive Status: Within Functional Limits for tasks assessed                                          Exercises   Shoulder Instructions       General Comments      Pertinent Vitals/ Pain       Pain Assessment: Faces Faces Pain Scale: Hurts whole lot Pain Location: Lt foot and groin Pain Descriptors / Indicators: Tightness;Throbbing Pain Intervention(s): Monitored during session;Repositioned;Patient requesting pain meds-RN notified  Home Living                                          Prior Functioning/Environment              Frequency  Min 2X/week        Progress Toward Goals  OT Goals(current  goals can now be found in the care plan section)  Progress towards OT goals: Progressing toward goals  Acute Rehab OT Goals Patient Stated Goal: did not state OT Goal Formulation: With patient Time For Goal Achievement: 04/06/18 Potential to Achieve Goals: Good  Plan Discharge plan remains appropriate    Co-evaluation                 AM-PAC OT "6 Clicks" Daily Activity     Outcome Measure   Help from another person eating meals?: None Help from another person taking care of personal grooming?: None Help from another person toileting, which includes using toliet, bedpan, or urinal?: None Help from another person bathing (including washing, rinsing, drying)?: None Help from another person to put on and taking off regular upper body clothing?: None Help from another person to put on and taking off regular lower body clothing?: None 6 Click Score: 24    End of Session  Equipment Utilized During Treatment: Rolling walker  OT Visit Diagnosis: Unsteadiness on feet (R26.81);Other abnormalities of gait and mobility (R26.89);Pain;Muscle weakness (generalized) (M62.81) Pain - Right/Left: Left Pain - part of body: Leg   Activity Tolerance Patient tolerated treatment well   Patient Left in bed;with call bell/phone within reach   Nurse Communication          Time: 7989-2119 OT Time Calculation (min): 40 min  Charges: OT General Charges $OT Visit: 1 Visit OT Treatments $Self Care/Home Management : 38-52 mins  Nestor Lewandowsky, OTR/L Acute Rehabilitation Services Pager: [phone] Office: [phone]   Malka So 03/26/2018, 1:46 PM

## 2018-03-26 NOTE — Progress Notes (Signed)
ANTICOAGULATION CONSULT NOTE - Follow Up Consult  Pharmacy Consult for heparin Indication: embolic event  Labs: Recent Labs    03/24/18 0516 03/25/18 0313 03/25/18 1851 03/26/18 0304  HGB 10.0* 9.6*  --  10.7*  HCT 29.8* 29.4*  --  32.6*  PLT 311 318  --  388  APTT  --   --  33 53*  HEPARINUNFRC 0.29*  --  1.62* 0.76*  CREATININE  --  0.63  --  0.56    Assessment/Plan:  43yo female subtherapeutic on heparin though lab was drawn early and likely needs more time to accumulate. Will continue gtt at current rate and check additional level.   Wynona Neat, PharmD, BCPS  03/26/2018,3:58 AM

## 2018-03-26 NOTE — Progress Notes (Signed)
Patient refused to sign consent, she is requesting to wait until tomorrow morning.

## 2018-03-26 NOTE — Progress Notes (Addendum)
[facility] VASCULAR SURGERY ASSESSMENT & PLAN:   EMBOLIC EVENT LEFT LOWER EXTREMITY: She has a biphasic peroneal signal with the Doppler and a monophasic posterior tibial and dorsalis pedis signal.  She has had some pain in the left foot but states that the pain is actually better when she elevates her leg.  Thus the history is somewhat confusing.  Given that she has a biphasic peroneal signal and a reasonable signal at the ankle and the posterior tibial artery and distal anterior tibial artery I think this will gradually improve.  We have had a long discussion about the importance of tobacco cessation.  I have explained that the only other option would be to obtain an arteriogram to further assess her tibial disease.  However given that her symptoms appear to be gradually improving we will hold off on this for now.   PERSISTENT LYMPHATIC DRAINAGE FROM JP: Her JP drain 100 cc for 12 hours and then 100 cc again for the next 12 hours.  Thus this has decreased some.  She is n.p.o. tonight.  If her drainage continues to decrease we may be able to hold off on exploration of her left groin tomorrow.  However if she has persistent lymphatic drainage I would recommend exploration of her left groin for a persistent lymphatic leak.    ANTICOAGULATION: Her Xarelto is on hold pending the decision on whether or not she have surgery tomorrow.  In the meantime she is on intravenous heparin.  Her echo was unremarkable.  There was no obvious embolic source.  The pathology was thrombus.  It was not a myxoma.  Megan Mayo, MD, FACS Beeper [phone] Office: [phone]    Subjective  - Tearful at times with the left foot pain.   Objective 116/65 82 97.7 F (36.5 C) (Oral) (!) 22 93%  Intake/Output Summary (Last 24 hours) at 03/26/2018 0711 Last data filed at 03/26/2018 0403 Gross per 24 hour  Intake 289.11 ml  Output 200 ml  Net 89.11 ml    Doppler signals  AT/Peroneal > PT, foot is cool to touch with mild edema.   Left groin with incisional vac and JP with clear fluid collection with total 200 cc output last 24.   Heart RRR Lungs non labored breathing   Assessment/Planning: POD # 4 Left iliofemoral thrombectomy  Due to the amount of Lyph fluid drainage in JP daily Dr. Carlis Abbott is planning re exploration of the left groin to control the fluid leak to help promote healing.   Xarelto has been held and she is on IV Heparin.   She is well perfused to the ankle with doppler signals, she does has ischemic pain in the left foot without open wounds.  I have encouraged mobility to help perfusion via collaterals.  She is using both PO and IV push pain medication daily.   NPO orders placed for OR tomorrow.    Megan Riddle 03/26/2018 7:11 AM --  Laboratory Lab Results: Recent Labs    03/25/18 0313 03/26/18 0304  WBC 11.4* 13.0*  HGB 9.6* 10.7*  HCT 29.4* 32.6*  PLT 318 388   BMET Recent Labs    03/25/18 0313 03/26/18 0304  NA 136 136  K 3.7 3.9  CL 102 100  CO2 27 26  GLUCOSE 243* 194*  BUN 5* 6  CREATININE 0.63 0.56  CALCIUM 9.1 9.4    COAG No results found for: INR, PROTIME No results found for: PTT

## 2018-03-27 ENCOUNTER — Encounter (HOSPITAL_COMMUNITY)
Admission: EM | Disposition: A | Discharge status: Home / Self Care | Payer: Self-pay | Source: Home / Self Care | Attending: Vascular Surgery

## 2018-03-27 LAB — BASIC METABOLIC PANEL
Anion gap: 9 (ref 5–15)
BUN: 5 mg/dL — ABNORMAL LOW (ref 6–20)
CO2: 29 mmol/L (ref 22–32)
Calcium: 9.3 mg/dL (ref 8.9–10.3)
Chloride: 99 mmol/L (ref 98–111)
Creatinine, Ser: 0.5 mg/dL (ref 0.44–1.00)
GFR calc Af Amer: >60 mL/min (ref >60)
GFR calc non Af Amer: >60 mL/min (ref >60)
Glucose, Bld: 181 mg/dL — ABNORMAL HIGH (ref 70–99)
Potassium: 3.7 mmol/L (ref 3.5–5.1)
Sodium: 137 mmol/L (ref 135–145)

## 2018-03-27 LAB — HEPARIN LEVEL (UNFRACTIONATED): HEPARIN UNFRACTIONATED: 0.78 [IU]/mL — AB (ref 0.30–0.70)

## 2018-03-27 LAB — CBC
HCT: 32.1 % — ABNORMAL LOW (ref 36.0–46.0)
Hemoglobin: 10.7 g/dL — ABNORMAL LOW (ref 12.0–15.0)
MCH: 32.8 pg (ref 26.0–34.0)
MCHC: 33.3 g/dL (ref 30.0–36.0)
MCV: 98.5 fL (ref 80.0–100.0)
Platelets: 399 10*3/uL (ref 150–400)
RBC: 3.26 MIL/uL — ABNORMAL LOW (ref 3.87–5.11)
RDW: 11.7 % (ref 11.5–15.5)
WBC: 12 10*3/uL — ABNORMAL HIGH (ref 4.0–10.5)
nRBC: 0 % (ref 0.0–0.2)

## 2018-03-27 LAB — GLUCOSE, CAPILLARY
Glucose-Capillary: 182 mg/dL — ABNORMAL HIGH (ref 70–99)
Glucose-Capillary: 203 mg/dL — ABNORMAL HIGH (ref 70–99)
Glucose-Capillary: 106 mg/dL — ABNORMAL HIGH (ref 70–99)
Glucose-Capillary: 204 mg/dL — ABNORMAL HIGH (ref 70–99)

## 2018-03-27 LAB — APTT
aPTT: 86 seconds — ABNORMAL HIGH (ref 24–36)
aPTT: 82 seconds — ABNORMAL HIGH (ref 24–36)

## 2018-03-27 LAB — SURGICAL PCR SCREEN
MRSA, PCR: NEGATIVE
Staphylococcus aureus: NEGATIVE

## 2018-03-27 SURGERY — DEBRIDEMENT, INGUINAL REGION
Anesthesia: General | Laterality: Left

## 2018-03-27 MED ORDER — RIVAROXABAN 20 MG PO TABS
20.0000 mg | ORAL_TABLET | Freq: Every day | ORAL | Status: DC
  Administered 2018-03-29: 20 mg via ORAL

## 2018-03-27 MED ORDER — RIVAROXABAN 15 MG PO TABS
15.0000 mg | ORAL_TABLET | Freq: Two times a day (BID) | ORAL | Status: DC
  Administered 2018-03-27 – 2018-03-30 (×7): 15 mg via ORAL
  Filled 2018-03-27 (×8): qty 1

## 2018-03-27 MED ORDER — METFORMIN HCL 500 MG PO TABS
1000.0000 mg | ORAL_TABLET | Freq: Two times a day (BID) | ORAL | Status: DC
  Administered 2018-03-27 (×2): 1000 mg via ORAL
  Administered 2018-03-28: 500 mg via ORAL
  Administered 2018-03-28 – 2018-03-29 (×3): 1000 mg via ORAL
  Filled 2018-03-27 (×8): qty 2

## 2018-03-27 NOTE — Progress Notes (Signed)
ANTICOAGULATION CONSULT NOTE - Follow Up Consult  Pharmacy Consult for Heparin Indication: acute thrombus in left common iliac artery  No Known Allergies  Patient Measurements: Height: 5' 2"  (157.5 cm) Weight: 204 lb 12.9 oz (92.9 kg) IBW/kg (Calculated) : 50.1 Heparin Dosing Weight: 72 kg  Vital Signs: Temp: 98.1 F (36.7 C) (02/28 0053) Temp Source: Oral (02/28 0053) BP: 117/82 (02/28 0053) Pulse Rate: 69 (02/28 0053)  Labs: Recent Labs    03/24/18 0516 03/25/18 0313  03/25/18 1851 03/26/18 0304 03/26/18 0744 03/26/18 1602 03/27/18 0049  HGB 10.0* 9.6*  --   --  10.7*  --   --   --   HCT 29.8* 29.4*  --   --  32.6*  --   --   --   PLT 311 318  --   --  388  --   --   --   APTT  --   --    < > 33 53* 56* 51* 86*  HEPARINUNFRC 0.29*  --   --  1.62* 0.76*  --   --   --   CREATININE  --  0.63  --   --  0.56  --   --   --    < > = values in this interval not displayed.    Estimated Creatinine Clearance: 95.2 mL/min (by C-G formula based on SCr of 0.56 mg/dL).  Assessment:  Anticoag: No AC PTA. CTA +acute thrombus in left common iliac artery with focal complete or near complete occlusion at the level of bifurcation. S/p thrombectomy 2/23 Heparin on admit >> Xarelto 2/25, LD 2/26 at 0730 >> Heparin 2/26 b/c need OR visit on 2/28 APTT 56>51 down.  2/28 AM update: aPTT therapeutic x 1 after rate increase  Goal of Therapy:  Heparin level 0.3-0.7 units/mL aPTT 66-102 seconds Monitor platelets by anticoagulation protocol: Yes   Plan:  Cont heparin at 2200 units/hr Confirmatory aPTT with AM labs  Narda Bonds, PharmD, [place] Pharmacist Phone: [phone]

## 2018-03-27 NOTE — Progress Notes (Signed)
Call Dr. Scot Dock and he was made aware of J.P. output of 36 ml and no out put via Wound Vac. He will call and cancel O.R. for now . See order for Carb med. Mod. Diet
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
## 2018-03-27 NOTE — Discharge Instructions (Signed)
[facility]  Discharge instructions  Lower Extremity Bypass Surgery  Please refer to the following instruction for your post-procedure care. Your surgeon or physician assistant will discuss any changes with you.  Activity  You are encouraged to walk as much as you can. You can slowly return to normal activities during the month after your surgery. Avoid strenuous activity and heavy lifting until your doctor tells you it's OK. Avoid activities such as vacuuming or swinging a golf club. Do not drive until your doctor give the OK and you are no longer taking prescription pain medications. It is also normal to have difficulty with sleep habits, eating and bowel movement after surgery. These will go away with time.  Bathing/Showering  Shower daily after you go home. Do not soak in a bathtub, hot tub, or swim until the incision heals completely.  Incision Care  Clean your incision with mild soap and water. Shower every day. Pat the area dry with a clean towel. You do not need a bandage unless otherwise instructed. Do not apply any ointments or creams to your incision. If you have open wounds you will be instructed how to care for them or a visiting nurse may be arranged for you. If you have staples or sutures along your incision they will be removed at your post-op appointment. You may have skin glue on your incision. Do not peel it off. It will come off on its own in about one week.  Wash the groin wound with soap and water daily and pat dry. (No tub bath-only shower)  Then put a dry gauze or washcloth in the groin to keep this area dry to help prevent wound infection.  Do this daily and as needed.  Do not use Vaseline or neosporin on your incisions.  Only use soap and water on your incisions and then protect and keep dry.  Diet  Resume your normal diet. There are no special food restrictions following this procedure. A low fat/ low cholesterol diet is  recommended for all patients with vascular disease. In order to heal from your surgery, it is CRITICAL to get adequate nutrition. Your body requires vitamins, minerals, and protein. Vegetables are the best source of vitamins and minerals. Vegetables also provide the perfect balance of protein. Processed food has little nutritional value, so try to avoid this.  Medications  Resume taking all your medications unless your doctor or physician assistant tells you not to. If your incision is causing pain, you may take over-the-counter pain relievers such as acetaminophen (Tylenol). If you were prescribed a stronger pain medication, please aware these medication can cause nausea and constipation. Prevent nausea by taking the medication with a snack or meal. Avoid constipation by drinking plenty of fluids and eating foods with high amount of fiber, such as fruits, vegetables, and grains. Take Colace 100 mg (an over-the-counter stool softener) twice a day as needed for constipation.  Do not take Tylenol if you are taking prescription pain medications.  Start Xarelto starter pack and when you finish this, you will start the Xarelto 43m daily.  Follow Up  Our office will schedule a follow up appointment 2-3 weeks following discharge.  Please call [facility] for any of the following conditions  Severe or worsening pain in your legs or feet while at rest or while walking Increase pain, redness, warmth, or drainage (pus) from your incision site(s) Fever of 101 degree or higher The swelling in your leg with the  bypass suddenly worsens and becomes more painful than when you were in the hospital If you have been instructed to feel your graft pulse then you should do so every day. If you can no longer feel this pulse, call the office immediately. Not all patients are given this instruction.  Leg swelling is common after leg bypass surgery.  The swelling should improve over a few months following surgery.  To improve the swelling, you may elevate your legs above the level of your heart while you are sitting or resting. Your surgeon or physician assistant may ask you to apply an ACE wrap or wear compression (TED) stockings to help to reduce swelling.  Reduce your risk of vascular disease  Stop smoking. If you would like help call [facility] at 1-800-QUIT-NOW (914) 280-7744) or [place] at [phone].  Manage your cholesterol Maintain a desired weight Control your diabetes weight Control your diabetes Keep your blood pressure down  If you have any questions, please call the office at [phone]    Information on my medicine - XARELTO (rivaroxaban)  This medication education was reviewed with me or my healthcare representative as part of my discharge preparation.    WHY WAS XARELTO PRESCRIBED FOR YOU? Xarelto was prescribed to treat blood clots that may have been found in the veins of your legs (deep vein thrombosis) or in your lungs (pulmonary embolism) and to reduce the risk of them occurring again.  What do you need to know about Xarelto? The starting dose is one 15 mg tablet taken TWICE daily with food for the FIRST 21 DAYS then on (enter date)  04/17/18  the dose is changed to one 20 mg tablet taken ONCE A DAY with your evening meal.  DO NOT stop taking Xarelto without talking to the health care provider who prescribed the medication.  Refill your prescription for 20 mg tablets before you run out.  After discharge, you should have regular check-up appointments with your healthcare provider that is prescribing your Xarelto.  In the future your dose may need to be changed if your kidney function changes by a significant amount.  What do you do if you miss a dose? If you are taking Xarelto TWICE DAILY and you miss a dose, take it as soon as you remember. You may take two 15 mg tablets (total 30 mg) at the same time then resume your regularly scheduled 15 mg twice daily the  next day.  If you are taking Xarelto ONCE DAILY and you miss a dose, take it as soon as you remember on the same day then continue your regularly scheduled once daily regimen the next day. Do not take two doses of Xarelto at the same time.   Important Safety Information Xarelto is a blood thinner medicine that can cause bleeding. You should call your healthcare provider right away if you experience any of the following: ? Bleeding from an injury or your nose that does not stop. ? Unusual colored urine (red or dark brown) or unusual colored stools (red or black). ? Unusual bruising for unknown reasons. ? A serious fall or if you hit your head (even if there is no bleeding).  Some medicines may interact with Xarelto and might increase your risk of bleeding while on Xarelto. To help avoid this, consult your healthcare provider or pharmacist prior to using any new prescription or non-prescription medications, including herbals, vitamins, non-steroidal anti-inflammatory drugs (NSAIDs) and supplements.  This website has more information on Xarelto: [email or web address].[email or web address]

## 2018-03-27 NOTE — Progress Notes (Addendum)
ANTICOAGULATION CONSULT NOTE  Pharmacy Consult:  Heparin Indication:  Aortic occlusion/embolus  No Known Allergies  Patient Measurements: Height: 5' 2"  (157.5 cm) Weight: 207 lb 10.8 oz (94.2 kg) IBW/kg (Calculated) : 50.1 Heparin Dosing Weight: 72 kg  Vital Signs: Temp: 98 F (36.7 C) (02/28 0820) Temp Source: Oral (02/28 0820) BP: 110/76 (02/28 0820) Pulse Rate: 94 (02/28 0820)  Labs: Recent Labs    03/25/18 0313  03/25/18 1851 03/26/18 0304  03/26/18 1602 03/27/18 0049 03/27/18 0536  HGB 9.6*  --   --  10.7*  --   --   --  10.7*  HCT 29.4*  --   --  32.6*  --   --   --  32.1*  PLT 318  --   --  388  --   --   --  399  APTT  --    < > 33 53*   < > 51* 86* 82*  HEPARINUNFRC  --   --  1.62* 0.76*  --   --   --  0.78*  CREATININE 0.63  --   --  0.56  --   --   --  0.50   < > = values in this interval not displayed.   Estimated Creatinine Clearance: 95.9 mL/min (by C-G formula based on SCr of 0.5 mg/dL).  Assessment: 78 yoF presenting with LLE pain and cold sensation. CTA aortobifemoral significant for acute thrombus in left common iliac artery with focal complete or near complete occlusion at the level of bifurcation.  She is s/p thrombectomy on 03/22/18.  Patient was started on IV heparin on admit, then transitioned to Xarelto on 03/24/18 (last dose 2/26 at 0739).  She has persistent lymphatic drainage with possible OR visit, so Pharmacy consulted to dose IV heparin while Xarelto is on hold.  Using aPTT to guide heparin dosing as Xarelto may falsely elevate heparin levels.    APTT is therapeutic; no bleeding reported.  Goal of Therapy:  Heparin level 0.3-0.7 units/mL APTT 66-102 sec Monitor platelets by anticoagulation protocol: Yes   Plan:  Continue heparin gtt at 2200 units/hr Daily heparin level, aPTT and CBC until aPTT/HL correlate  F/U Vascular plans  Shawanda Sievert D. Mina Marble, PharmD, BCPS, [place] 03/27/2018, 8:36 AM    ===============================  Addendum: Surgery canceled and Pharmacy consulted to restart Xarelto Patient previously received ~3 doses of the treatment regimen, so will restart the entire regimen  Xarelto 32m PO BIDWM x 21 days, then on 04/17/18 start 264mdaily with supper Pharmacy will sign off and follow peripherally.  Thank you for the consult!  Etai Copado D. DaMina MarblePharmD, BCPS, BCDelta/28/2020, 11:13 AM

## 2018-03-27 NOTE — Progress Notes (Addendum)
[facility]  VASCULAR SURGERY ASSESSMENT & PLAN:   EMBOLIC EVENT LEFT LOWER EXTREMITY:  POD 5 status post left iliofemoral embolectomy. She has a biphasic peroneal signal with the Doppler and a monophasic posterior tibial and dorsalis pedis signal.    Her foot seems warmer today.  Her pain is improving and she is able to ambulate now some.  Given that her JP drainage has dropped off significantly we will restart her Xarelto and stop her intravenous heparin.  She can be discharged when she is able to ambulate.  LYMPHATIC DRAINAGE FROM JP: Her JP drainage has dropped off significantly so we canceled her surgery today.  If it is less than 30 cc tomorrow this can be discontinued.  Her Praveena can be removed on Sunday if she is still here.  If she goes home tomorrow then it could be removed before discharge.   , MD, FACS Beeper [phone] Office: [phone]  Subjective  - Still complaining of left mid shin to foot pain.  Objective (!) 128/92 81 98.1 F (36.7 C) (Oral) 17 100%  Intake/Output Summary (Last 24 hours) at 03/27/2018 0711 Last data filed at 03/27/2018 0657 Gross per 24 hour  Intake 1224 ml  Output 1946 ml  Net -722 ml    Doppler signals PT/Peroneal/AT, skin warm to touch, painful to touch.  No open wounds. Left groin incisional vac without drainage, JP 36 cc 11 hours Heart RRR Lungs non labored breathing  Assessment/Planning: POD # 5 Left iliofemoral thrombectomy Will maintain JP today.  Will plan to restart Xarelto tomorrow and maintain Heparin for now pending JP output in next 24 hours.  If JP out put < 50 cc will d/c. She is maintaining perfusion to the ankle via doppler signals, and she is ambulating with therapy. Possible to discontinue JP tomorrow and discharge home on Xarelto.   F/U in in 2 weeks with Dr.    Emma Maureen Collins 03/27/2018 7:11 AM --  Laboratory Lab Results: Recent Labs     02 /27/20 0304 03/27/18 0536  WBC 13.0* 12.0*  HGB 10.7* 10.7*  HCT 32.6* 32.1*  PLT 388 399   BMET Recent Labs    03/26/18 0304 03/27/18 0536  NA 136 137  K 3.9 3.7  CL 100 99  CO2 26 29  GLUCOSE 194* 181*  BUN 6 5*  CREATININE 0.56 0.50  CALCIUM 9.4 9.3    COAG No results found for: INR, PROTIME No results found for: PTT

## 2018-03-27 NOTE — Progress Notes (Signed)
Physical Therapy Treatment Patient Details Name: Megan Riddle MRN: [id number] DOB: 14-Aug-1975 Today's Date: 03/27/2018    History of Present Illness ISCHEMIC LEFT LOWER EXTREMITY: This patient presents with an ischemic left lower extremity and thrombus noted on CT scan in the left common iliac artery and external iliac artery.  Pt s/p left iliofemoral thrombectomy, Vein patch angioplasty of the left common femoral artery with saphenous vein graft    PT Comments    Patient received up with nursing staff. Patient very motivated to progress mobility. Ambulating in hallway with RW - reduced weight shift to L LE with heavy use of UE at RW to offload, as well as reduced heel strike needed for normal gait mechanics. Making good progress towards goals. Will continue to follow.     Follow Up Recommendations  Home health PT;Supervision - Intermittent     Equipment Recommendations  Rolling walker with 5" wheels;3in1 (PT)    Recommendations for Other Services       Precautions / Restrictions Precautions Precautions: Fall Precaution Comments: wound vac, JP drain Restrictions Weight Bearing Restrictions: No    Mobility  Bed Mobility               General bed mobility comments: up with nursing upon arrival  Transfers Overall transfer level: Modified independent Equipment used: Rolling walker (2 wheeled) Transfers: Sit to/from Stand Sit to Stand: Modified independent (Device/Increase time)         General transfer comment: able to scoot in chair and reposition without assist  Ambulation/Gait Ambulation/Gait assistance: Supervision;Min guard Gait Distance (Feet): 350 Feet Assistive device: Rolling walker (2 wheeled) Gait Pattern/deviations: Step-to pattern;Step-through pattern;Decreased stance time - left;Decreased weight shift to left;Antalgic Gait velocity: decreased   General Gait Details: increased use of UE at RW to offload L LE - still tends to demonstrate  reduced heel strike due to pain and "tightness"   Stairs             Wheelchair Mobility    Modified Rankin (Stroke Patients Only)       Balance Overall balance assessment: Needs assistance Sitting-balance support: Feet supported;No upper extremity supported Sitting balance-Leahy Scale: Good     Standing balance support: Bilateral upper extremity supported;During functional activity Standing balance-Leahy Scale: Fair Standing balance comment: Poor tolerance of weight bearing through LLE and required UE support                            Cognition Arousal/Alertness: Awake/alert Behavior During Therapy: WFL for tasks assessed/performed Overall Cognitive Status: Within Functional Limits for tasks assessed                                        Exercises      General Comments General comments (skin integrity, edema, etc.): very pleasant and motivated      Pertinent Vitals/Pain Pain Assessment: 0-10 Pain Score: 7  Pain Location: Lt foot and groin Pain Descriptors / Indicators: Aching;Discomfort;Grimacing;Guarding Pain Intervention(s): Limited activity within patient's tolerance;Monitored during session;Repositioned    Home Living                      Prior Function            PT Goals (current goals can now be found in the care plan section) Acute Rehab PT Goals Patient Stated Goal: did not  state PT Goal Formulation: With patient Time For Goal Achievement: 04/02/18 Potential to Achieve Goals: Good Progress towards PT goals: Progressing toward goals    Frequency    Min 3X/week      PT Plan Current plan remains appropriate    Co-evaluation              AM-PAC PT "6 Clicks" Mobility   Outcome Measure  Help needed turning from your back to your side while in a flat bed without using bedrails?: None Help needed moving from lying on your back to sitting on the side of a flat bed without using bedrails?:  None Help needed moving to and from a bed to a chair (including a wheelchair)?: A Little Help needed standing up from a chair using your arms (e.g., wheelchair or bedside chair)?: A Little Help needed to walk in hospital room?: A Little Help needed climbing 3-5 steps with a railing? : A Little 6 Click Score: 20    End of Session Equipment Utilized During Treatment: Gait belt Activity Tolerance: Patient tolerated treatment well Patient left: in chair;with call bell/phone within reach Nurse Communication: Mobility status PT Visit Diagnosis: Difficulty in walking, not elsewhere classified (R26.2);Pain Pain - Right/Left: Left Pain - part of body: Ankle and joints of foot;Leg     Time: 6184-8592 PT Time Calculation (min) (ACUTE ONLY): 15 min  Charges:  $Gait Training: 8-22 mins                     Lanney Gins, PT, DPT Supplemental Physical Therapist 03/27/18 11:06 AM Pager: [phone] Office: [phone]

## 2018-03-28 LAB — GLUCOSE, CAPILLARY
GLUCOSE-CAPILLARY: 240 mg/dL — AB (ref 70–99)
Glucose-Capillary: 224 mg/dL — ABNORMAL HIGH (ref 70–99)
Glucose-Capillary: 155 mg/dL — ABNORMAL HIGH (ref 70–99)
Glucose-Capillary: 152 mg/dL — ABNORMAL HIGH (ref 70–99)

## 2018-03-28 LAB — CBC
HCT: 32.5 % — ABNORMAL LOW (ref 36.0–46.0)
Hemoglobin: 10.6 g/dL — ABNORMAL LOW (ref 12.0–15.0)
MCH: 32.1 pg (ref 26.0–34.0)
MCHC: 32.6 g/dL (ref 30.0–36.0)
MCV: 98.5 fL (ref 80.0–100.0)
Platelets: 398 10*3/uL (ref 150–400)
RBC: 3.3 MIL/uL — ABNORMAL LOW (ref 3.87–5.11)
RDW: 11.8 % (ref 11.5–15.5)
WBC: 11 10*3/uL — ABNORMAL HIGH (ref 4.0–10.5)
nRBC: 0 % (ref 0.0–0.2)

## 2018-03-28 MED ORDER — HYDROMORPHONE HCL 1 MG/ML IJ SOLN
1.0000 mg | Freq: Once | INTRAMUSCULAR | Status: AC
  Administered 2018-03-28: 1 mg via INTRAVENOUS

## 2018-03-28 MED ORDER — LIDOCAINE 5 % EX PTCH
1.0000 | MEDICATED_PATCH | CUTANEOUS | Status: DC
  Administered 2018-03-28: 1 via TRANSDERMAL
  Filled 2018-03-28 (×2): qty 1

## 2018-03-28 MED ORDER — HYDROMORPHONE HCL 1 MG/ML IJ SOLN
0.5000 mg | INTRAMUSCULAR | Status: DC | PRN
  Administered 2018-03-28 – 2018-03-30 (×11): 1 mg via INTRAVENOUS
  Filled 2018-03-28 (×13): qty 1

## 2018-03-28 NOTE — Progress Notes (Signed)
JP drain removed per order.  Pt tolerated procedure well.  Site covered with Vaseline gauze and, 4 x 4's, with tape.

## 2018-03-28 NOTE — Progress Notes (Signed)
  Progress Note    03/28/2018 11:29 AM 6 Days Post-Op  Subjective: No overnight issues.  Vitals:   03/28/18 0814 03/28/18 0930  BP: 113/63   Pulse:    Resp: (!) 26 18  Temp: 98.2 F (36.8 C)   SpO2: 95%     Physical Exam: Awake alert oriented Groin Megan Riddle VAC in place JP drain scant serosanguineous Monophasic posterior tibial signal at the left ankle Exquisite tenderness palpation dorsum of her foot consistent with bone spur  CBC    Component Value Date/Time   WBC 11.0 (H) 03/28/2018 0337   RBC 3.30 (L) 03/28/2018 0337   HGB 10.6 (L) 03/28/2018 0337   HCT 32.5 (L) 03/28/2018 0337   PLT 398 03/28/2018 0337   MCV 98.5 03/28/2018 0337   MCH 32.1 03/28/2018 0337   MCHC 32.6 03/28/2018 0337   RDW 11.8 03/28/2018 0337   LYMPHSABS 3.3 03/22/2018 1800   MONOABS 0.7 03/22/2018 1800   EOSABS 0.1 03/22/2018 1800   BASOSABS 0.1 03/22/2018 1800    BMET    Component Value Date/Time   NA 137 03/27/2018 0536   K 3.7 03/27/2018 0536   CL 99 03/27/2018 0536   CO2 29 03/27/2018 0536   GLUCOSE 181 (H) 03/27/2018 0536   BUN 5 (L) 03/27/2018 0536   CREATININE 0.50 03/27/2018 0536   CALCIUM 9.3 03/27/2018 0536   GFRNONAA >60 03/27/2018 0536   GFRAA >60 03/27/2018 0536    INR No results found for: INR   Intake/Output Summary (Last 24 hours) at 03/28/2018 1129 Last data filed at 03/28/2018 0900 Gross per 24 hour  Intake 1080 ml  Output 1053 ml  Net 27 ml     Assessment:  42 y.o. female is s/p left iliofemoral embolectomy postoperative day 6.  She has signals at her ankle.  JP drain with scant drainage today.  Plan: JP drain out today She is on Xarelto will need this for home [place] off prior to discharge    [place]. [name], MD [facility] [place] Office: [phone] Pager: [phone]  03/28/2018 11:29 AM

## 2018-03-28 NOTE — Progress Notes (Signed)
Md notified about patient request for more pain medication. New order received.

## 2018-03-28 NOTE — Progress Notes (Signed)
Dressing for Provena came unglued while patient was bathing, reinforced dressing with Tegaderm and suction was able to be achieved.  Will continue to monitor.  Patient while bathing also removed JP drain dressing.  New dressing with Vaseline gauze and 4x4's with tape applied.

## 2018-03-28 NOTE — Progress Notes (Signed)
Pt called to ask for her pain medication, when I bring in the Oxycodone she is able to take at this time, patient refuses it and stated that the Oxycodone can not control her pain. The PRN hydromorphone she is asking for was given at 1800 and can be given again around 2200 per order (every 4 hours). Pt requested to call the doctor. The on call MD will be notified. We'll continue to monitor.

## 2018-03-29 LAB — GLUCOSE, CAPILLARY
GLUCOSE-CAPILLARY: 194 mg/dL — AB (ref 70–99)
Glucose-Capillary: 166 mg/dL — ABNORMAL HIGH (ref 70–99)
Glucose-Capillary: 153 mg/dL — ABNORMAL HIGH (ref 70–99)
Glucose-Capillary: 213 mg/dL — ABNORMAL HIGH (ref 70–99)

## 2018-03-29 MED ORDER — GABAPENTIN 300 MG PO CAPS
300.0000 mg | ORAL_CAPSULE | Freq: Three times a day (TID) | ORAL | Status: DC
  Administered 2018-03-29 – 2018-03-30 (×3): 300 mg via ORAL
  Filled 2018-03-29 (×3): qty 1

## 2018-03-29 MED ORDER — LIDOCAINE 5 % EX PTCH: 1.0000 | MEDICATED_PATCH | CUTANEOUS | Status: DC

## 2018-03-29 NOTE — Progress Notes (Signed)
  Progress Note    03/29/2018 1:23 PM 7 Days Post-Op  Subjective: Still having left foot pain  Vitals:   03/29/18 0941 03/29/18 1221  BP:  (!) 127/93  Pulse:  97  Resp: (!) 22 (!) 21  Temp:  (!) 97.4 F (36.3 C)  SpO2:  96%    Physical Exam: Awake alert oriented Praveena VAC in place left groin Monophasic DP signal can be traced proximal foot She still has exquisite tenderness to palpation in her dorsum of her foot as well as the sole  CBC    Component Value Date/Time   WBC 11.0 (H) 03/28/2018 0337   RBC 3.30 (L) 03/28/2018 0337   HGB 10.6 (L) 03/28/2018 0337   HCT 32.5 (L) 03/28/2018 0337   PLT 398 03/28/2018 0337   MCV 98.5 03/28/2018 0337   MCH 32.1 03/28/2018 0337   MCHC 32.6 03/28/2018 0337   RDW 11.8 03/28/2018 0337   LYMPHSABS 3.3 03/22/2018 1800   MONOABS 0.7 03/22/2018 1800   EOSABS 0.1 03/22/2018 1800   BASOSABS 0.1 03/22/2018 1800    BMET    Component Value Date/Time   NA 137 03/27/2018 0536   K 3.7 03/27/2018 0536   CL 99 03/27/2018 0536   CO2 29 03/27/2018 0536   GLUCOSE 181 (H) 03/27/2018 0536   BUN 5 (L) 03/27/2018 0536   CREATININE 0.50 03/27/2018 0536   CALCIUM 9.3 03/27/2018 0536   GFRNONAA >60 03/27/2018 0536   GFRAA >60 03/27/2018 0536    INR No results found for: INR   Intake/Output Summary (Last 24 hours) at 03/29/2018 1323 Last data filed at 03/29/2018 0950 Gross per 24 hour  Intake 100 ml  Output 750 ml  Net -650 ml     Assessment:  43 y.o. female is s/p left iliofemoral embolectomy now postoperative day 7.  Signals to the level of the foot.  JP drain is out with Praveena VAC in place.  She is on Xarelto.  She is having significant pain in her left foot still.  Plan: Lidoderm patch to left foot Continue Praveena VAC until discharge I discussed with her that pain very well may be ischemic given her known tibial disease.   Damico Partin C. [name], MD [facility] [place] Office: [phone] Pager:  [phone]  03/29/2018 1:23 PM

## 2018-03-29 NOTE — Progress Notes (Addendum)
Patient is refusing Xarelto, educated patient on the need to take medication but patient still refused.  Patient refused lunchtime and dinner insulin, education provided patient still refused.  Patient was offered lidocaine patch which she originally agreed to but when presented with the patch she declined it.  Patient had an outburst while talking on the phone will yelling and screaming with a female receiver on the line.  Patient's agitation lead to tachycardia HR in the 150s-170s.  IV metroprolol was given and HR went back to NSR in the 90's and patient relaxed after phone call ended.

## 2018-03-29 NOTE — Progress Notes (Signed)
Pt requesting help with obtaining wheelchair after DC if possible.

## 2018-03-30 LAB — GLUCOSE, CAPILLARY
Glucose-Capillary: 171 mg/dL — ABNORMAL HIGH (ref 70–99)
Glucose-Capillary: 165 mg/dL — ABNORMAL HIGH (ref 70–99)
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
## 2018-03-30 NOTE — Progress Notes (Addendum)
[facility] [place]  Subjective  - Doing better, still with pain on dorsum of foot and toes.  Loss of sensation in toes.   Objective 112/73 84 97.6 F (36.4 C) (Oral) 19 99%  Intake/Output Summary (Last 24 hours) at 03/30/2018 6147 Last data filed at 03/29/2018 2005 Gross per 24 hour  Intake 200 ml  Output 950 ml  Net -750 ml    PT/Peroneal signals on the left No open wounds, active movement of toes minimally Heart RRR Lungs non labored breathing  Assessment/Planning: POD # Left foot ischemic pain with sever tibial disease s/p embolectomy left iliac.  Possible discharge today. 3 in 1, rolling walker for home. F/U with Dr. Scot Dock in 2-3 weeks. If she tolerates the pain she may continue to have flow with collaterals, otherwise she may need an amputation if the pain.   Roxy Horseman 03/30/2018 7:11 AM --  I have interviewed the patient and examined the patient. I agree with the findings by the PA. Okay for discharge today.  She will need a walker at home.  I have again discussed with her the importance of tobacco cessation.  I encouraged her to stay as active as possible and gradually increase her activity.  We also discussed the importance of nutrition.  She is on her Xarelto.  We will follow her closely as an outpatient.  Gae Gallop, MD [phone]   Laboratory Lab Results: Recent Labs    03/28/18 0337  WBC 11.0*  HGB 10.6*  HCT 32.5*  PLT 398   BMET No results for input(s): NA, K, CL, CO2, GLUCOSE, BUN, CREATININE, CALCIUM in the last 72 hours.  COAG No results found for: INR, PROTIME No results found for: PTT

## 2018-03-30 NOTE — Progress Notes (Signed)
DME delivered; bedside comode and Toryn Dewalt. Bedside comode and Arnita Koons were placed in trunk of vehicle by person picking up the pt.  Prescriptions of Xarelto and Oxycodone provided by [facility] pharmacy. Personally picked up from inpatient pharmacy and handed to patient. Placed in patient's lap in white pt belongings bag while the pt was in the car.   Pt given paper discharge instructions. Placed in patient's lap in white pt belongings bag while pt was in the car.   Pt was provided with list of shelters - at time of discharge she decided to return home.   Telemetry discontinued. IV discontinued. Prevena wound vac DC'd by MD prior to discharge.   Discussed with the patient and all questioned fully answered. She will call me if any problems arise.  Fritz Pickerel, RN

## 2018-03-30 NOTE — Progress Notes (Signed)
CSW provided RN with taxi voucher for patient transportation home.  Thurmond Butts, MSW, [place] Social Worker [phone]

## 2018-03-30 NOTE — Progress Notes (Signed)
Pt states she will be going home at discharge and has a ride coming to pick her up. Telemetry monitor discontinued. When DME delivered will remove IV, provide discharge paperwork, call [facility] pharmacy for medication delivery. Have taxi voucher from [place] if ride ends up falling through.   Fritz Pickerel, RN

## 2018-04-03 NOTE — Discharge Summary (Signed)
[facility] Discharge Summary   Patient ID:  Megan Riddle MRN: [id number] DOB/AGE: Jul 07, 1975 43 y.o.  Admit date: 03/22/2018 Discharge date: 03/30/2018 Date of Surgery: 03/22/2018 Surgeon: Surgeon(s): Angelia Mould, MD  Admission Diagnosis: Occlusion of left iliac artery [facility]) [I74.5] Surgery follow-up [Z09]  Discharge Diagnoses:  Occlusion of left iliac artery [facility]) [I74.5] Surgery follow-up [Z09]  Secondary Diagnoses: Past Medical History:  Diagnosis Date  . Arthritis   . Cancer ([place])    breast  . Diabetes mellitus without complication ([place])     Procedure(s): LEFT ILIAC ARTERTY EMBOLECTOMY PATCH ANGIOPLASTY OF LEFT FEMORAL ARTERY Intra Operative Arteriogram LEFT LOWER LEG  Discharged Condition: stable  HPI:  43 y/o female presents with an ischemic left lower extremity and thrombus noted on CT scan in the left common iliac artery and external iliac artery.  She has no history of arrhythmias however she does admit to doing ecstasy.  She notes that sometimes she gets palpitations with this.  In addition it is possible that she has some underlying iliac artery occlusive disease that thrombosed given her history of diabetes and smoking.  She clearly has a limb threatening problem and Dr. Scot Dock recommended emergent left femoral embolectomy, possible angioplasty and stenting, possible right to left femoral-femoral bypass, and possible fasciotomies.  Hospital Course:  Megan Riddle is a 43 y.o. female is S/P  LEFT ILIAC ARTERTY EMBOLECTOMY PATCH ANGIOPLASTY OF LEFT FEMORAL ARTERY Intra Operative Arteriogram LEFT LOWER LEG Pos top JP drain placed.  > 300 cc of lymph fluid collected over several days. She was scheduled to return to the OR and the drainage finally stopped.  She was placed on Heparin until discharge and was placed on Xarelto prior to discharge.  Her echo was unremarkable.   The pathology was thrombus.  There was no obvious  embolic source. Praveena  Vac placed over groin incision and was discharged prior to discharge home.    She continued to have ischemic pain in her left foot with intact motor and was able to ambulate independently.  We explained she has flow and it should improve with continued mobility and collateral support to the foot.  If the pain gets to sever her option will be amputation.  She will be discharged with 3 in 1 and rolling walker.       Significant Diagnostic Studies: CBC Lab Results  Component Value Date   WBC 11.0 (H) 03/28/2018   HGB 10.6 (L) 03/28/2018   HCT 32.5 (L) 03/28/2018   MCV 98.5 03/28/2018   PLT 398 03/28/2018    BMET    Component Value Date/Time   NA 137 03/27/2018 0536   K 3.7 03/27/2018 0536   CL 99 03/27/2018 0536   CO2 29 03/27/2018 0536   GLUCOSE 181 (H) 03/27/2018 0536   BUN 5 (L) 03/27/2018 0536   CREATININE 0.50 03/27/2018 0536   CALCIUM 9.3 03/27/2018 0536   GFRNONAA >60 03/27/2018 0536   GFRAA >60 03/27/2018 0536   COAG No results found for: INR, PROTIME   Disposition:  Discharge to :Home Discharge Instructions    Call MD for:  redness, tenderness, or signs of infection (pain, swelling, bleeding, redness, odor or green/yellow discharge around incision site)   Complete by:  As directed    Call MD for:  severe or increased pain, loss or decreased feeling  in affected limb(s)   Complete by:  As directed    Call MD for:  temperature >100.5   Complete by:  As directed    Resume previous diet   Complete by:  As directed      Allergies as of 03/30/2018   No Known Allergies     Medication List    STOP taking these medications   glipiZIDE 5 MG tablet Commonly known as:  Glucotrol   naproxen 500 MG tablet Commonly known as:  NAPROSYN     TAKE these medications   ibuprofen 200 MG tablet Commonly known as:  ADVIL,MOTRIN Take 400-600 mg by mouth every 6 (six) hours as needed for moderate pain or cramping.   lisinopril 2.5 MG  tablet Commonly known as:  PRINIVIL,ZESTRIL Take 2.5 mg by mouth daily.   loratadine 10 MG tablet Commonly known as:  CLARITIN Take 10 mg by mouth daily as needed for allergies.   metFORMIN 500 MG tablet Commonly known as:  GLUCOPHAGE Take 2 tablets (1,000 mg total) by mouth 2 (two) times daily.   oxyCODONE 5 MG immediate release tablet Commonly known as:  Oxy IR/ROXICODONE Take 1 tablet (5 mg total) by mouth every 6 (six) hours as needed for moderate pain.   Rivaroxaban 15 & 20 MG Tbpk Take as directed on package: Start with one 20m tablet by mouth twice a day with food. On Day 22, switch to one 253mtablet once a day with food.   rivaroxaban 20 MG Tabs tablet Commonly known as:  Xarelto Take 1 tablet (20 mg total) by mouth daily with supper. Start taking on:  April 16, 2018   VITAMIN B 12 PO Take 1 tablet by mouth daily.      Verbal and written Discharge instructions given to the patient. Wound care per Discharge AVS Follow-up Information    DiAngelia MouldMD In 3 weeks.   Specialties:  Vascular Surgery, Cardiology Why:  Office will call you to arrange your appt (sent) Contact information: [id number]   Why:  Rolling walker and 3n1 arranged- to be delivered to room prior to discharge          Signed: EmRoxy Horseman/06/2018, 12:13 PM

## 2018-04-08 ENCOUNTER — Emergency Department (HOSPITAL_COMMUNITY)
Admission: EM | Admit: 2018-04-08 | Discharge: 2018-04-08 | Disposition: A | Discharge status: Home / Self Care | Payer: Medicaid Other | Attending: Emergency Medicine | Admitting: Emergency Medicine

## 2018-04-08 ENCOUNTER — Encounter: Payer: Self-pay | Admitting: Vascular Surgery

## 2018-04-08 ENCOUNTER — Other Ambulatory Visit: Payer: Self-pay

## 2018-04-08 ENCOUNTER — Emergency Department (HOSPITAL_COMMUNITY): Payer: Medicaid Other

## 2018-04-08 DIAGNOSIS — Z7984 Long term (current) use of oral hypoglycemic drugs: Secondary | ICD-10-CM | POA: Diagnosis not present

## 2018-04-08 DIAGNOSIS — Z79899 Other long term (current) drug therapy: Secondary | ICD-10-CM | POA: Insufficient documentation

## 2018-04-08 DIAGNOSIS — E119 Type 2 diabetes mellitus without complications: Secondary | ICD-10-CM | POA: Diagnosis not present

## 2018-04-08 DIAGNOSIS — F17210 Nicotine dependence, cigarettes, uncomplicated: Secondary | ICD-10-CM | POA: Diagnosis not present

## 2018-04-08 DIAGNOSIS — Z853 Personal history of malignant neoplasm of breast: Secondary | ICD-10-CM | POA: Diagnosis not present

## 2018-04-08 DIAGNOSIS — M79605 Pain in left leg: Secondary | ICD-10-CM

## 2018-04-08 LAB — BASIC METABOLIC PANEL
Anion gap: 8 (ref 5–15)
BUN: 7 mg/dL (ref 6–20)
CO2: 25 mmol/L (ref 22–32)
Calcium: 9.8 mg/dL (ref 8.9–10.3)
Chloride: 101 mmol/L (ref 98–111)
Creatinine, Ser: 0.58 mg/dL (ref 0.44–1.00)
GFR calc Af Amer: >60 mL/min (ref >60)
GFR calc non Af Amer: >60 mL/min (ref >60)
Glucose, Bld: 307 mg/dL — ABNORMAL HIGH (ref 70–99)
Potassium: 3.6 mmol/L (ref 3.5–5.1)
Sodium: 134 mmol/L — ABNORMAL LOW (ref 135–145)

## 2018-04-08 LAB — CBC WITH DIFFERENTIAL/PLATELET
Abs Immature Granulocytes: 0.03 10*3/uL (ref 0.00–0.07)
Basophils Absolute: 0.1 10*3/uL (ref 0.0–0.1)
Basophils Relative: 1 %
Eosinophils Absolute: 0.1 10*3/uL (ref 0.0–0.5)
Eosinophils Relative: 1 %
HCT: 35 % — ABNORMAL LOW (ref 36.0–46.0)
Hemoglobin: 11.3 g/dL — ABNORMAL LOW (ref 12.0–15.0)
Immature Granulocytes: 0 %
Lymphocytes Relative: 31 %
Lymphs Abs: 3.1 10*3/uL (ref 0.7–4.0)
MCH: 31 pg (ref 26.0–34.0)
MCHC: 32.3 g/dL (ref 30.0–36.0)
MCV: 96.2 fL (ref 80.0–100.0)
Monocytes Absolute: 0.6 10*3/uL (ref 0.1–1.0)
Monocytes Relative: 6 %
Neutro Abs: 6.2 10*3/uL (ref 1.7–7.7)
Neutrophils Relative %: 61 %
Platelets: 414 10*3/uL — ABNORMAL HIGH (ref 150–400)
RBC: 3.64 MIL/uL — ABNORMAL LOW (ref 3.87–5.11)
RDW: 11.4 % — ABNORMAL LOW (ref 11.5–15.5)
WBC: 10.1 10*3/uL (ref 4.0–10.5)
nRBC: 0 % (ref 0.0–0.2)

## 2018-04-08 LAB — PROTIME-INR
INR: 1.5 — ABNORMAL HIGH (ref 0.8–1.2)
Prothrombin Time: 18.4 seconds — ABNORMAL HIGH (ref 11.4–15.2)

## 2018-04-08 MED ORDER — MORPHINE SULFATE (PF) 4 MG/ML IV SOLN
4.0000 mg | Freq: Once | INTRAVENOUS | Status: AC
  Administered 2018-04-08: 4 mg via INTRAVENOUS
  Filled 2018-04-08: qty 1

## 2018-04-08 MED ORDER — OXYCODONE HCL 5 MG PO TABS: 5.0000 mg | ORAL_TABLET | Freq: Four times a day (QID) | ORAL | 0 refills | Status: AC | PRN

## 2018-04-08 MED ORDER — MORPHINE SULFATE (PF) 2 MG/ML IV SOLN
2.0000 mg | Freq: Once | INTRAVENOUS | Status: AC
  Administered 2018-04-08: 2 mg via INTRAVENOUS
  Filled 2018-04-08: qty 1

## 2018-04-08 MED ORDER — SODIUM CHLORIDE 0.9 % IV BOLUS
500.0000 mL | Freq: Once | INTRAVENOUS | Status: AC
  Administered 2018-04-08: 500 mL via INTRAVENOUS

## 2018-04-08 MED ORDER — IOHEXOL 350 MG/ML SOLN
100.0000 mL | Freq: Once | INTRAVENOUS | Status: AC | PRN
  Administered 2018-04-08: 100 mL via INTRAVENOUS

## 2018-04-08 NOTE — ED Triage Notes (Signed)
Pt c/o left leg pain x1 week, worsening today. Pt denies SOB at time of triage. Pt denies any traumatic events that may have led to the pain. Pt tearful in triage. Pt had recent ED visit for same. States pain has gotten worse and medication is not helping to reduce the pain. Also c/o knot that has formed on dorsal region of left foot.

## 2018-04-08 NOTE — ED Provider Notes (Signed)
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  She was discharged on March 30, 2018 status post emergent left femoral embolectomy.  Patient notes that when she was discharged from the hospital she did have pain in the left leg she notes this is progressively worsened over the last 2 days severe pain not improved with home pain medication.  She notes that originally she did have swelling in the left lower extremity but this is improved but has begun to swell along the dorsum of the foot again.  She denies any discoloration of the feet, denies any sensory deficits.  She notes the pain radiates from her hip down to her toes.  Denies any abdominal pain chest pain or shortness of breath.  Lower extremities without significant swelling or edema, minimal dorsal edema on the left foot, nonpalpable pedal and posterior tibial pulse, dopplerable posterior tibial left, no pedal at this time-cap refill intact to the toes  Patient was evaluated in green zone I do believe she needs further evaluation management in an acute bed.  Initial labs and imaging will be placed patient will be moved.  Today's Vitals   04/08/18 1349  BP: 122/86  Pulse: (!) 117  Resp: 20  Temp: 98.9 F (37.2 C)  TempSrc: Oral  SpO2: 98%   There is no height or weight on file to calculate BMI.   Okey Regal, PA-C 04/08/18 1432    Maudie Flakes, MD 04/08/18 [phone]

## 2018-04-08 NOTE — Discharge Instructions (Addendum)
All your laboratory results and imaging were within normal limits today, please continue to take your Xarelto daily.Please call Dr. Scot Dock office to schedule an appointment to see you by the end of this week.  I have provided pain medication please take for severe pain, you may also use ibuprofen with this medication.  If you experience any fever, shortness of breath or chest pain you may return to the ED.

## 2018-04-08 NOTE — ED Provider Notes (Signed)
[place] EMERGENCY DEPARTMENT Provider Note   CSN: [id number] Arrival date & time: 04/08/18  1346    History   Chief Complaint Chief Complaint  Patient presents with  . Leg Pain    HPI Megan Riddle is a 43 y.o. female.     43 y.o smoker female with a PMH of DM, Occlusion of left Iliac artery  S/p femoral-femoral bypass graft 03/30/2018 by Dr. Scot Dock. Patient reports she was discharged with pain medication which has been taking Percocet fives without relieving her pain.  She reports she notes her foot feels somewhat swollen, states she is unable to weight-bear on it, she reports pain with light touch to the dorsal aspect of her foot.  Patient reports she has a follow-up appointment with Dr. Doren Custard in 3 weeks. She reports no trauma, states she's been elevating her legs without relieve. According to Dr. Nicole Cella note, if patient's pain has not improved she "might need to have amputation to the limb".      Past Medical History:  Diagnosis Date  . Arthritis   . Cancer ([place])    breast  . Diabetes mellitus without complication [facility])     Patient Active Problem List   Diagnosis Date Noted  . Occlusion of left iliac artery ([place]) 03/23/2018  . Sensation of cold in leg 03/22/2018  . OSA (obstructive sleep apnea) 02/20/2016  . Tobacco user 02/20/2016    Past Surgical History:  Procedure Laterality Date  . FEMORAL-FEMORAL BYPASS GRAFT Left 03/22/2018   Procedure: LEFT ILIAC ARTERTY EMBOLECTOMY;  Surgeon: Angelia Mould, MD;  Location: [place];  Service: Vascular;  Laterality: Left;  . INTRAOPERATIVE ARTERIOGRAM Left 03/22/2018   Procedure: Intra Operative Arteriogram LEFT LOWER LEG;  Surgeon: Angelia Mould, MD;  Location: [place];  Service: Vascular;  Laterality: Left;  . PATCH ANGIOPLASTY Left 03/22/2018   Procedure: PATCH ANGIOPLASTY OF LEFT FEMORAL ARTERY;  Surgeon: Angelia Mould, MD;  Location[facility] OR;  Service: Vascular;   Laterality: Left;     OB History   No obstetric history on file.      Home Medications    Prior to Admission medications   Medication Sig Start Date End Date Taking? Authorizing Provider  Cyanocobalamin (VITAMIN B 12 PO) Take 1 tablet by mouth daily.   Yes [provider]  lisinopril (PRINIVIL,ZESTRIL) 2.5 MG tablet Take 2.5 mg by mouth daily. 12/04/17  Yes [provider]  metFORMIN (GLUCOPHAGE) 500 MG tablet Take 2 tablets (1,000 mg total) by mouth 2 (two) times daily. 12/19/15  Yes Antonietta Breach, PA-C  Multiple Vitamin (MULTIVITAMIN WITH MINERALS) TABS tablet Take 1 tablet by mouth daily.   Yes [provider]  Rivaroxaban 15 & 20 MG TBPK Take as directed on package: Start with one 70m tablet by mouth twice a day with food. On Day 22, switch to one 235mtablet once a day with food. Patient taking differently: Take 15-20 mg by mouth See admin instructions. Take as directed on package: Start with one 159mablet by mouth twice a day with food. On Day 22, switch to one 64m77mblet once a day with food. 03/24/18  Yes Rhyne, Samantha J, PA-C  oxyCODONE (OXY IR/ROXICODONE) 5 MG immediate release tablet Take 1 tablet (5 mg total) by mouth every 6 (six) hours as needed for up to 3 days for moderate pain. 04/08/18 04/11/18  SotoJaneece Fitting-C  rivaroxaban (XARELTO) 20 MG TABS tablet Take 1 tablet (20 mg total) by mouth  daily with supper. 04/16/18   Gabriel Earing, PA-C    Family History No family history on file.  Social History Social History   Tobacco Use  . Smoking status: Current Every Day Smoker    Packs/day: 0.50    Years: 27.00    Pack years: 13.50  . Smokeless tobacco: Never Used  . Tobacco comment: patient stated she has the patch and materials  Substance Use Topics  . Alcohol use: Yes  . Drug use: Yes    Types: Marijuana     Allergies   Patient has no known allergies.   Review of Systems Review of Systems  Constitutional: Negative for  chills and fever.  HENT: Negative for ear pain and sore throat.   Eyes: Negative for pain and visual disturbance.  Respiratory: Negative for cough and shortness of breath.   Cardiovascular: Positive for leg swelling (mild). Negative for chest pain and palpitations.  Gastrointestinal: Negative for abdominal pain and vomiting.  Genitourinary: Negative for dysuria and hematuria.  Musculoskeletal: Negative for arthralgias and back pain.  Skin: Negative for color change and rash.  Neurological: Negative for seizures and syncope.  All other systems reviewed and are negative.    Physical Exam Updated Vital Signs BP 122/86 (BP Location: Right Arm)   Pulse 96   Temp 98.9 F (37.2 C) (Oral)   Resp 20   LMP 12/20/2017 (Approximate)   SpO2 99%   Physical Exam Vitals signs and nursing note reviewed.  Constitutional:      General: She is not in acute distress.    Appearance: She is well-developed. She is ill-appearing.  HENT:     Head: Normocephalic and atraumatic.     Mouth/Throat:     Pharynx: No oropharyngeal exudate.  Eyes:     Pupils: Pupils are equal, round, and reactive to light.  Neck:     Musculoskeletal: Normal range of motion.  Cardiovascular:     Rate and Rhythm: Regular rhythm.     Pulses:          Dorsalis pedis pulses are detected w/ Doppler on the left side.       Posterior tibial pulses are detected w/ Doppler on the left side.     Heart sounds: Normal heart sounds.  Pulmonary:     Effort: Pulmonary effort is normal. No respiratory distress.     Breath sounds: No wheezing or rhonchi.     Comments: Lungs are diminished.  Abdominal:     General: Bowel sounds are normal. There is no distension.     Palpations: Abdomen is soft.     Tenderness: There is no abdominal tenderness.  Musculoskeletal:        General: No tenderness or deformity.     Right lower leg: No edema.     Left lower leg: No edema.     Left foot: Decreased range of motion.       Feet:  Feet:      Left foot:     Skin integrity: Warmth present.     Comments: Pain with light touch along dorsum aspect of her left foot. PT pulse present, limited ROM due to pain.  Skin:    General: Skin is warm and dry.  Neurological:     Mental Status: She is alert and oriented to person, place, and time.      ED Treatments / Results  Labs (all labs ordered are listed, but only abnormal results are displayed) Labs Reviewed  CBC WITH  DIFFERENTIAL/PLATELET - Abnormal; Notable for the following components:      Result Value   RBC 3.64 (*)    Hemoglobin 11.3 (*)    HCT 35.0 (*)    RDW 11.4 (*)    Platelets 414 (*)    All other components within normal limits  BASIC METABOLIC PANEL - Abnormal; Notable for the following components:   Sodium 134 (*)    Glucose, Bld 307 (*)    All other components within normal limits  PROTIME-INR - Abnormal; Notable for the following components:   Prothrombin Time 18.4 (*)    INR 1.5 (*)    All other components within normal limits    EKG None  Radiology Ct Angio Aortobifemoral W And/or Wo Contrast  Result Date: 04/08/2018 CLINICAL DATA:  43 year old female with progressive left lower extremity pain EXAM: CT ANGIOGRAPHY OF ABDOMINAL AORTA WITH ILIOFEMORAL RUNOFF TECHNIQUE: Multidetector CT imaging of the abdomen, pelvis and lower extremities was performed using the standard protocol during bolus administration of intravenous contrast. Multiplanar CT image reconstructions and MIPs were obtained to evaluate the vascular anatomy. CONTRAST:  169m OMNIPAQUE IOHEXOL 350 MG/ML SOLN COMPARISON:  Prior runoff CTA 03/22/2018 FINDINGS: VASCULAR Aorta: Normal caliber aorta without aneurysm, dissection, vasculitis or significant stenosis. Celiac: Patent without evidence of aneurysm, dissection, vasculitis or significant stenosis. SMA: Patent without evidence of aneurysm, dissection, vasculitis or significant stenosis. Renals: Both renal arteries are patent without evidence  of aneurysm, dissection, vasculitis, fibromuscular dysplasia or significant stenosis. IMA: Patent without evidence of aneurysm, dissection, vasculitis or significant stenosis. RIGHT Lower Extremity Inflow: Common, internal and external iliac arteries are patent without evidence of aneurysm, dissection, vasculitis or significant stenosis. Outflow: Common, superficial and profunda femoral arteries and the popliteal artery are patent without evidence of aneurysm, dissection, vasculitis or significant stenosis. Runoff: Patent three vessel runoff to the ankle. LEFT Lower Extremity Inflow: Minimal amount of fibrofatty atherosclerotic plaque in the posterior left common iliac artery without evidence of significant stenosis. The left internal iliac artery remains occluded. The left external iliac artery is widely patent. Outflow: Surgical changes of prior left groin cutdown. No significant stenosis, occlusion, aneurysm or dissection. Runoff: Patent 3 vessel runoff to the ankle. Veins: No focal venous abnormality. Review of the MIP images confirms the above findings. NON-VASCULAR Lower chest: The lung bases are clear. Visualized cardiac structures are within normal limits for size. No pericardial effusion. Unremarkable visualized distal thoracic esophagus. Hepatobiliary: Normal hepatic contour and morphology. No discrete hepatic lesions. Normal appearance of the gallbladder. No intra or extrahepatic biliary ductal dilatation. Pancreas: Unremarkable. No pancreatic ductal dilatation or surrounding inflammatory changes. Spleen: Normal in size without focal abnormality. Adrenals/Urinary Tract: Adrenal glands are unremarkable. Kidneys are normal, without renal calculi, focal lesion, or hydronephrosis. Bladder is unremarkable. Stomach/Bowel: No evidence of obstruction or focal bowel wall thickening. Normal appendix in the right lower quadrant. The terminal ileum is unremarkable. Lymphatic: No suspicious lymphadenopathy. There are  a few small reactive nodes in the left superficial inguinal nodal station. Reproductive: Large heterogeneous multi fibroid uterus. The largest fibroid in the left aspect of the uterus demonstrates central necrosis. Other: Surgical changes of prior left groin cutdown with residual postoperative inflammatory changes. No discrete fluid collection or abscess. Musculoskeletal: No acute fracture or aggressive appearing lytic or blastic osseous lesion. IMPRESSION: VASCULAR 1. No acute arterial thromboembolic disease, dissection or other acute abnormality. 2. Persistent occlusion of the left internal iliac artery. NON-VASCULAR 1. Surgical changes of prior left groin cutdown with residual post  operative inflammatory changes. No discrete fluid collection to suggest the presence of a seroma or abscess. A few small lymph nodes in the area are likely reactive. Recommend clinical correlation for signs and symptoms of cellulitis. 2. Uterine fibroids. Electronically Signed   By: Jacqulynn Cadet M.D.   On: 04/08/2018 17:17    Procedures Procedures (including critical care time)  Medications Ordered in ED Medications  morphine 4 MG/ML injection 4 mg (4 mg Intravenous Given 04/08/18 1514)  sodium chloride 0.9 % bolus 500 mL (500 mLs Intravenous New Bag/Given 04/08/18 1758)  iohexol (OMNIPAQUE) 350 MG/ML injection 100 mL (100 mLs Intravenous Contrast Given 04/08/18 1654)  morphine 2 MG/ML injection 2 mg (2 mg Intravenous Given 04/08/18 1758)     Initial Impression / Assessment and Plan / ED Course  I have reviewed the triage vital signs and the nursing notes.  Pertinent labs & imaging results that were available during my care of the patient were reviewed by me and considered in my medical decision making (see chart for details).      Patient with a previous history of celiac artery occlusion, had repair on February 22 of 2020 by Dr. Scot Dock  She reports the pain has worsened after her surgery.  She reports she was  taking hydrocodone 35m and states this medication was not helping.  She also noted swelling to her left foot along with pain to the touch.  CT angio was ordered to evaluate for new occlusion. During evaluation patient's pulses are present with Doppler.  She has limited range of motion as she reports is very painful with extension and flexion.  Patient is currently on Xarelto, reports taking her medication with compliance but did miss 1 dose.  CBC showed no leukocytosis, skin is not warm to the touch, no skin changes or weeping, low suspicion for cellulitis. BMP showed no electrolyte abnormality, creatinine level is unremarkable.  Hemoglobin stable.  PT and INR are therapeutic.  She denies any shortness of breath, calf tenderness. She is currently on xarelto, low suspicion for DVT. Most of her pain is along the dorsum aspect of the left foot.  Incision above her groin looks well-appearing, no drainage or erythema present. CT angio Artobifemoral showed: 1. No acute arterial thromboembolic disease, dissection or other  acute abnormality.  2. Persistent occlusion of the left internal iliac artery.      6:03 PM Spoke to Dr. BChaya Janwho reviewed CT Angio and reports there is not acute process at this time. Patient is likely to have pain from her post op, no new occlusion.  Patient was informed of her results, she reports she is in significant amount of pain and cannot wait until her surgeon refills her medications.  We will provide her with 3 days worth of roxicodone.  She is encouraged to follow-up with her surgeon, reports she will make an appointment to see him by the end of the week.  Patient understands and agrees with management.  Return precautions provided.  Final Clinical Impressions(s) / ED Diagnoses   Final diagnoses:  Left leg pain    ED Discharge Orders         Ordered    oxyCODONE (OXY IR/ROXICODONE) 5 MG immediate release tablet  Every 6 hours PRN     04/08/18 1828            SJaneece Fitting PA-C 04/08/18 1830    PCharlesetta Shanks MD 04/11/18 1320

## 2018-04-08 NOTE — ED Notes (Signed)
Patient verbalizes understanding of discharge instructions. Opportunity for questioning and answers were provided. Armband removed by staff, pt discharged from ED.  

## 2018-04-22 ENCOUNTER — Ambulatory Visit (INDEPENDENT_AMBULATORY_CARE_PROVIDER_SITE_OTHER): Payer: Self-pay | Admitting: Vascular Surgery

## 2018-04-22 ENCOUNTER — Encounter: Payer: Self-pay | Admitting: Vascular Surgery

## 2018-04-22 ENCOUNTER — Encounter: Payer: Self-pay | Admitting: Family

## 2018-04-22 ENCOUNTER — Other Ambulatory Visit: Payer: Self-pay

## 2018-04-22 VITALS — BP 99/66 | HR 85 | Temp 97.8°F | Resp 20 | Ht 62.0 in | Wt 196.0 lb

## 2018-04-22 DIAGNOSIS — I745 Embolism and thrombosis of iliac artery: Secondary | ICD-10-CM

## 2018-04-22 MED ORDER — RIVAROXABAN 20 MG PO TABS: 20.0000 mg | ORAL_TABLET | Freq: Every day | ORAL | 5 refills | Status: DC

## 2018-04-22 NOTE — Progress Notes (Signed)
Patient name: Megan Riddle MRN: [id number] DOB: [birth year] Sex: female  REASON FOR VISIT:   Follow-up after left iliofemoral thrombectomy  HPI:   Megan Riddle is a pleasant 43 y.o. female who had presented with an ischemic left lower extremity and underwent a left iliofemoral thrombectomy and vein patch angioplasty of the left common femoral artery on 03/22/2018.  Her echo was unremarkable with no cardiac source for embolization.  However given this events I did recommend Xarelto which she is currently on.  At the time of her discharge she had a biphasic peroneal signal with the Doppler and a monophasic posterior tibial and dorsalis pedis signal.  She had evidence of tibial disease.  She did have some persistent lymphatic drainage from her JP but ultimately this was discontinued.  She comes in for a follow-up visit.  Patient overall is doing well and the pain in her left foot has improved significantly.  She smokes 2 cigarettes a day.  She is on Xarelto.  She describes some pain in her foot at rest but this has gradually been improving.  Current Outpatient Medications  Medication Sig Dispense Refill   Cyanocobalamin (VITAMIN B 12 PO) Take 1 tablet by mouth daily.     ibuprofen (ADVIL,MOTRIN) 200 MG tablet Take by mouth.     lisinopril (PRINIVIL,ZESTRIL) 2.5 MG tablet Take 2.5 mg by mouth daily.  11   metFORMIN (GLUCOPHAGE) 500 MG tablet Take 2 tablets (1,000 mg total) by mouth 2 (two) times daily. 60 tablet 0   Multiple Vitamin (MULTIVITAMIN WITH MINERALS) TABS tablet Take 1 tablet by mouth daily.     rivaroxaban (XARELTO) 20 MG TABS tablet Take 1 tablet (20 mg total) by mouth daily with supper. 30 tablet 2   Rivaroxaban 15 & 20 MG TBPK Take as directed on package: Start with one 25m tablet by mouth twice a day with food. On Day 22, switch to one 235mtablet once a day with food. (Patient taking differently: Take 15-20 mg by mouth See admin instructions. Take as directed on  package: Start with one 1535mablet by mouth twice a day with food. On Day 22, switch to one 56m2mblet once a day with food.) 51 each 0   Vitamins/Minerals TABS Take by mouth.     No current facility-administered medications for this visit.     REVIEW OF SYSTEMS:  [X]  denotes positive finding, [ ]  denotes negative finding Vascular    Leg swelling    Cardiac    Chest pain or chest pressure:    Shortness of breath upon exertion:    Short of breath when lying flat:    Irregular heart rhythm:    Constitutional    Fever or chills:     PHYSICAL EXAM:   Vitals:   04/22/18 0934  BP: 99/66  Pulse: 85  Resp: 20  Temp: 97.8 F (36.6 C)  SpO2: 96%  Weight: 196 lb (88.9 kg)  Height: 5' 2"  (1.575 m)    GENERAL: The patient is a well-nourished female, in no acute distress. The vital signs are documented above. CARDIOVASCULAR: There is a regular rate and rhythm. PULMONARY: There is good air exchange bilaterally without wheezing or rales. She has palpable femoral pulses. On the left side she has a fairly brisk peroneal dorsalis pedis and posterior tibial signal. On the right side she has a brisk peroneal, dorsalis pedis, and posterior tibial signal.  DATA:   No new data  MEDICAL ISSUES:   STATUS POST LEFT  ILIOFEMORAL THROMBECTOMY: The patient is doing well with excellent Doppler signals in the left foot now.  She is on Xarelto.  The etiology of her thrombus is not clear.  Her echo was unremarkable.  I have written her a prescription for Xarelto and I think we need to continue this for 6 months.  I have ordered a follow-up visit in 6 months at which time we will obtain ABIs.  I will also obtain a CT of the chest abdomen and pelvis to look for potential source of embolization.  The only other consideration would be a hypercoagulable work-up.  Deitra Mayo [facility] [phone]

## 2018-04-28 ENCOUNTER — Ambulatory Visit (HOSPITAL_COMMUNITY): Payer: Self-pay | Admitting: Psychiatry

## 2018-09-03 ENCOUNTER — Ambulatory Visit (HOSPITAL_COMMUNITY)
Admission: EM | Admit: 2018-09-03 | Discharge: 2018-09-03 | Disposition: A | Discharge status: Home / Self Care | Payer: Medicaid Other | Attending: Urgent Care | Admitting: Urgent Care

## 2018-09-03 ENCOUNTER — Other Ambulatory Visit: Payer: Self-pay

## 2018-09-03 ENCOUNTER — Ambulatory Visit (INDEPENDENT_AMBULATORY_CARE_PROVIDER_SITE_OTHER): Payer: Medicaid Other

## 2018-09-03 ENCOUNTER — Encounter (HOSPITAL_COMMUNITY): Payer: Self-pay

## 2018-09-03 DIAGNOSIS — R140 Abdominal distension (gaseous): Secondary | ICD-10-CM

## 2018-09-03 DIAGNOSIS — I745 Embolism and thrombosis of iliac artery: Secondary | ICD-10-CM

## 2018-09-03 DIAGNOSIS — K5900 Constipation, unspecified: Secondary | ICD-10-CM | POA: Diagnosis not present

## 2018-09-03 DIAGNOSIS — I70202 Unspecified atherosclerosis of native arteries of extremities, left leg: Secondary | ICD-10-CM

## 2018-09-03 DIAGNOSIS — R12 Heartburn: Secondary | ICD-10-CM

## 2018-09-03 DIAGNOSIS — M79672 Pain in left foot: Secondary | ICD-10-CM

## 2018-09-03 DIAGNOSIS — K219 Gastro-esophageal reflux disease without esophagitis: Secondary | ICD-10-CM

## 2018-09-03 DIAGNOSIS — M79605 Pain in left leg: Secondary | ICD-10-CM

## 2018-09-03 DIAGNOSIS — M545 Low back pain, unspecified: Secondary | ICD-10-CM

## 2018-09-03 MED ORDER — POLYETHYLENE GLYCOL 3350 17 G PO PACK: 17.0000 g | PACK | Freq: Every day | ORAL | 0 refills | Status: DC | PRN

## 2018-09-03 MED ORDER — ESOMEPRAZOLE MAGNESIUM 20 MG PO CPDR: 20.0000 mg | DELAYED_RELEASE_CAPSULE | Freq: Every day | ORAL | 0 refills | Status: DC

## 2018-09-03 NOTE — Discharge Instructions (Addendum)
Please use Miralax or a Fleet enema for moderate to severe constipation. Take this once a day for the next 2 days. Please also start Colace (docusate) stool softener, twice a day for at least 1 week. If stools become loose, cut down to once a day for another week. If stools remain loose, cut back to 1 pill every other day for a third week. You can stop docusate thereafter and resume as needed for constipation.  To help reduce constipation and promote bowel health: 1. Drink at least 64 ounces of water each day 2. Eat plenty of fiber (fruits, vegetables, whole grains, legumes) 3. Be physically active or exercise including walking, jogging, swimming, yoga, etc. 4. For active constipation use a stool softener (docusate) or an osmotic laxative (like Miralax) each day, or as needed.

## 2018-09-03 NOTE — ED Triage Notes (Signed)
Pt states she had a blood clot a while back and now she is having pain in her left foot , burning in her left leg and lower back. Pt states she has been dealing with constipation as well. Pt states this has been going on 2 weeks or more.

## 2018-09-03 NOTE — ED Provider Notes (Addendum)
MRN: [id number] DOB: 06-12-1975  Subjective:   Megan Riddle is a 43 y.o. female presenting for 3 day history of recurrent left leg pain, worse in the left foot. Reports that she has burning type sensation of her toes that has now spread to her foot and joint. She has pain throughout her left leg, including calf and thigh. Has pain of her low back, worse on left side and radiates up to her upper left back. She has a hx significant for PAD. Imaging results below. She underwent a left iliofemoral thrombectomy and vein patch angioplasty of the left common femoral artery on 03/22/2018. She is taking Xarelto 14m QD now. Patient has video visit scheduled with her PCP on 09/11/2018. Has longstanding history of constipation. Has not had a bowel movement in 2 days. But takes fiber supplement and has had difficulty with constipation in the past 2 weeks. Has worked with her PCP on this issue. Reports that it is now causing significant heartburn and can't burp. She would like medication for this and her constipation. States that she already tried 1 fleet enema ~2-3 days ago. Patient tries to eat healthily. Denies history of abdominal surgeries.    No current facility-administered medications for this encounter.   Current Outpatient Medications:  .  Cyanocobalamin (VITAMIN B 12 PO), Take 1 tablet by mouth daily., Disp: , Rfl:  .  ibuprofen (ADVIL,MOTRIN) 200 MG tablet, Take by mouth., Disp: , Rfl:  .  lisinopril (PRINIVIL,ZESTRIL) 2.5 MG tablet, Take 2.5 mg by mouth daily., Disp: , Rfl: 11 .  metFORMIN (GLUCOPHAGE) 500 MG tablet, Take 2 tablets (1,000 mg total) by mouth 2 (two) times daily., Disp: 60 tablet, Rfl: 0 .  Multiple Vitamin (MULTIVITAMIN WITH MINERALS) TABS tablet, Take 1 tablet by mouth daily., Disp: , Rfl:  .  rivaroxaban (XARELTO) 20 MG TABS tablet, Take 1 tablet (20 mg total) by mouth daily with supper., Disp: 30 tablet, Rfl: 2 .  rivaroxaban (XARELTO) 20 MG TABS tablet, Take 1 tablet (20 mg  total) by mouth daily with supper., Disp: 30 tablet, Rfl: 5 .  Rivaroxaban 15 & 20 MG TBPK, Take as directed on package: Start with one 131mtablet by mouth twice a day with food. On Day 22, switch to one 2084mablet once a day with food. (Patient taking differently: Take 15-20 mg by mouth See admin instructions. Take as directed on package: Start with one 21m72mblet by mouth twice a day with food. On Day 22, switch to one 20mg37mlet once a day with food.), Disp: 51 each, Rfl: 0 .  Vitamins/Minerals TABS, Take by mouth., Disp: , Rfl:     No Known Allergies   Past Medical History:  Diagnosis Date  . Arthritis   . Cancer (HCC) [facility]  . [facility] without complication ([facility]  Past Surgical History:  Procedure Laterality Date  . FEMORAL-FEMORAL BYPASS GRAFT Left 03/22/2018   Procedure: LEFT ILIAC ARTERTY EMBOLECTOMY;  Surgeon: DicksAngelia Mould  [facility];  Laterality: Left;  . INTRAOPERATIVE ARTERIOGRAM Left 03/22/2018   Procedure: Intra Operative Arteriogram LEFT LOWER LEG;  Surgeon: DicksAngelia Mould  [facility] Vascular;  Laterality: Left;  . PATCH ANGIOPLASTY Left 03/22/2018   Procedure: PATCH ANGIOPLASTY OF LEFT FEMORAL ARTERY;  Surgeon: DicksAngelia Mould  [facility];  Laterality: Left;    Review of Systems  Constitutional: Positive for malaise/fatigue.  Negative for fever.  HENT: Negative for congestion, ear pain, sinus pain and sore throat.   Eyes: Negative for blurred vision, double vision, discharge and redness.  Respiratory: Negative for cough, hemoptysis, shortness of breath and wheezing.   Cardiovascular: Negative for chest pain.  Gastrointestinal: Positive for abdominal pain (bloating, epigastric area), constipation, heartburn and nausea. Negative for diarrhea and vomiting.  Genitourinary: Negative for dysuria, flank pain and hematuria.  Musculoskeletal: Positive  for back pain and myalgias (left leg).  Skin: Negative for rash.  Neurological: Positive for tingling. Negative for dizziness, weakness and headaches.  Psychiatric/Behavioral: Negative for depression and substance abuse.    Objective:   Vitals: BP 107/72 (BP Location: Right Arm)   Pulse 76   Temp 98.7 F (37.1 C) (Oral)   Resp 18   Wt 215 lb (97.5 kg)   LMP 12/20/2017 (Approximate)   SpO2 98%   BMI 39.32 kg/m   Physical Exam Constitutional:      General: She is not in acute distress.    Appearance: Normal appearance. She is well-developed. She is not ill-appearing, toxic-appearing or diaphoretic.  HENT:     Head: Normocephalic and atraumatic.     Nose: Nose normal.     Mouth/Throat:     Mouth: Mucous membranes are moist.  Eyes:     Extraocular Movements: Extraocular movements intact.     Pupils: Pupils are equal, round, and reactive to light.  Cardiovascular:     Rate and Rhythm: Normal rate and regular rhythm.     Pulses: Normal pulses.     Heart sounds: Normal heart sounds. No murmur. No friction rub. No gallop.   Pulmonary:     Effort: Pulmonary effort is normal. No respiratory distress.     Breath sounds: Normal breath sounds. No stridor. No wheezing, rhonchi or rales.  Musculoskeletal:        General: Tenderness (over left calf, left thigh, most of her left foot) present. No swelling.     Lumbar back: She exhibits tenderness (over areas depicted) and pain. She exhibits normal range of motion, no edema and no deformity.       Back:     Right lower leg: No edema.     Left lower leg: No edema.     Comments: DP is 1+ left side.  Skin:    General: Skin is warm and dry.     Findings: No rash.  Neurological:     Mental Status: She is alert and oriented to person, place, and time.  Psychiatric:        Mood and Affect: Mood normal.        Behavior: Behavior normal.        Thought Content: Thought content normal.     EXAM: CT ANGIOGRAPHY OF ABDOMINAL AORTA WITH  ILIOFEMORAL RUNOFF IMPRESSION: VASCULAR 1. No acute arterial thromboembolic disease, dissection or other acute abnormality. 2. Persistent occlusion of the left internal iliac artery.  NON-VASCULAR  1. Surgical changes of prior left groin cutdown with residual post operative inflammatory changes. No discrete fluid collection to suggest the presence of a seroma or abscess. A few small lymph nodes in the area are likely reactive. Recommend clinical correlation for signs and symptoms of cellulitis. 2. Uterine fibroids.   Electronically Signed   By: Jacqulynn Cadet M.D.   On: 04/08/2018 17:17   EXAM: LEFT ANG/EXT/UNI/ OR  COMPARISON:  CTA on 03/22/2018  FINDINGS: Intraoperative arteriogram centered over the left knee demonstrates normal patency of the  visualized distal left SFA, popliteal artery, proximal anterior tibial artery and proximal posterior tibial artery. There is an occlusion of the peroneal artery at the juncture of the proximal and mid calf.  IMPRESSION: Normal patency of visualized left lower extremity arteries except for apparent occlusion of the peroneal artery.   Electronically Signed   By: Aletta Edouard M.D.   On: 03/23/2018 07:47   Dg Lumbar Spine Complete  Result Date: 09/03/2018 CLINICAL DATA:  Low back pain with left leg pain EXAM: LUMBAR SPINE - COMPLETE 4+ VIEW COMPARISON:  None. FINDINGS: Normal alignment. No fracture or mass. Disc spaces normal. Left facet degeneration L5-S1. Negative for pars defect. SI joints intact. IMPRESSION: Left facet degeneration at L5-S1.  No acute abnormality. Electronically Signed   By: Franchot Gallo M.D.   On: 09/03/2018 11:55    Assessment and Plan :   1. Left foot pain   2. Constipation, unspecified constipation type   3. Left leg pain   4. Peroneal artery occlusion, left ([place])   5. Acute left-sided low back pain, unspecified whether sciatica present   6. Heartburn   7. Occlusion of left iliac artery  (HCC)   8. Gastroesophageal reflux disease without esophagitis   9. Abdominal bloating     Attempted to call vascular surgery clinic multiple times for recommendations. PE findings not overtly acute. Patient is high risk but with vitals being stable, her being on Xarelto, known hx of PAD, unremarkable x-ray imaging counseled patient that close f/u with the vascular surgery clinic is more appropriate than the ER. Unfortunately, we cannot schedule the kind of imaging she needs as an outpatient. She was agreeable to contact vascular clinic for an urgent appt. Discussed maintenance management of constipation and GERD. Follow up with PCP. Counseled patient on potential for adverse effects with medications prescribed/recommended today, ER and return-to-clinic precautions discussed, patient verbalized understanding.   Jaynee Eagles, [place] 09/03/18 1621

## 2018-09-11 DIAGNOSIS — H6502 Acute serous otitis media, left ear: Secondary | ICD-10-CM | POA: Insufficient documentation

## 2018-09-16 ENCOUNTER — Other Ambulatory Visit: Payer: Self-pay | Admitting: Vascular Surgery

## 2018-09-16 DIAGNOSIS — I745 Embolism and thrombosis of iliac artery: Secondary | ICD-10-CM

## 2018-10-01 ENCOUNTER — Telehealth: Payer: Self-pay | Admitting: *Deleted

## 2018-10-01 NOTE — Telephone Encounter (Signed)
Received a call from Freda Munro, Dr. Marla Roe nurse at [facility] in [place] [phone]) regarding the need to hold this patient's Xarelto x 2 days prior to their endoscopic procedure. Patient is coming in to see Dr. Scot Dock on 10-21-18 for her 6 month followup with CTA and ABIs; she is S/P left iliac artery embolectomy on 03-22-18. I told Freda Munro that the patient would be evaluated and we will let them know when she may stop her Xarelto depending on her scans. We can fax a note to Dr. Cindee Salt at [phone].

## 2018-10-09 ENCOUNTER — Emergency Department (HOSPITAL_COMMUNITY)
Admission: EM | Admit: 2018-10-09 | Discharge: 2018-10-09 | Disposition: A | Discharge status: Home / Self Care | Payer: Medicaid Other | Attending: Emergency Medicine | Admitting: Emergency Medicine

## 2018-10-09 ENCOUNTER — Encounter (HOSPITAL_COMMUNITY): Payer: Self-pay | Admitting: Family Medicine

## 2018-10-09 DIAGNOSIS — Z5321 Procedure and treatment not carried out due to patient leaving prior to being seen by health care provider: Secondary | ICD-10-CM | POA: Diagnosis not present

## 2018-10-09 DIAGNOSIS — F419 Anxiety disorder, unspecified: Secondary | ICD-10-CM | POA: Diagnosis not present

## 2018-10-09 NOTE — ED Triage Notes (Signed)
Patient is from home and transported via [facility]. According to EMS, patient got in arguement with her wife and started to have an anxiety attack. Patient suffers from PTSD. Patient appears in calm and cooperative during triage.

## 2018-10-16 ENCOUNTER — Inpatient Hospital Stay: Admission: RE | Admit: 2018-10-16 | Payer: Medicaid Other | Source: Ambulatory Visit

## 2018-10-16 ENCOUNTER — Other Ambulatory Visit: Payer: Medicaid Other

## 2018-10-20 ENCOUNTER — Other Ambulatory Visit: Payer: Self-pay | Admitting: Vascular Surgery

## 2018-10-20 DIAGNOSIS — I745 Embolism and thrombosis of iliac artery: Secondary | ICD-10-CM

## 2018-10-21 ENCOUNTER — Ambulatory Visit (INDEPENDENT_AMBULATORY_CARE_PROVIDER_SITE_OTHER): Payer: Medicaid Other | Admitting: Vascular Surgery

## 2018-10-21 ENCOUNTER — Encounter: Payer: Self-pay | Admitting: Vascular Surgery

## 2018-10-21 ENCOUNTER — Ambulatory Visit (HOSPITAL_COMMUNITY)
Admission: RE | Admit: 2018-10-21 | Discharge: 2018-10-21 | Disposition: A | Discharge status: Home / Self Care | Payer: Medicaid Other | Source: Ambulatory Visit | Attending: Family | Admitting: Family

## 2018-10-21 ENCOUNTER — Other Ambulatory Visit: Payer: Self-pay

## 2018-10-21 VITALS — BP 111/78 | HR 82 | Temp 97.6°F | Resp 10 | Ht 61.0 in | Wt 204.0 lb

## 2018-10-21 DIAGNOSIS — I745 Embolism and thrombosis of iliac artery: Secondary | ICD-10-CM

## 2018-10-21 NOTE — Progress Notes (Signed)
Patient name: Megan Riddle MRN: [id number] DOB: [birth year] Sex: female  REASON FOR VISIT:   Follow-up after left iliofemoral thrombectomy and vein patch angioplasty of the left common femoral artery.  HPI:   Megan Riddle is a pleasant 43 y.o. female who had presented with an ischemic left lower extremity secondary to a left common and external iliac artery embolus.  On 03/22/2018 she underwent left iliofemoral thrombectomy with vein patch angioplasty of the left common femoral artery using saphenous vein.  Intraoperative arteriogram showed three-vessel runoff.  I last saw the patient on 04/22/2018.  At that time she had good Doppler signals in the left foot.  She was on Xarelto.  The etiology of her thrombus was not clear.  Her echo was unremarkable.  I thought we should continue the Xarelto for 6 months and after that I recommended a CT angiogram of the chest abdomen pelvis to rule out a potential source of embolization.  If this is unremarkable that I think we could consider stopping her anticoagulation and even considering a hypercoagulable work-up when she is off anticoagulation.  Apparently the CT angiogram was denied by her insurance company.  She did have ABIs done today.  Since I saw her last she denies any significant claudication or rest pain.  Unfortunately she does continue to smoke some.  She remains on her Xarelto.  Past Medical History:  Diagnosis Date  . Arthritis   . Cancer ([place])    breast  . Diabetes mellitus without complication ([place])     History reviewed. No pertinent family history.  SOCIAL HISTORY: Social History   Tobacco Use  . Smoking status: Current Every Day Smoker    Packs/day: 0.50    Years: 27.00    Pack years: 13.50  . Smokeless tobacco: Never Used  . Tobacco comment: patient stated she has the patch and materials  Substance Use Topics  . Alcohol use: Yes    Comment: "During the holidays"     No Known Allergies  Current Outpatient  Medications  Medication Sig Dispense Refill  . Cyanocobalamin (VITAMIN B 12 PO) Take 1 tablet by mouth daily.    Marland Kitchen lisinopril (PRINIVIL,ZESTRIL) 2.5 MG tablet Take 2.5 mg by mouth daily.  11  . metFORMIN (GLUCOPHAGE) 500 MG tablet Take 2 tablets (1,000 mg total) by mouth 2 (two) times daily. 60 tablet 0  . Multiple Vitamin (MULTIVITAMIN WITH MINERALS) TABS tablet Take 1 tablet by mouth daily.    . rivaroxaban (XARELTO) 20 MG TABS tablet Take 1 tablet (20 mg total) by mouth daily with supper. 30 tablet 5  . Vitamins/Minerals TABS Take by mouth.     No current facility-administered medications for this visit.     REVIEW OF SYSTEMS:  [X]  denotes positive finding, [ ]  denotes negative finding Cardiac  Comments:  Chest pain or chest pressure:    Shortness of breath upon exertion:    Short of breath when lying flat:    Irregular heart rhythm:        Vascular    Pain in calf, thigh, or hip brought on by ambulation:    Pain in feet at night that wakes you up from your sleep:     Blood clot in your veins:    Leg swelling:         Pulmonary    Oxygen at home:    Productive cough:     Wheezing:         Neurologic    Sudden weakness  in arms or legs:     Sudden numbness in arms or legs:     Sudden onset of difficulty speaking or slurred speech:    Temporary loss of vision in one eye:     Problems with dizziness:         Gastrointestinal    Blood in stool:     Vomited blood:         Genitourinary    Burning when urinating:     Blood in urine:        Psychiatric    Major depression:         Hematologic    Bleeding problems:    Problems with blood clotting too easily:        Skin    Rashes or ulcers:        Constitutional    Fever or chills:     PHYSICAL EXAM:   Vitals:   10/21/18 1358  BP: 111/78  Pulse: 82  Resp: 10  Temp: 97.6 F (36.4 C)  TempSrc: Temporal  SpO2: 98%  Weight: 204 lb (92.5 kg)  Height: 5' 1"  (1.549 m)    GENERAL: The patient is a  well-nourished female, in no acute distress. The vital signs are documented above. CARDIAC: There is a regular rate and rhythm.  VASCULAR: I do not detect carotid bruits. She has brisk Doppler signals in both feet.  Both feet are warm and well-perfused. Her left groin incision is well-healed. PULMONARY: There is good air exchange bilaterally without wheezing or rales. ABDOMEN: Soft and non-tender with normal pitched bowel sounds.  MUSCULOSKELETAL: There are no major deformities or cyanosis. NEUROLOGIC: No focal weakness or paresthesias are detected. SKIN: There are no ulcers or rashes noted. PSYCHIATRIC: The patient has a normal affect.  DATA:    ARTERIAL DOPPLER STUDY: I have independently interpreted her arterial Doppler study today.  On the right side there is a triphasic dorsalis pedis and posterior tibial signal.  ABI is 100%.  Toe pressure is 102 mmHg.  On the left side there is a monophasic anterior tibial signal.  There is a triphasic posterior tibial signal.  ABI is 100%.  Toe pressure is 52 mmHg.  MEDICAL ISSUES:   LEFT ILIOFEMORAL EMBOLUS: This patient presented with an acutely ischemic left lower extremity and was was found to have an embolic event to her iliofemoral system on the left.  Her echo was unremarkable.  CT of the abdomen and pelvis was only done during the acute phase when her iliofemoral system was occluded with thrombus.  She has been maintained on Xarelto now for 6 months.  My plan was to obtain a CT of the chest abdomen pelvis to rule out a potential source for embolization and if this looks good I think we could consider stopping her Xarelto and then referring her for hematologic evaluation while she is off Xarelto.  Insurance company has not yet approved her CT chest abdomen and pelvis.  Given that I cannot currently rule out an embolic source I will need to maintain her on Xarelto for now.  This does put her at slightly high risk for bleeding complications.  If  her CT of the chest abdomen and pelvis is improved and I think we can proceed with this and determine if we can safely stop her Xarelto.  When she is off Xarelto she could undergone work-up for hypercoagulable condition by hematology.  If this is negative then I would feel safe leaving her off  of anticoagulation.  Deitra Mayo [facility] [phone]

## 2018-10-22 ENCOUNTER — Encounter: Payer: Self-pay | Admitting: Family

## 2018-10-26 ENCOUNTER — Ambulatory Visit
Admission: RE | Admit: 2018-10-26 | Discharge: 2018-10-26 | Disposition: A | Discharge status: Home / Self Care | Payer: Medicaid Other | Source: Ambulatory Visit | Attending: Vascular Surgery | Admitting: Vascular Surgery

## 2018-10-26 ENCOUNTER — Inpatient Hospital Stay: Admission: RE | Admit: 2018-10-26 | Payer: Medicaid Other | Source: Ambulatory Visit

## 2018-10-26 DIAGNOSIS — I745 Embolism and thrombosis of iliac artery: Secondary | ICD-10-CM

## 2018-10-26 MED ORDER — IOPAMIDOL (ISOVUE-370) INJECTION 76%
75.0000 mL | Freq: Once | INTRAVENOUS | Status: AC | PRN
  Administered 2018-10-26: 75 mL via INTRAVENOUS

## 2018-11-24 DIAGNOSIS — R002 Palpitations: Secondary | ICD-10-CM | POA: Insufficient documentation

## 2019-06-19 ENCOUNTER — Encounter (HOSPITAL_COMMUNITY): Payer: Self-pay | Admitting: Emergency Medicine

## 2019-06-19 ENCOUNTER — Emergency Department (HOSPITAL_BASED_OUTPATIENT_CLINIC_OR_DEPARTMENT_OTHER): Payer: Medicaid Other

## 2019-06-19 ENCOUNTER — Emergency Department (HOSPITAL_COMMUNITY)
Admission: EM | Admit: 2019-06-19 | Discharge: 2019-06-19 | Disposition: A | Discharge status: Home / Self Care | Payer: Medicaid Other | Attending: Emergency Medicine | Admitting: Emergency Medicine

## 2019-06-19 ENCOUNTER — Other Ambulatory Visit: Payer: Self-pay

## 2019-06-19 DIAGNOSIS — E119 Type 2 diabetes mellitus without complications: Secondary | ICD-10-CM | POA: Diagnosis not present

## 2019-06-19 DIAGNOSIS — R609 Edema, unspecified: Secondary | ICD-10-CM | POA: Diagnosis not present

## 2019-06-19 DIAGNOSIS — Z79899 Other long term (current) drug therapy: Secondary | ICD-10-CM | POA: Diagnosis not present

## 2019-06-19 DIAGNOSIS — M25552 Pain in left hip: Secondary | ICD-10-CM | POA: Diagnosis not present

## 2019-06-19 DIAGNOSIS — M79605 Pain in left leg: Secondary | ICD-10-CM

## 2019-06-19 DIAGNOSIS — Z7984 Long term (current) use of oral hypoglycemic drugs: Secondary | ICD-10-CM | POA: Diagnosis not present

## 2019-06-19 DIAGNOSIS — R2242 Localized swelling, mass and lump, left lower limb: Secondary | ICD-10-CM | POA: Insufficient documentation

## 2019-06-19 DIAGNOSIS — F17200 Nicotine dependence, unspecified, uncomplicated: Secondary | ICD-10-CM | POA: Insufficient documentation

## 2019-06-19 DIAGNOSIS — Z853 Personal history of malignant neoplasm of breast: Secondary | ICD-10-CM | POA: Insufficient documentation

## 2019-06-19 LAB — BASIC METABOLIC PANEL
Anion gap: 11 (ref 5–15)
BUN: 19 mg/dL (ref 6–20)
CO2: 25 mmol/L (ref 22–32)
Calcium: 9.6 mg/dL (ref 8.9–10.3)
Chloride: 101 mmol/L (ref 98–111)
Creatinine, Ser: 0.6 mg/dL (ref 0.44–1.00)
GFR calc Af Amer: >60 mL/min (ref >60)
GFR calc non Af Amer: >60 mL/min (ref >60)
Glucose, Bld: 234 mg/dL — ABNORMAL HIGH (ref 70–99)
Potassium: 3.5 mmol/L (ref 3.5–5.1)
Sodium: 137 mmol/L (ref 135–145)

## 2019-06-19 LAB — CBC WITH DIFFERENTIAL/PLATELET
Abs Immature Granulocytes: 0.03 10*3/uL (ref 0.00–0.07)
Basophils Absolute: 0 10*3/uL (ref 0.0–0.1)
Basophils Relative: 0 %
Eosinophils Absolute: 0.1 10*3/uL (ref 0.0–0.5)
Eosinophils Relative: 1 %
HCT: 38 % (ref 36.0–46.0)
Hemoglobin: 12.6 g/dL (ref 12.0–15.0)
Immature Granulocytes: 0 %
Lymphocytes Relative: 33 %
Lymphs Abs: 3.6 10*3/uL (ref 0.7–4.0)
MCH: 33.9 pg (ref 26.0–34.0)
MCHC: 33.2 g/dL (ref 30.0–36.0)
MCV: 102.2 fL — ABNORMAL HIGH (ref 80.0–100.0)
Monocytes Absolute: 0.6 10*3/uL (ref 0.1–1.0)
Monocytes Relative: 5 %
Neutro Abs: 6.8 10*3/uL (ref 1.7–7.7)
Neutrophils Relative %: 61 %
Platelets: 288 10*3/uL (ref 150–400)
RBC: 3.72 MIL/uL — ABNORMAL LOW (ref 3.87–5.11)
RDW: 12 % (ref 11.5–15.5)
WBC: 11.2 10*3/uL — ABNORMAL HIGH (ref 4.0–10.5)
nRBC: 0 % (ref 0.0–0.2)

## 2019-06-19 NOTE — Discharge Instructions (Signed)
Your DVT study was negative today.  Your laboratory work was within normal limits.   You may continue to turn a Tylenol or ibuprofen for pain.  Please follow-up with your primary care physician as needed.

## 2019-06-19 NOTE — Progress Notes (Signed)
Left lower extremity venous duplex completed. Refer to "CV Proc" under chart review to view preliminary results.  06/19/2019 3:43 PM Kelby Aline., MHA, RVT, RDCS, RDMS

## 2019-06-19 NOTE — ED Provider Notes (Signed)
Megan Riddle DEPT Provider Note   CSN: [id number] Arrival date & time: 06/19/19  1349     History Chief Complaint  Patient presents with  . Leg Pain  . Foot Swelling    Megan Riddle is a 44 y.o. 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  For the past 3 days has noticed intermittent left lower extremity edema from her foot to her knee.  She thought this was due to frequent standing and walking at work.  States that the swelling has gradually improved with elevation.  She states that this does not feel similar to the time that she had her occlusion in this extremity.  She took Xarelto for 6 months after her thrombectomy but has since discontinued it.  She denies any chest pain, shortness of breath, injuries or falls, fever or history of DVT.  HPI     Past Medical History:  Diagnosis Date  . Arthritis   . Cancer ([place])    breast  . Diabetes mellitus without complication [facility])     Patient Active Problem List   Diagnosis Date Noted  . Acute serous otitis media of left ear 09/11/2018  . Occlusion of left iliac artery ([place]) 03/23/2018  . Sensation of cold in leg 03/22/2018  . Class 2 severe obesity due to excess calories with serious comorbidity and body mass index (BMI) of 39.0 to 39.9 in adult ([place]) 10/27/2017  . 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 edema, without long-term current use of insulin ([place]) 10/27/2017  . Hyperlipidemia associated with type 2 diabetes mellitus ([place]) 08/22/2016  . Tobacco user 02/20/2016  . Panic attack as reaction to stress 02/13/2016  . OSA (obstructive sleep apnea) 01/14/2014  . Nicotine dependence 01/14/2014  . PTSD (post-traumatic stress disorder) 01/14/2014    Past Surgical History:  Procedure Laterality Date   . FEMORAL-FEMORAL BYPASS GRAFT Left 03/22/2018   Procedure: LEFT ILIAC ARTERTY EMBOLECTOMY;  Surgeon: Angelia Mould, MD;  Location: [place];  Service: Vascular;  Laterality: Left;  . INTRAOPERATIVE ARTERIOGRAM Left 03/22/2018   Procedure: Intra Operative Arteriogram LEFT LOWER LEG;  Surgeon: Angelia Mould, MD;  Location: [place];  Service: Vascular;  Laterality: Left;  . PATCH ANGIOPLASTY Left 03/22/2018   Procedure: PATCH ANGIOPLASTY OF LEFT FEMORAL ARTERY;  Surgeon: Angelia Mould, MD;  Location: [facility] OR;  Service: Vascular;  Laterality: Left;     OB History   No obstetric history on file.     No family history on file.  Social History   Tobacco Use  . Smoking status: Current Every Day Smoker    Packs/day: 0.50    Years: 27.00    Pack years: 13.50  . Smokeless tobacco: Never Used  . Tobacco comment: patient stated she has the patch and materials  Substance Use Topics  . Alcohol use: Yes    Comment: "During the holidays"   . Drug use: Yes    Types: Marijuana    Comment: Everyother day     Home Medications Prior to Admission medications   Medication Sig Start Date End Date Taking? Authorizing Provider  Cyanocobalamin (VITAMIN B 12 PO) Take 1 tablet by mouth daily.   Yes [provider]  fexofenadine (ALLEGRA) 180 MG tablet Take 180 mg by mouth daily.   Yes [provider]  lisinopril (PRINIVIL,ZESTRIL) 2.5 MG tablet Take 2.5 mg by mouth daily as needed (high blood pressure).  12/04/17  Yes [provider]  Multiple Vitamin (MULTIVITAMIN WITH MINERALS) TABS tablet Take 1 tablet by mouth daily.   Yes [provider]  TURMERIC PO Take 1 tablet by mouth daily.   Yes [provider]  Vitamins/Minerals TABS Take by mouth.   Yes [provider]  metFORMIN (GLUCOPHAGE) 500 MG tablet Take 2 tablets (1,000 mg total) by mouth 2 (two) times daily. Patient taking differently: Take 1,000 mg by mouth 2 (two) times daily  as needed (low blood sugar).  12/19/15   Megan Breach, PA-C  rivaroxaban (XARELTO) 20 MG TABS tablet Take 1 tablet (20 mg total) by mouth daily with supper. Patient not taking: Reported on 06/19/2019 04/22/18   Angelia Mould, MD    Allergies    Patient has no known allergies.  Review of Systems   Review of Systems  Constitutional: Negative for appetite change, chills and fever.  HENT: Negative for ear pain, rhinorrhea, sneezing and sore throat.   Eyes: Negative for photophobia and visual disturbance.  Respiratory: Negative for cough, chest tightness, shortness of breath and wheezing.   Cardiovascular: Positive for leg swelling. Negative for chest pain and palpitations.  Gastrointestinal: Negative for abdominal pain, blood in stool, constipation, diarrhea, nausea and vomiting.  Genitourinary: Negative for dysuria, hematuria and urgency.  Musculoskeletal: Negative for myalgias.  Skin: Negative for rash.  Neurological: Negative for dizziness, weakness and light-headedness.    Physical Exam Updated Vital Signs BP 113/80   Pulse 90   Temp 98.1 F (36.7 C)   Resp 18   LMP 12/20/2017 (Approximate) Comment: " I don't even remember".   SpO2 97%   Physical Exam Vitals and nursing note reviewed.  Constitutional:      General: She is not in acute distress.    Appearance: She is well-developed.  HENT:     Head: Normocephalic and atraumatic.     Nose: Nose normal.  Eyes:     General: No scleral icterus.       Left eye: No discharge.     Conjunctiva/sclera: Conjunctivae normal.  Cardiovascular:     Rate and Rhythm: Normal rate and regular rhythm.     Pulses:          Dorsalis pedis pulses are 2+ on the right side and detected w/ Doppler on the left side.     Heart sounds: Normal heart sounds. No murmur. No friction rub. No gallop.   Pulmonary:     Effort: Pulmonary effort is normal. No respiratory distress.     Breath sounds: Normal breath sounds.  Abdominal:     General:  Bowel sounds are normal. There is no distension.     Palpations: Abdomen is soft.     Tenderness: There is no abdominal tenderness. There is no guarding.  Musculoskeletal:        General: Normal range of motion.     Cervical back: Normal range of motion and neck supple.     Comments: No significant edema of the left lower extremity.  No calf tenderness, erythema or temperature change noted.   Skin:    General: Skin is warm and dry.     Findings: No rash.  Neurological:     Mental Status: She is alert.     Motor: No abnormal muscle tone.     Coordination: Coordination normal.     ED Results / Procedures /  Treatments   Labs (all labs ordered are listed, but only abnormal results are displayed) Labs Reviewed  BASIC METABOLIC PANEL  CBC WITH DIFFERENTIAL/PLATELET    EKG None  Radiology No results found.  Procedures Procedures (including critical care time)  Medications Ordered in ED Medications - No data to display  ED Course  I have reviewed the triage vital signs and the nursing notes.  Pertinent labs & imaging results that were available during my care of the patient were reviewed by me and considered in my medical decision making (see chart for details).  Clinical Course as of Jun 18 1505  Sat Jun 19, 2019  1422 She is complaining of left hip and left foot pain started 3 days ago.  Was associated with some swelling.  Remote history of arterial clot that required embolectomy and stenting.  Not on any anticoagulation.  No numbness.  No trauma.   [MB]  39 Spoke to vascular tech who will order ABIs if she sees abnormal arterial flow on vascular u/s for DVT.   [HK]    Clinical Course User Index [HK] Delia Heady, PA-C [MB] Hayden Rasmussen, MD   MDM Rules/Calculators/A&P                      [id number] F with a past medical history of L internal iliac artery occlusion s/p thrombectomy in February 2020 presenting to the ED with a chief complaint of left leg swelling and  pain for the past 3 days. She believes its from standing up and walking around during work. Symptoms improved greatly with elevation yesterday. Denies any chest pain, shortness of breath. She is no longer on anticoagulation. She does not have significant edema noted on exam without calf tenderness, changes to sensation or temperature. DP pulse on L side is detected with a doppler at bedside. She remains ambulatory here. Her vital signs are within normal limits. Will need to obtain DVT study +/- ABI. If negative, can f/u with PCP and continue elevation. Care handed off to oncoming provider pending remainder of workup. Patient discussed with and seen by the attending, Dr. Melina Copa.   Portions of this note were generated with [employer]. Dictation errors may occur despite best attempts at proofreading.   Final Clinical Impression(s) / ED Diagnoses Final diagnoses:  Left leg pain    Rx / DC Orders ED Discharge Orders    None       Delia Heady, PA-C 06/19/19 1507    Hayden Rasmussen, MD 06/19/19 1758

## 2019-06-19 NOTE — ED Triage Notes (Signed)
Pt c/o left hip and leg pains and swelling in left foot x 3 days. Had blood clots last year. Not currently on blood thinners. UC care sent pt to rule out blood clots.

## 2019-06-19 NOTE — ED Provider Notes (Signed)
  Physical Exam  BP 113/80   Pulse 90   Temp 98.1 F (36.7 C)   Resp 18   LMP 12/20/2017 (Approximate) Comment: " I don't even remember".   SpO2 97%   Physical Exam  ED Course/Procedures   Clinical Course as of Jun 18 1609  Sat Jun 19, 2019  1422 She is complaining of left hip and left foot pain started 3 days ago.  Was associated with some swelling.  Remote history of arterial clot that required embolectomy and stenting.  Not on any anticoagulation.  No numbness.  No trauma.   [MB]  81 Spoke to vascular tech who will order ABIs if she sees abnormal arterial flow on vascular u/s for DVT.   [HK]    Clinical Course User Index [HK] Delia Heady, PA-C [MB] Hayden Rasmussen, MD    Procedures  MDM  Patient care assumed from [facility]. PA at shift change please see her note for a full HPI. Briefly, patient here with left leg swelling x 3 days ago.  Came in with a similar feeling that she had when she was priorly diagnosed with a left internal iliac artery occlusion.  Plan is for pending DVT study to rule out any illusion.  Vascular technologist has also agreed to perform arterial study in order to see if any is needed.  Labs are also currently pending.  Plan is for disposition home if all appears reassuring.  3:38 PM Negative for DVT. Arteries look good.   BMP without any electrolyte abnormality, glucose is slightly elevated the patient is a previous history of diabetes.  Current levels within normal limits.  CBC with a mild leukocytosis.  Discussed results of DVT studies with patient, patient is agreeable to continue symptomatic treatment at home with naproxen or Advil.  She understands agrees with management, patient discharged in stable condition.  Portions of this note were generated with [employer]. Dictation errors may occur despite best attempts at proofreading.       Janeece Fitting, PA-C 06/19/19 1611    Drenda Freeze, MD 06/19/19 [phone]

## 2020-01-30 LAB — COLOGUARD: COLOGUARD: NEGATIVE

## 2020-09-23 IMAGING — CT CT ANGIO AOBIFEM WO/W CM
1 of 9 series · 4 of 16 positions shown, 5 images · IV contrast (iopamidol)
Comparison: None.

CLINICAL DATA: Left lower leg pain from the knee down. Decreased
pulses in the left leg. Cold left leg. History of diabetes, breast
cancer, arthritis.

EXAM:
CT ANGIOGRAPHY OF ABDOMINAL AORTA WITH ILIOFEMORAL RUNOFF
TECHNIQUE: Multidetector CT imaging of the abdomen, pelvis and lower
extremities was performed using the standard protocol during bolus
administration of intravenous contrast. Multiplanar CT image
reconstructions and MIPs were obtained to evaluate the vascular
anatomy.
CONTRAST:  100mL [id number]-[id number] IOPAMIDOL ([id number]) INJECTION 76%

[Series 5: arterial · axial · arterial · 0.83mm/px · z∈[+30,+786]mm · 4 of 632 slices shown, 5 images]
[im 127/632  soft-tissue]
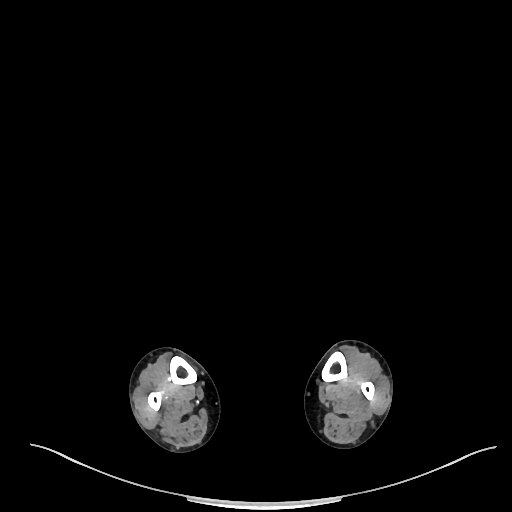
[im 127/632  bone]
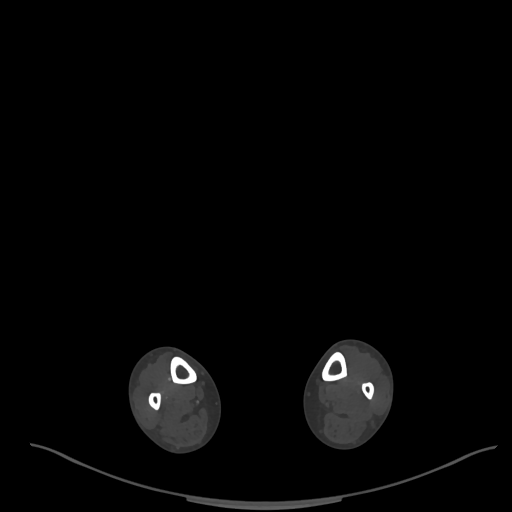
[im 253/632  soft-tissue]
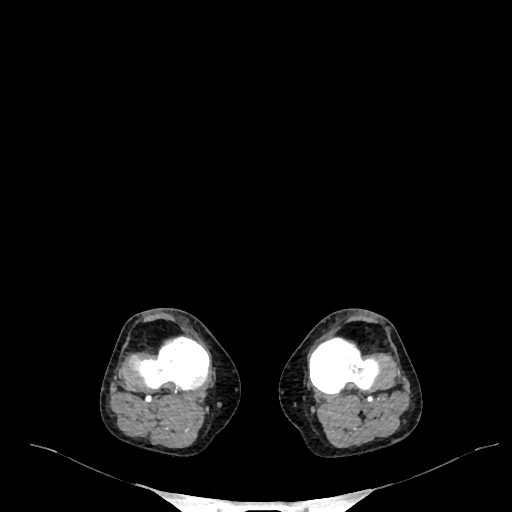
[im 379/632  soft-tissue]
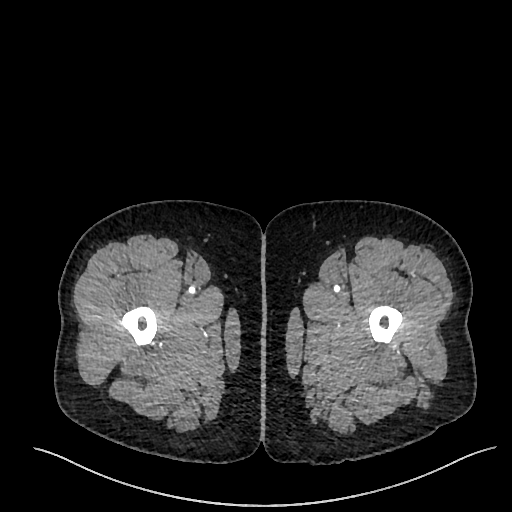
[im 505/632  soft-tissue]
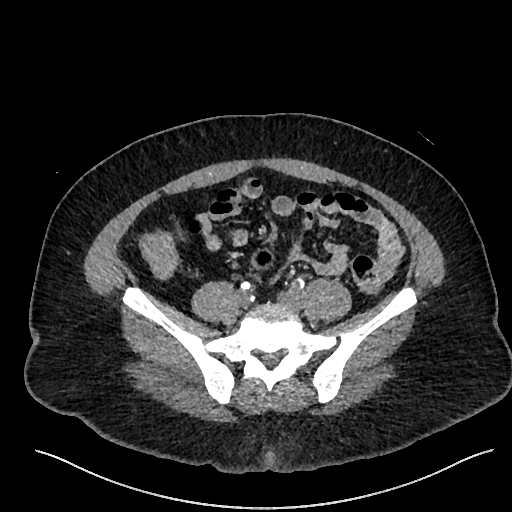

[4 of 16 positions shown; findings below may reference images not displayed]

FINDINGS: VASCULAR

Aorta: Normal caliber aorta without aneurysm, dissection, vasculitis
or significant stenosis.

Celiac: Patent without evidence of aneurysm, dissection, vasculitis
or significant stenosis.

SMA: Patent without evidence of aneurysm, dissection, vasculitis or
significant stenosis.

Renals: Both renal arteries are patent without evidence of aneurysm,
dissection, vasculitis, fibromuscular dysplasia or significant
stenosis.

IMA: Patent without evidence of aneurysm, dissection, vasculitis or
significant stenosis.

RIGHT Lower Extremity

Inflow: Mild calcification in the common iliac artery and at the
origin of the internal iliac artery. Common, internal, and external
iliac arteries remain patent. No aneurysm or dissection.

Outflow: Common, superficial and profunda femoral arteries and the
popliteal artery are patent without evidence of aneurysm,
dissection, vasculitis or significant stenosis.

Runoff: Patent three vessel runoff to the ankle.

LEFT Lower Extremity

Inflow: There is an about 4.5 cm segment of acute appearing thrombus
demonstrated in the common iliac artery with focal complete or near
complete occlusion at the level of the bifurcation. Thrombus extends
to the bifurcation with occlusion of the internal iliac artery.
Reconstitution of the external iliac artery proximally. Distal
reconstitution of the internal iliac artery.

Outflow: Common, superficial and profunda femoral arteries and the
popliteal artery are patent without evidence of aneurysm,
dissection, vasculitis or significant stenosis.

Runoff: Patent three vessel runoff to the ankle.

Veins: No obvious venous abnormality within the limitations of this
arterial phase study.

Review of the MIP images confirms the above findings.

NON-VASCULAR

Lower chest: Probable motion artifact and dependent changes in the
lung bases. No obvious consolidation.

Hepatobiliary: No focal liver abnormality is seen. No gallstones,
gallbladder wall thickening, or biliary dilatation.

Pancreas: Unremarkable. No pancreatic ductal dilatation or
surrounding inflammatory changes.

Spleen: Normal in size without focal abnormality.

Adrenals/Urinary Tract: Adrenal glands are unremarkable. Kidneys are
normal, without renal calculi, focal lesion, or hydronephrosis.
Bladder is unremarkable.

Stomach/Bowel: Stomach is within normal limits. Appendix appears
normal. No evidence of bowel wall thickening, distention, or
inflammatory changes.

Lymphatic: No significant lymphadenopathy.

Reproductive: Uterus appears somewhat enlarged, possibly fibroid. No
abnormal adnexal masses.

Other: No abdominal wall hernia or abnormality. No abdominopelvic
ascites.

Musculoskeletal: No acute or significant osseous findings.
IMPRESSION: VASCULAR

There is an about 4.5 cm segment of acute thrombus in the left
common iliac artery with focal complete or near complete occlusion
at the level of the bifurcation. Thrombus extends into the
bifurcation with occlusion of the internal iliac artery.
Reconstitution of the external iliac artery proximally. Distal
reconstitution of the internal iliac artery proximally.

NON-VASCULAR

No acute process demonstrated in the abdomen or pelvis.

These results were called by telephone at the time of interpretation
on 03/22/2018 at [DATE] to PA. PRISCA NIMMAGADDA , who verbally
acknowledged these results.

## 2020-09-26 IMAGING — CR DG FOOT COMPLETE 3+V*L*
3 series · 3 of 3 positions shown · non-contrast
Comparison: None.

CLINICAL DATA: Redness and swelling of the proximal left foot.
Recent vascular surgery.

EXAM:
LEFT FOOT - COMPLETE 3+ VIEW

[foot ap]
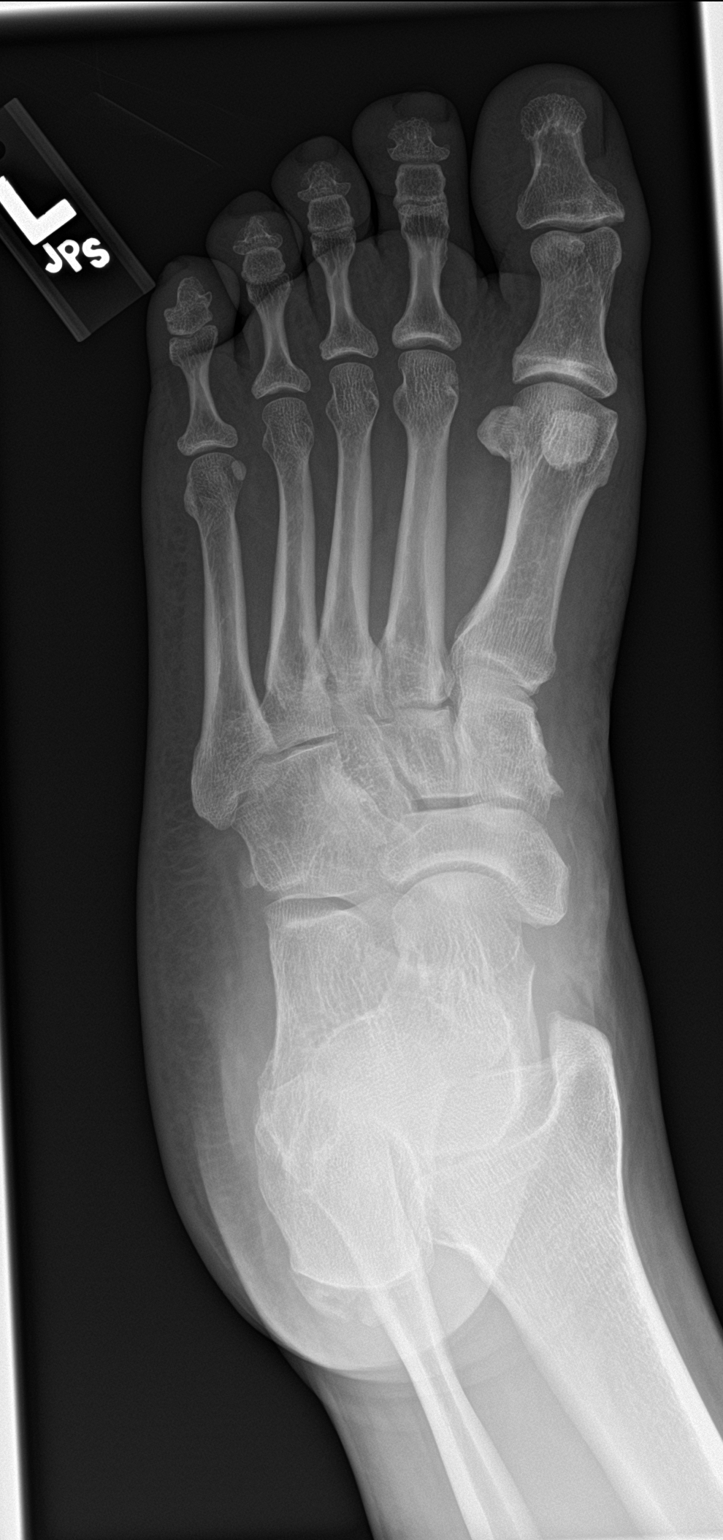

[foot obl]
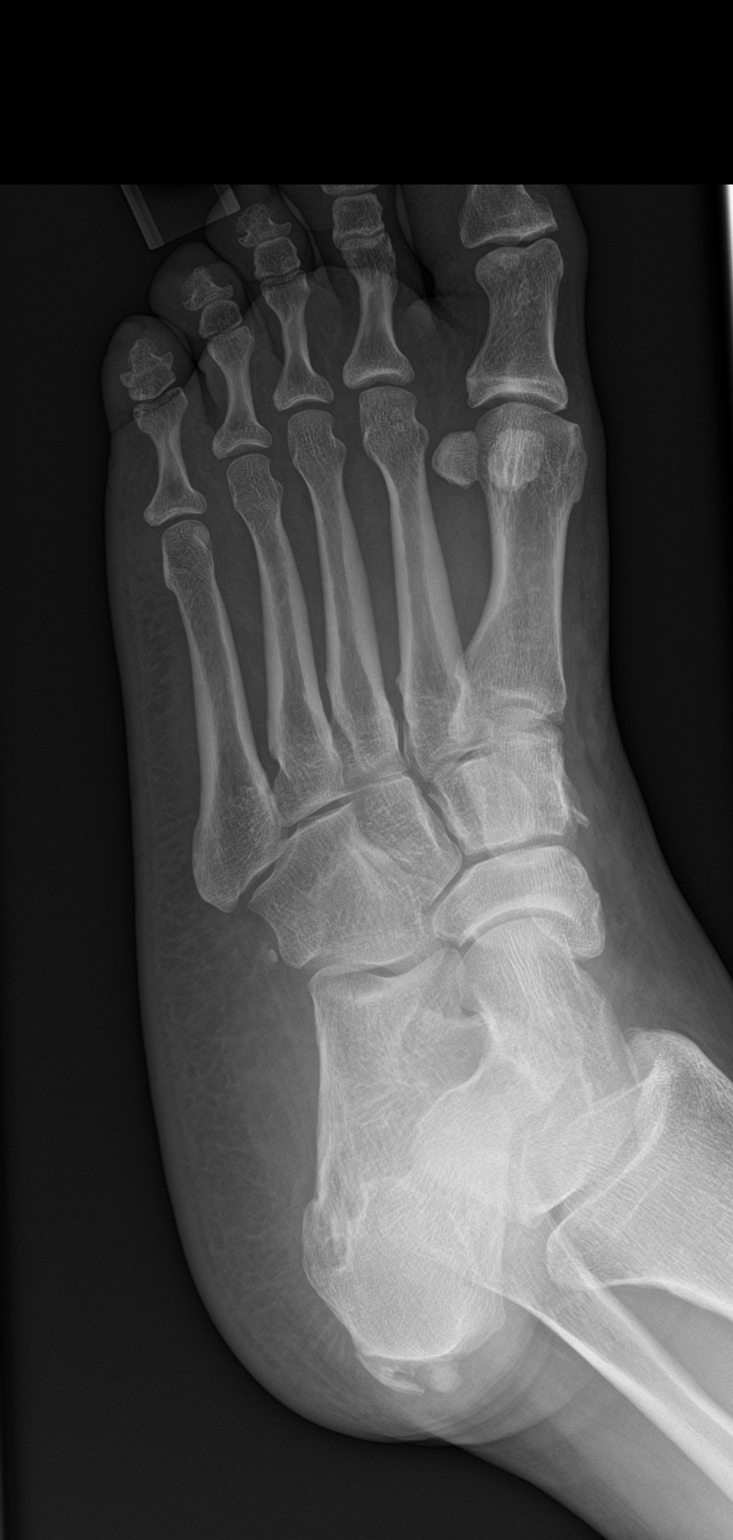

[foot lat]
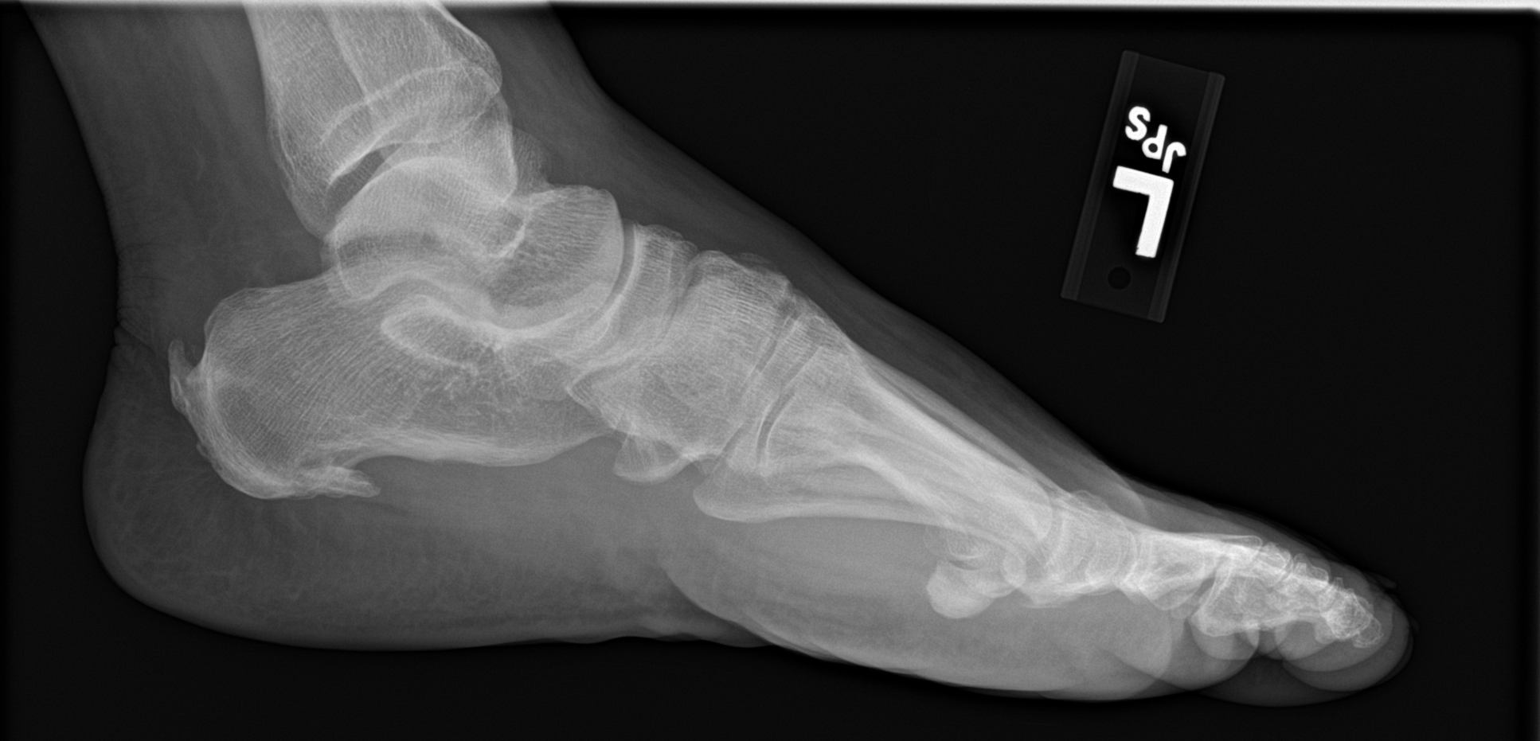

[3 of 3 positions shown; findings below may reference images not displayed]

FINDINGS: There is no evidence of fracture or dislocation. There is no
evidence of arthropathy or other focal bone abnormality. Mild
midfoot swelling.
IMPRESSION: No acute osseous abnormalities of the left foot.

## 2020-10-16 ENCOUNTER — Encounter (HOSPITAL_COMMUNITY): Payer: Self-pay

## 2020-10-16 ENCOUNTER — Other Ambulatory Visit: Payer: Self-pay

## 2020-10-16 ENCOUNTER — Ambulatory Visit (HOSPITAL_COMMUNITY)
Admission: EM | Admit: 2020-10-16 | Discharge: 2020-10-16 | Disposition: A | Discharge status: Home / Self Care | Payer: Medicaid Other | Attending: Sports Medicine | Admitting: Sports Medicine

## 2020-10-16 DIAGNOSIS — K5909 Other constipation: Secondary | ICD-10-CM | POA: Diagnosis not present

## 2020-10-16 MED ORDER — SENNOSIDES-DOCUSATE SODIUM 8.6-50 MG PO TABS: 1.0000 | ORAL_TABLET | Freq: Two times a day (BID) | ORAL | 0 refills | Status: DC

## 2020-10-16 MED ORDER — POLYETHYLENE GLYCOL 3350 17 G PO PACK: 17.0000 g | PACK | Freq: Two times a day (BID) | ORAL | 0 refills | Status: DC

## 2020-10-16 NOTE — Discharge Instructions (Signed)
Avoid lactose-containing foods Increase water intake, daily fiber intake  Begin MiraLAX, 1 capful twice daily.  You may increase 1 capful for every day do not have a soft bowel movement.  Once her bowel movements normalize, take this once daily for an additional 10 days.  Begin Senokot --> may take once or twice daily until bowel movements begin  F/u with your PCP

## 2020-10-16 NOTE — ED Triage Notes (Signed)
Pt reports lower back pain x 2 days. Pian worse when lifting and bending. "I cant poop, belch or pass gas" . Denies abdominal pain, dysuria.

## 2020-10-16 NOTE — ED Provider Notes (Signed)
[place]    CSN: [id number] Arrival date & time: 10/16/20  1316      History   Chief Complaint Chief Complaint  Patient presents with   Back Pain    HPI Megan Riddle is a 45 y.o. female here for evaluation of bloating, constipation, and belly/back pain.  HPI  Had an X-ray of lumbar spine 09/03/2018 which demonstrated left-sided facet degneration at L5-S1.  Today, she states she hasn't had a BM in 3 days. Hx of lactose intolerance. Usually take beni-fiber but she has ran out of that. Has not had much flatulence. Still eating ok. No dysuria, increased frequency, or burning with urination. She reports bloating and some cramping in the lower abdomen. This has happened previously as she has taken medication to help her bowels move. She is scheduled to follow-up with her PCP as well. She denies any blood in stool. Last BM was 3 days ago which was soft but nothing since that time.   No chance she could be pregnant, as she was married to a female. No female sexual activity reported.   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;  Service: Vascular;  Laterality: Left;   INTRAOPERATIVE ARTERIOGRAM Left 03/22/2018   Procedure: Intra Operative Arteriogram LEFT LOWER LEG;  Surgeon: Angelia Mould, MD;  Location: [place];  Service: Vascular;  Laterality: Left;   PATCH ANGIOPLASTY Left 03/22/2018   Procedure: PATCH ANGIOPLASTY OF LEFT FEMORAL ARTERY;  Surgeon: Angelia Mould, MD;  Location: [place];  Service: Vascular;  Laterality: Left;    OB History   No obstetric history on file.      Home Medications    Prior to Admission medications   Medication Sig Start Date End Date Taking? Authorizing Provider  polyethylene glycol (MIRALAX) 17 g packet Take 17 g by mouth 2 (two) times daily. 10/16/20  Yes Elba Barman, DO  senna-docusate (SENOKOT-S) 8.6-50 MG tablet Take 1 tablet by mouth 2 (two) times daily. 10/16/20  Yes Elba Barman, DO  Cyanocobalamin (VITAMIN B 12 PO) Take 1 tablet by mouth daily.    [provider]  fexofenadine (ALLEGRA) 180 MG tablet Take 180 mg by mouth daily.    [provider]  lisinopril (PRINIVIL,ZESTRIL) 2.5 MG tablet Take 2.5 mg by mouth daily as needed (high blood pressure).  12/04/17   [provider]  metFORMIN (GLUCOPHAGE) 500 MG tablet Take  2 tablets (1,000 mg total) by mouth 2 (two) times daily. Patient taking differently: Take 1,000 mg by mouth 2 (two) times daily as needed (low blood sugar).  12/19/15   Antonietta Breach, PA-C  Multiple Vitamin (MULTIVITAMIN WITH MINERALS) TABS tablet Take 1 tablet by mouth daily.    [provider]  rivaroxaban (XARELTO) 20 MG TABS tablet Take 1 tablet (20 mg total) by mouth daily with supper. Patient not taking: Reported on 06/19/2019 04/22/18   Angelia Mould, MD  TURMERIC PO Take 1 tablet by  mouth daily.    [provider]  Vitamins/Minerals TABS Take by mouth.    [provider]    Family History History reviewed. No pertinent family history.  Social History Social History   Tobacco Use   Smoking status: Every Day    Packs/day: 0.50    Years: 27.00    Pack years: 13.50    Types: Cigarettes   Smokeless tobacco: Never   Tobacco comments:    patient stated she has the patch and materials  Vaping Use   Vaping Use: Never used  Substance Use Topics   Alcohol use: Yes    Comment: "During the holidays"    Drug use: Yes    Types: Marijuana    Comment: Everyother day      Allergies   Patient has no known allergies.   Review of Systems Review of Systems  Constitutional:  Positive for appetite change. Negative for chills and fever.  Gastrointestinal:  Positive for abdominal pain and constipation. Negative for blood in stool and vomiting.  Genitourinary:  Negative for difficulty urinating, flank pain and frequency.  Musculoskeletal:  Positive for back pain.  Neurological:  Negative for weakness.    Physical Exam Triage Vital Signs ED Triage Vitals  Enc Vitals Group     BP      Pulse      Resp      Temp      Temp src      SpO2      Weight      Height      Head Circumference      Peak Flow      Pain Score      Pain Loc      Pain Edu?      Excl. in [place]?    No data found.  Updated Vital Signs BP (!) 125/58 (BP Location: Left Arm)   Pulse 79   Temp 97.8 F (36.6 C) (Oral)   Resp 18   LMP 12/20/2017 (Approximate) Comment: " I don't even remember".   SpO2 98%   Visual Acuity Right Eye Distance:   Left Eye Distance:   Bilateral Distance:    Right Eye Near:   Left Eye Near:    Bilateral Near:     Physical Exam Gen: Well-appearing, in no acute distress; non-toxic CV: Regular Rate. Well-perfused. Warm.  Resp: Breathing unlabored on room air; no wheezing. Abd: soft, slightly distended abdomen throughout; mildly TTP in b/l  lower quadrants with deep palpation; BS present x 4 quadrants; no rebound or guarding Psych: Fluid speech in conversation; appropriate affect; normal thought process      UC Treatments / Results  Labs (all labs ordered are listed, but only abnormal results are displayed) Labs Reviewed - No data to display  EKG   Radiology No results found.  Procedures Procedures (including critical care time)  Medications Ordered in UC Medications - No data to display  Initial Impression / Assessment and Plan / UC Course  I have reviewed the triage vital signs and the nursing notes.  Pertinent labs & imaging results that were available during my care of the patient were reviewed by me and considered in my medical decision making (see chart for details).     Constipation - in setting of lactose intolerance and dietary changes. No BM x 3 days. Abdomen distended but soft and no rebound or guarding. Bowel sounds are normoactive on exam, so I do not have concern for ileus at this time. Discussed dietary management.  She is also to increase water intake and daily fiber intake.  We will begin MiraLAX starting with 1 capful twice daily, she may increase by 1 capful for every day she does not have a bowel movement.  We will also add Senokot to be taken once or twice daily until bowel movements begin.  We discussed continuing the MiraLAX following the resolution of her constipation to prevent this from reoccurring.  She is to follow-up with her PCP for this as well.  Return precautions were provided.  Patient safe for discharge home. Final Clinical Impressions(s) / UC Diagnoses   Final diagnoses:  Other constipation     Discharge Instructions      Avoid lactose-containing foods Increase water intake, daily fiber intake  Begin MiraLAX, 1 capful twice daily.  You may increase 1 capful for every day do not have a soft bowel movement.  Once her bowel movements normalize, take this once daily for an  additional 10 days.  Begin Senokot --> may take once or twice daily until bowel movements begin  F/u with your PCP       ED Prescriptions     Medication Sig Dispense Auth. Provider   polyethylene glycol (MIRALAX) 17 g packet Take 17 g by mouth 2 (two) times daily. Campbell, DO   senna-docusate (SENOKOT-S) 8.6-50 MG tablet Take 1 tablet by mouth 2 (two) times daily. 20 tablet Elba Barman, DO      PDMP not reviewed this encounter.   Elba Barman, DO 10/16/20 1555

## 2021-02-01 ENCOUNTER — Ambulatory Visit (HOSPITAL_COMMUNITY): Payer: Medicaid Other | Admitting: Clinical

## 2021-04-05 ENCOUNTER — Ambulatory Visit (INDEPENDENT_AMBULATORY_CARE_PROVIDER_SITE_OTHER): Payer: Medicaid Other | Admitting: Clinical

## 2021-04-05 DIAGNOSIS — F331 Major depressive disorder, recurrent, moderate: Secondary | ICD-10-CM | POA: Diagnosis not present

## 2021-04-05 DIAGNOSIS — F4310 Post-traumatic stress disorder, unspecified: Secondary | ICD-10-CM

## 2021-04-08 NOTE — Plan of Care (Signed)
Client was scheduled for next appointment. ?
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
## 2021-05-11 ENCOUNTER — Emergency Department (HOSPITAL_COMMUNITY)
Admission: EM | Admit: 2021-05-11 | Discharge: 2021-05-11 | Discharge status: Against Medical Advice | Payer: Medicaid Other | Attending: Emergency Medicine | Admitting: Emergency Medicine

## 2021-05-11 ENCOUNTER — Other Ambulatory Visit: Payer: Self-pay

## 2021-05-11 ENCOUNTER — Emergency Department (HOSPITAL_COMMUNITY): Payer: Medicaid Other

## 2021-05-11 ENCOUNTER — Encounter (HOSPITAL_COMMUNITY): Payer: Self-pay | Admitting: Pharmacy Technician

## 2021-05-11 DIAGNOSIS — R0602 Shortness of breath: Secondary | ICD-10-CM | POA: Insufficient documentation

## 2021-05-11 DIAGNOSIS — I10 Essential (primary) hypertension: Secondary | ICD-10-CM | POA: Insufficient documentation

## 2021-05-11 DIAGNOSIS — R200 Anesthesia of skin: Secondary | ICD-10-CM | POA: Diagnosis not present

## 2021-05-11 DIAGNOSIS — Z5321 Procedure and treatment not carried out due to patient leaving prior to being seen by health care provider: Secondary | ICD-10-CM | POA: Diagnosis not present

## 2021-05-11 DIAGNOSIS — E119 Type 2 diabetes mellitus without complications: Secondary | ICD-10-CM | POA: Diagnosis not present

## 2021-05-11 DIAGNOSIS — R079 Chest pain, unspecified: Secondary | ICD-10-CM | POA: Insufficient documentation

## 2021-05-11 LAB — BASIC METABOLIC PANEL
Anion gap: 13 (ref 5–15)
BUN: 6 mg/dL (ref 6–20)
CO2: 19 mmol/L — ABNORMAL LOW (ref 22–32)
Calcium: 9.7 mg/dL (ref 8.9–10.3)
Chloride: 101 mmol/L (ref 98–111)
Creatinine, Ser: 0.67 mg/dL (ref 0.44–1.00)
GFR, Estimated: >60 mL/min (ref >60)
Glucose, Bld: 171 mg/dL — ABNORMAL HIGH (ref 70–99)
Potassium: 3.5 mmol/L (ref 3.5–5.1)
Sodium: 133 mmol/L — ABNORMAL LOW (ref 135–145)

## 2021-05-11 LAB — CBC
HCT: 39.2 % (ref 36.0–46.0)
Hemoglobin: 13.9 g/dL (ref 12.0–15.0)
MCH: 34.2 pg — ABNORMAL HIGH (ref 26.0–34.0)
MCHC: 35.5 g/dL (ref 30.0–36.0)
MCV: 96.6 fL (ref 80.0–100.0)
Platelets: 226 10*3/uL (ref 150–400)
RBC: 4.06 MIL/uL (ref 3.87–5.11)
RDW: 11.2 % — ABNORMAL LOW (ref 11.5–15.5)
WBC: 10.5 10*3/uL (ref 4.0–10.5)
nRBC: 0 % (ref 0.0–0.2)

## 2021-05-11 LAB — TROPONIN I (HIGH SENSITIVITY): Troponin I (High Sensitivity): 4 ng/L (ref <18)

## 2021-05-11 MED ORDER — OXYCODONE-ACETAMINOPHEN 5-325 MG PO TABS: 1.0000 | ORAL_TABLET | Freq: Once | ORAL | Status: DC

## 2021-05-11 NOTE — ED Triage Notes (Signed)
Pt bib ems for chest pain and shob onset 0700, gradual worsening. Radiation to L shoulder and back. Pt reports abnormal sensation to L arm X2 weeks. Hx blood clot. VSS with EMS. Pt took a THC gummy at 0500. Pt took asa prior to EMS arrival and given 1 SL nitro en route.  ?154/108 ?CBG 174 ?RR 24 ?

## 2021-05-11 NOTE — ED Notes (Signed)
Pt called x2, no response.  ?

## 2021-05-11 NOTE — ED Provider Triage Note (Signed)
Emergency Medicine Provider Triage Evaluation Note ? ?Megan Riddle , a 46 y.o. female  was evaluated in triage.  Pt complains of chest pain, arm numbness and difficulty breathing.  She reports that this started around 430 or 5a when she took a 1 edible.  She thinks the edible was 50 mg and stronger than once that she usually takes. ? ?No history of hypertension, hyperlipidemia, chest pain.  History of diabetes ? ?Review of Systems  ?Positive: Chest pain left arm numbness/shortness of breath ?Negative:  ? ?Physical Exam  ?BP 129/79 (BP Location: Left Arm)   Pulse (!) 109   Temp 97.8 ?F (36.6 ?C)   Resp 19   LMP 12/20/2017 (Approximate) Comment: " I don't even remember".   SpO2 97%  ?Gen:   Awake, no distress   ?Resp:  Normal effort  ?MSK:   Moves extremities without difficulty  ?Other:  RRR, CTA B.  5 out of 5 strength in all extremities. ? ?Medical Decision Making  ?Medically screening exam initiated at 11:28 AM.  Appropriate orders placed.  [name] was informed that the remainder of the evaluation will be completed by another provider, this initial triage assessment does not replace that evaluation, and the importance of remaining in the ED until their evaluation is complete. ? ? ? ? ?  ?Rhae Hammock, PA-C ?05/11/21 1131 ? ?

## 2021-05-11 NOTE — ED Notes (Signed)
Pt called x3 for room transfer, no response.  ?

## 2021-06-11 ENCOUNTER — Ambulatory Visit (HOSPITAL_COMMUNITY): Payer: Medicaid Other | Admitting: Clinical

## 2021-06-26 ENCOUNTER — Ambulatory Visit (HOSPITAL_COMMUNITY): Payer: Medicaid Other | Admitting: Clinical

## 2021-08-14 DIAGNOSIS — L732 Hidradenitis suppurativa: Secondary | ICD-10-CM | POA: Insufficient documentation

## 2021-10-18 ENCOUNTER — Emergency Department (HOSPITAL_COMMUNITY)
Admission: EM | Admit: 2021-10-18 | Discharge: 2021-10-18 | Disposition: A | Discharge status: Home / Self Care | Payer: Medicaid Other | Attending: Emergency Medicine | Admitting: Emergency Medicine

## 2021-10-18 ENCOUNTER — Other Ambulatory Visit: Payer: Self-pay

## 2021-10-18 ENCOUNTER — Emergency Department (HOSPITAL_COMMUNITY): Payer: Medicaid Other

## 2021-10-18 ENCOUNTER — Encounter (HOSPITAL_COMMUNITY): Payer: Self-pay

## 2021-10-18 DIAGNOSIS — Z5321 Procedure and treatment not carried out due to patient leaving prior to being seen by health care provider: Secondary | ICD-10-CM | POA: Insufficient documentation

## 2021-10-18 DIAGNOSIS — R202 Paresthesia of skin: Secondary | ICD-10-CM | POA: Diagnosis not present

## 2021-10-18 DIAGNOSIS — R0602 Shortness of breath: Secondary | ICD-10-CM | POA: Diagnosis present

## 2021-10-18 DIAGNOSIS — R079 Chest pain, unspecified: Secondary | ICD-10-CM | POA: Diagnosis not present

## 2021-10-18 LAB — CBC
HCT: 37 % (ref 36.0–46.0)
Hemoglobin: 12.8 g/dL (ref 12.0–15.0)
MCH: 34.1 pg — ABNORMAL HIGH (ref 26.0–34.0)
MCHC: 34.6 g/dL (ref 30.0–36.0)
MCV: 98.7 fL (ref 80.0–100.0)
Platelets: 242 10*3/uL (ref 150–400)
RBC: 3.75 MIL/uL — ABNORMAL LOW (ref 3.87–5.11)
RDW: 11.9 % (ref 11.5–15.5)
WBC: 7.5 10*3/uL (ref 4.0–10.5)
nRBC: 0 % (ref 0.0–0.2)

## 2021-10-18 LAB — URINALYSIS, ROUTINE W REFLEX MICROSCOPIC
Bacteria, UA: NONE SEEN
Bilirubin Urine: NEGATIVE
Glucose, UA: NEGATIVE mg/dL
Ketones, ur: NEGATIVE mg/dL
Nitrite: NEGATIVE
Protein, ur: 30 mg/dL — AB
Specific Gravity, Urine: 1.023 (ref 1.005–1.030)
pH: 5 (ref 5.0–8.0)

## 2021-10-18 LAB — BASIC METABOLIC PANEL
Anion gap: 8 (ref 5–15)
BUN: 11 mg/dL (ref 6–20)
CO2: 24 mmol/L (ref 22–32)
Calcium: 9.7 mg/dL (ref 8.9–10.3)
Chloride: 106 mmol/L (ref 98–111)
Creatinine, Ser: 0.59 mg/dL (ref 0.44–1.00)
GFR, Estimated: >60 mL/min (ref >60)
Glucose, Bld: 214 mg/dL — ABNORMAL HIGH (ref 70–99)
Potassium: 3.6 mmol/L (ref 3.5–5.1)
Sodium: 138 mmol/L (ref 135–145)

## 2021-10-18 MED ORDER — GADOPICLENOL 0.5 MMOL/ML IV SOLN
9.0000 mL | Freq: Once | INTRAVENOUS | Status: AC | PRN
  Administered 2021-10-18: 9 mL via INTRAVENOUS

## 2021-10-18 NOTE — ED Notes (Signed)
Pt states they are having lower back pain and the back of their legs feel numb

## 2021-10-18 NOTE — ED Provider Triage Note (Signed)
Emergency Medicine Provider Triage Evaluation Note  Megan Riddle , a 46 y.o. female  was evaluated in triage.  Pt complains of left lower back pain for the last 3 days.  Patient denies any heavy lifting.  Patient states that she has been unable to control her bowel since this time, urinating under self.  Patient reports that this is not an issue of not being able to get to the bathroom in time, this is an issue of being able to control her bowel or bladder.  The patient is also endorsing lower extremity weakness, denies any groin numbness.  Patient was seen at her doctor yesterday and diagnosed with lumbar radiculopathy.  Patient also stating that her blood sugar is out of control.  Patient reports that her CBG was in the 300s.  Patient reports recently being placed on prednisone Dosepak.  Review of Systems  Positive:  Negative:   Physical Exam  BP (!) 129/91 (BP Location: Right Arm)   Pulse 78   Temp 99.1 F (37.3 C) (Oral)   Resp 20   Ht '5\' 1"'$  (1.549 m)   Wt 92.5 kg   LMP 12/20/2017 (Approximate) Comment: " I don't even remember".   SpO2 100%   BMI 38.55 kg/m  Gen:   Awake, no distress   Resp:  Normal effort  MSK:   Moves extremities without difficulty  Other:  2 out of 5 left lower extremity strength.  Medical Decision Making  Medically screening exam initiated at 11:15 AM.  Appropriate orders placed.  Megan Riddle was informed that the remainder of the evaluation will be completed by another provider, this initial triage assessment does not replace that evaluation, and the importance of remaining in the ED until their evaluation is complete.     Azucena Cecil, PA-C 10/18/21 1117

## 2021-10-18 NOTE — ED Triage Notes (Signed)
EMS stated, 3 days of SOB and tingling in her legs and her lower back. Has some chest pain. Hx of stents

## 2021-10-18 NOTE — ED Notes (Signed)
Pt has left AMA.

## 2022-04-22 DIAGNOSIS — M5416 Radiculopathy, lumbar region: Secondary | ICD-10-CM | POA: Insufficient documentation

## 2022-04-26 ENCOUNTER — Ambulatory Visit (INDEPENDENT_AMBULATORY_CARE_PROVIDER_SITE_OTHER): Payer: Medicaid Other | Admitting: Primary Care

## 2022-04-26 ENCOUNTER — Encounter: Payer: Self-pay | Admitting: Primary Care

## 2022-04-26 VITALS — BP 106/60 | HR 73 | Temp 98.7°F | Ht 62.0 in | Wt 193.4 lb

## 2022-04-26 DIAGNOSIS — Z8669 Personal history of other diseases of the nervous system and sense organs: Secondary | ICD-10-CM

## 2022-04-26 DIAGNOSIS — Z720 Tobacco use: Secondary | ICD-10-CM | POA: Diagnosis not present

## 2022-04-26 DIAGNOSIS — G4733 Obstructive sleep apnea (adult) (pediatric): Secondary | ICD-10-CM

## 2022-04-26 NOTE — Assessment & Plan Note (Signed)
-   NPSG in May 2018, AHI 25.3/hour with Spo2 low 84%. Weight is down 50 lbs since last sleep study. Patient has been off CPAP for 1.5 years. Symptoms initially improved with weight loss but have worsened recently. She has symptoms of loud snoring and witnessed apnea. Needs repeat sleep study to re-assess OSA, we can then place an order to re-start CPAP if needed.  Reviewed risks of untreated sleep apnea including cardiac arrhythmias, pulmonary hypertension, diabetes and stroke.  We also discussed treatment options including weight loss, oral appliance, CPAP therapy or referral to ENT for possible surgical options. Encourage side sleeping position and weight loss. FU 1-2 weeks after sleep study results.

## 2022-04-26 NOTE — Progress Notes (Addendum)
@Patient  ID: Megan Riddle, female    DOB: 01-11-1976, 47 y.o.   MRN: [id number]  Chief Complaint  Patient presents with   Consult    Lost weight, did not need CPAP.  Has not used in over a year.  Lost CPAP machine in storage.  Having snoring and daytime fatigue again.    Referring provider: [facility]  HPI: 47 year old female, current every day smoker. PMH significant for sleep apnea, hyperlipidemia, type 2 diabetes, obesity, PTSD, nicotine dependence.   04/26/2022 Patient presents today for sleep consult.  Dx with sleep apnea in 2013. PSG in May 2018, AHI 25.3/hour with Spo2 low 84%.  She has been off CPAP for 1.5 years. She lost her machine in storage facility. She had lost weight and symptoms improved but she has slowly regained some weight. She has snoring symptoms and has been told by by her son that she stops breathing in her sleep. She will at times wake herself up gasping for air. She is a side sleeper. Typical bedtime is between 8-9pm. It takes her an hour to fall asleep. She wakes up to 4 times a night. She starts her day at [place].  Denies symptoms of narcolepsy, cataplexy, sleepwalking.   She is a current smoker, cut down to 4 cigarettes a day. She has picked a quit date for this Sunday.     No Known Allergies  Immunization History  Administered Date(s) Administered   Influenza Inj Mdck Quad Pf 10/17/2021   Influenza,inj,Quad PF,6+ Mos 01/13/2017, 03/24/2018   Influenza-Unspecified 10/02/2015   PFIZER(Purple Top)SARS-COV-2 Vaccination 09/19/2019, 10/15/2019   Pneumococcal Polysaccharide-23 11/27/2012    Past Medical History:  Diagnosis Date   Arthritis    Cancer ([place])    breast   Diabetes mellitus without complication ([place])     Tobacco History: Social History   Tobacco Use  Smoking Status Every Day   Packs/day: 0.50   Years: 27.00   Additional pack years: 0.00   Total pack years: 13.50   Types: Cigarettes  Smokeless Tobacco Never   Tobacco Comments   Down to 4 cigarettes per day.  Trying to quit.  04/26/2022 hfb   Ready to quit: Not Answered Counseling given: Not Answered Tobacco comments: Down to 4 cigarettes per day.  Trying to quit.  04/26/2022 hfb   Outpatient Medications Prior to Visit  Medication Sig Dispense Refill   Cyanocobalamin (VITAMIN B 12 PO) Take 1 tablet by mouth daily.     fexofenadine (ALLEGRA) 180 MG tablet Take 180 mg by mouth daily.     lisinopril (PRINIVIL,ZESTRIL) 2.5 MG tablet Take 2.5 mg by mouth daily as needed (high blood pressure).   11   metFORMIN (GLUCOPHAGE) 500 MG tablet Take 2 tablets (1,000 mg total) by mouth 2 (two) times daily. (Patient taking differently: Take 1,000 mg by mouth 2 (two) times daily as needed (low blood sugar).) 60 tablet 0   Multiple Vitamin (MULTIVITAMIN WITH MINERALS) TABS tablet Take 1 tablet by mouth daily.     polyethylene glycol (MIRALAX) 17 g packet Take 17 g by mouth 2 (two) times daily. 14 each 0   senna-docusate (SENOKOT-S) 8.6-50 MG tablet Take 1 tablet by mouth 2 (two) times daily. 20 tablet 0   TURMERIC PO Take 1 tablet by mouth daily.     Vitamins/Minerals TABS Take by mouth.     rivaroxaban (XARELTO) 20 MG TABS tablet Take 1 tablet (20 mg total) by mouth daily with supper. (Patient not taking: Reported  on 06/19/2019) 30 tablet 5   No facility-administered medications prior to visit.   Review of Systems  Review of Systems  Constitutional:  Positive for fatigue.  HENT: Negative.    Respiratory: Negative.    Cardiovascular: Negative.   Psychiatric/Behavioral:  Positive for sleep disturbance.    Physical Exam  BP 106/60 (BP Location: Left Arm, Patient Position: Sitting, Cuff Size: Large)   Pulse 73   Temp 98.7 F (37.1 C) (Oral)   Ht 5\' 2"  (1.575 m)   Wt 193 lb 6.4 oz (87.7 kg)   LMP 12/20/2017 (Approximate) Comment: " I don't even remember".   SpO2 100%   BMI 35.37 kg/m  Physical Exam Constitutional:      General: She is not in acute  distress.    Appearance: Normal appearance. She is not ill-appearing.  HENT:     Head: Normocephalic and atraumatic.     Mouth/Throat:     Mouth: Mucous membranes are moist.     Pharynx: Oropharynx is clear.  Cardiovascular:     Rate and Rhythm: Normal rate and regular rhythm.  Pulmonary:     Effort: Pulmonary effort is normal.     Breath sounds: Normal breath sounds.  Musculoskeletal:        General: Normal range of motion.  Skin:    General: Skin is warm and dry.  Neurological:     General: No focal deficit present.     Mental Status: She is alert and oriented to person, place, and time. Mental status is at baseline.  Psychiatric:        Mood and Affect: Mood normal.        Behavior: Behavior normal.        Thought Content: Thought content normal.        Judgment: Judgment normal.      Lab Results:  CBC    Component Value Date/Time   WBC 7.5 10/18/2021 1128   RBC 3.75 (L) 10/18/2021 1128   HGB 12.8 10/18/2021 1128   HCT 37.0 10/18/2021 1128   PLT 242 10/18/2021 1128   MCV 98.7 10/18/2021 1128   MCH 34.1 (H) 10/18/2021 1128   MCHC 34.6 10/18/2021 1128   RDW 11.9 10/18/2021 1128   LYMPHSABS 3.6 06/19/2019 1411   MONOABS 0.6 06/19/2019 1411   EOSABS 0.1 06/19/2019 1411   BASOSABS 0.0 06/19/2019 1411    BMET    Component Value Date/Time   NA 138 10/18/2021 1128   K 3.6 10/18/2021 1128   CL 106 10/18/2021 1128   CO2 24 10/18/2021 1128   GLUCOSE 214 (H) 10/18/2021 1128   BUN 11 10/18/2021 1128   CREATININE 0.59 10/18/2021 1128   CALCIUM 9.7 10/18/2021 1128   GFRNONAA >60 10/18/2021 1128   GFRAA >60 06/19/2019 1411    BNP No results found for: "BNP"  ProBNP No results found for: "PROBNP"  Imaging: No results found.   Assessment & Plan:   OSA (obstructive sleep apnea) - NPSG in May 2018, AHI 25.3/hour with Spo2 low 84%. Weight is down 50 lbs since last sleep study. Patient has been off CPAP for 1.5 years. Symptoms initially improved with weight  loss but have worsened recently. She has symptoms of loud snoring and witnessed apnea. Needs repeat sleep study to re-assess OSA, we can then place an order to re-start CPAP if needed.  Reviewed risks of untreated sleep apnea including cardiac arrhythmias, pulmonary hypertension, diabetes and stroke.  We also discussed treatment options including weight loss, oral  appliance, CPAP therapy or referral to ENT for possible surgical options. Encourage side sleeping position and weight loss. FU 1-2 weeks after sleep study results.   Tobacco user - Current smoker, she has cut back to 4 cigarettes a day. She has picked quit date for March 31st. Smoking cessation encourage and support provided.   Addendum: Needs in-lab sleep study d/t insurance  Martyn Ehrich, NP 04/26/2022

## 2022-04-26 NOTE — Patient Instructions (Addendum)
  Sleep apnea is defined as period of 10 seconds or longer when you stop breathing at night. This can happen multiple times a night. Dx sleep apnea is when this occurs more than 5 times an hour.    Mild OSA 5-15 apneic events an hour Moderate OSA 15-30 apneic events an hour Severe OSA > 30 apneic events an hour   Untreated sleep apnea puts you at higher risk for cardiac arrhythmias, pulmonary HTN, stroke and diabetes  Treatment options include weight loss, side sleeping position, oral appliance, CPAP therapy or referral to ENT for possible surgical options    Recommendations: Focus on side sleeping position or elevate head with wedge pillow 30 degrees Work on weight loss efforts if able  Do not drive if experiencing excessive daytime sleepiness of fatigue    Orders: Home sleep study re: loud snoring (ordered)   Follow-up: Please call to schedule follow-up 1-2 weeks after completing home sleep study to review results and treatment if needed (can be virtual) 

## 2022-04-26 NOTE — Assessment & Plan Note (Addendum)
-   Current smoker, she has cut back to 4 cigarettes a day. She has picked quit date for March 31st. Smoking cessation encourage and support provided.

## 2022-04-26 NOTE — Addendum Note (Signed)
Addended by: Martyn Ehrich on: 04/26/2022 01:33 PM   Modules accepted: Orders

## 2022-04-29 NOTE — Progress Notes (Signed)
Reviewed and agree with assessment/plan.   Chesley Mires, MD [facility] 04/29/2022, 8:00 AM Pager:  [phone]

## 2022-05-12 ENCOUNTER — Ambulatory Visit (HOSPITAL_BASED_OUTPATIENT_CLINIC_OR_DEPARTMENT_OTHER): Payer: Medicaid Other | Attending: Primary Care

## 2022-05-28 ENCOUNTER — Ambulatory Visit: Payer: Medicaid Other | Admitting: Podiatry

## 2022-05-28 ENCOUNTER — Encounter: Payer: Self-pay | Admitting: Podiatry

## 2022-05-28 ENCOUNTER — Ambulatory Visit (INDEPENDENT_AMBULATORY_CARE_PROVIDER_SITE_OTHER): Payer: Medicaid Other

## 2022-05-28 DIAGNOSIS — M7661 Achilles tendinitis, right leg: Secondary | ICD-10-CM

## 2022-05-28 MED ORDER — MELOXICAM 15 MG PO TABS: 15.0000 mg | ORAL_TABLET | Freq: Every day | ORAL | 3 refills | Status: DC

## 2022-05-28 MED ORDER — DEXAMETHASONE SODIUM PHOSPHATE 120 MG/30ML IJ SOLN
2.0000 mg | Freq: Once | INTRAMUSCULAR | Status: AC
  Administered 2022-05-28: 2 mg via INTRA_ARTICULAR

## 2022-05-28 NOTE — Progress Notes (Signed)
Subjective:  Patient ID: Megan Riddle, female    DOB: Oct 09, 1975,  MRN: [id number] HPI Chief Complaint  Patient presents with   Foot Pain    Posterior heel right - aching, feels a knot x 2 months, tried soaking, Ibuprofen-no help, no injury   Diabetes    Last A1c was 9.9   New Patient (Initial Visit)    47 y.o. female presents with the above complaint.   ROS: Denies fever chills nausea vomit muscle aches pains calf pain back pain chest pain shortness of breath.  Past Medical History:  Diagnosis Date   Arthritis    Cancer ([facility])    breast   Diabetes mellitus without complication ([facility])    Past Surgical History:  Procedure Laterality Date   FEMORAL-FEMORAL BYPASS GRAFT Left 03/22/2018   Procedure: LEFT ILIAC ARTERTY EMBOLECTOMY;  Surgeon: Chuck Hint, MD;  Location: [facility] OR;  Service: Vascular;  Laterality: Left;   INTRAOPERATIVE ARTERIOGRAM Left 03/22/2018   Procedure: Intra Operative Arteriogram LEFT LOWER LEG;  Surgeon: Chuck Hint, MD;  Location: [facility] OR;  Service: Vascular;  Laterality: Left;   PATCH ANGIOPLASTY Left 03/22/2018   Procedure: PATCH ANGIOPLASTY OF LEFT FEMORAL ARTERY;  Surgeon: Chuck Hint, MD;  Location: [facility] OR;  Service: Vascular;  Laterality: Left;    Current Outpatient Medications:    gabapentin (NEURONTIN) 300 MG capsule, Take by mouth., Disp: , Rfl:    meloxicam (MOBIC) 15 MG tablet, Take 1 tablet (15 mg total) by mouth daily., Disp: 30 tablet, Rfl: 3   VRAYLAR 1.5 MG capsule, Take 1.5 mg by mouth daily., Disp: , Rfl:    Cyanocobalamin (VITAMIN B 12 PO), Take 1 tablet by mouth daily., Disp: , Rfl:    fexofenadine (ALLEGRA) 180 MG tablet, Take 180 mg by mouth daily., Disp: , Rfl:    glipiZIDE (GLUCOTROL) 5 MG tablet, Take 5 mg by mouth 2 (two) times daily., Disp: , Rfl:    lisinopril (PRINIVIL,ZESTRIL) 2.5 MG tablet, Take 2.5 mg by mouth daily as needed (high blood pressure). , Disp: , Rfl: 11   metFORMIN (GLUCOPHAGE) 500 MG  tablet, Take 2 tablets (1,000 mg total) by mouth 2 (two) times daily. (Patient taking differently: Take 1,000 mg by mouth 2 (two) times daily as needed (low blood sugar).), Disp: 60 tablet, Rfl: 0   Multiple Vitamin (MULTIVITAMIN WITH MINERALS) TABS tablet, Take 1 tablet by mouth daily., Disp: , Rfl:    polyethylene glycol (MIRALAX) 17 g packet, Take 17 g by mouth 2 (two) times daily., Disp: 14 each, Rfl: 0   senna-docusate (SENOKOT-S) 8.6-50 MG tablet, Take 1 tablet by mouth 2 (two) times daily., Disp: 20 tablet, Rfl: 0   TURMERIC PO, Take 1 tablet by mouth daily., Disp: , Rfl:    Vitamins/Minerals TABS, Take by mouth., Disp: , Rfl:   No Known Allergies Review of Systems Objective:  There were no vitals filed for this visit.  General: Well developed, nourished, in no acute distress, alert and oriented x3   Dermatological: Skin is warm, dry and supple bilateral. Nails x 10 are well maintained; remaining integument appears unremarkable at this time. There are no open sores, no preulcerative lesions, no rash or signs of infection present.  Vascular: Dorsalis Pedis artery and Posterior Tibial artery pedal pulses are 2/4 bilateral with immedate capillary fill time. Pedal hair growth present. No varicosities and no lower extremity edema present bilateral.   Neruologic: Grossly intact via light touch bilateral. Vibratory intact via tuning fork bilateral.  Protective threshold with Semmes Wienstein monofilament intact to all pedal sites bilateral. Patellar and Achilles deep tendon reflexes 2+ bilateral. No Babinski or clonus noted bilateral.   Musculoskeletal: No gross boney pedal deformities bilateral. No pain, crepitus, or limitation noted with foot and ankle range of motion bilateral. Muscular strength 5/5 in all groups tested bilateral.  She has pain with palpation of the Achilles tendon as it inserts on the right heel posteriorly.  At unable to palpate a bursa at this point though radiographically  it does demonstrate soft tissue swelling.  Gait: Unassisted, Nonantalgic.    Radiographs:  3 views of the right foot demonstrate osseously mature individual small fluid retention overlying the posterior superior aspect of the calcaneus at the insertion of the Achilles tendon.  The Achilles tendon appears to be intact margins appear to be normal  Assessment & Plan:   Assessment: Insertional Achilles tendinitis.  Plan: After sterile Betadine skin prep I injected 2 mg of dexamethasone into the pre-Achilles bursa area.  She tolerated procedure well placed her in a cam boot and discussed starting her on meloxicam 15 mg 1 p.o. daily I like to follow-up with her in about a month.     Victorious Cosio T. [place], [place]

## 2022-06-03 ENCOUNTER — Encounter: Payer: Self-pay | Admitting: Podiatry

## 2022-06-03 ENCOUNTER — Telehealth: Payer: Self-pay | Admitting: Podiatry

## 2022-06-03 NOTE — Telephone Encounter (Signed)
Pt called back asking for the note and I checked chart and seen it was ok to write note.  I have written note and pt asked for it to be emailed to her at [email or web address]. And I have emailed it to pt.

## 2022-06-03 NOTE — Telephone Encounter (Signed)
Pt is requesting a Work Note. Pt would like to return back to work on 06/04/22 with no limitations.   Please Advise

## 2022-06-05 ENCOUNTER — Other Ambulatory Visit: Payer: Self-pay | Admitting: *Deleted

## 2022-06-05 ENCOUNTER — Ambulatory Visit (HOSPITAL_BASED_OUTPATIENT_CLINIC_OR_DEPARTMENT_OTHER): Payer: Medicaid Other | Attending: Primary Care | Admitting: Pulmonary Disease

## 2022-06-05 DIAGNOSIS — Z8669 Personal history of other diseases of the nervous system and sense organs: Secondary | ICD-10-CM

## 2022-06-05 DIAGNOSIS — I745 Embolism and thrombosis of iliac artery: Secondary | ICD-10-CM

## 2022-06-05 DIAGNOSIS — G4733 Obstructive sleep apnea (adult) (pediatric): Secondary | ICD-10-CM | POA: Diagnosis present

## 2022-06-06 DIAGNOSIS — Z8669 Personal history of other diseases of the nervous system and sense organs: Secondary | ICD-10-CM

## 2022-06-06 DIAGNOSIS — G4733 Obstructive sleep apnea (adult) (pediatric): Secondary | ICD-10-CM | POA: Diagnosis not present

## 2022-06-06 NOTE — Procedures (Signed)
     Patient Name: Megan Riddle, Megan Riddle Date: 06/05/2022 Gender: Female D.O.B: 1975-03-25 Age (years): 47 Referring Provider: Ames Dura NP Height (inches): 62 Interpreting Physician: Coralyn Helling MD, ABSM Weight (lbs): 205 RPSGT: Lowry Ram BMI: 37 MRN: [id number] Neck Size: 17.00  CLINICAL INFORMATION Sleep Study Type: NPSG  Indication for sleep study: Depression, Diabetes, Fatigue, Obesity, Re-Evaluation, Snoring  Epworth Sleepiness Score: 15  SLEEP STUDY TECHNIQUE As per the AASM Manual for the Scoring of Sleep and Associated Events v2.3 (April 2016) with a hypopnea requiring 4% desaturations.  The channels recorded and monitored were frontal, central and occipital EEG, electrooculogram (EOG), submentalis EMG (chin), nasal and oral airflow, thoracic and abdominal wall motion, anterior tibialis EMG, snore microphone, electrocardiogram, and pulse oximetry.  MEDICATIONS Medications self-administered by patient taken the night of the study : N/A  SLEEP ARCHITECTURE The study was initiated at 9:46:16 PM and ended at 4:32:08 AM.  Sleep onset time was 0.3 minutes and the sleep efficiency was 87.6%. The total sleep time was 355.5 minutes.  Stage REM latency was 220.0 minutes.  The patient spent 9.6% of the night in stage N1 sleep, 69.1% in stage N2 sleep, 0.0% in stage N3 and 21.4% in REM.  Alpha intrusion was absent.  Supine sleep was 6.47%.  RESPIRATORY PARAMETERS The overall apnea/hypopnea index (AHI) was 24.3 per hour. There were 25 total apneas, including 25 obstructive, 0 central and 0 mixed apneas. There were 119 hypopneas and 49 RERAs.  The AHI during Stage REM sleep was 45.0 per hour.  AHI while supine was 7.8 per hour.  The mean oxygen saturation was 94.0%. The minimum SpO2 during sleep was 80.0%.  soft snoring was noted during this study.  CARDIAC DATA The 2 lead EKG demonstrated sinus rhythm. The mean heart rate was 64.5 beats per minute.  Other EKG findings include: None.  LEG MOVEMENT DATA The total PLMS were 0 with a resulting PLMS index of 0.0. Associated arousal with leg movement index was 1.2 .  IMPRESSIONS - Moderate obstructive sleep apnea with an AHI of 24.3 and SpO2 low of 80%.  Significant REM effect.  She slept mostly in the non-supine position.  DIAGNOSIS - Obstructive Sleep Apnea (G47.33)  RECOMMENDATIONS - Additional therapies include CPAP, oral appliance, or surgical assessment. - Avoid alcohol, sedatives and other CNS depressants that may worsen sleep apnea and disrupt normal sleep architecture. - Sleep hygiene should be reviewed to assess factors that may improve sleep quality. - Weight management and regular exercise should be initiated or continued if appropriate.  [Electronically signed] 06/06/2022 02:05 PM  Coralyn Helling MD, ABSM Diplomate, American Board of Sleep Medicine NPI: [id number]  [facility] PH: [phone]   FX: [phone] ACCREDITED BY THE AMERICAN ACADEMY OF SLEEP MEDICINE

## 2022-06-12 ENCOUNTER — Ambulatory Visit (HOSPITAL_COMMUNITY): Admission: RE | Admit: 2022-06-12 | Payer: Medicaid Other | Source: Ambulatory Visit

## 2022-06-27 ENCOUNTER — Encounter: Payer: Medicaid Other | Admitting: Vascular Surgery

## 2022-07-25 ENCOUNTER — Emergency Department (HOSPITAL_COMMUNITY): Payer: Medicaid Other

## 2022-07-25 ENCOUNTER — Other Ambulatory Visit: Payer: Self-pay

## 2022-07-25 ENCOUNTER — Observation Stay (HOSPITAL_COMMUNITY)
Admission: EM | Admit: 2022-07-25 | Discharge: 2022-07-26 | Disposition: A | Discharge status: Home / Self Care | Payer: Medicaid Other | Attending: Internal Medicine | Admitting: Internal Medicine

## 2022-07-25 ENCOUNTER — Encounter (HOSPITAL_COMMUNITY): Payer: Self-pay

## 2022-07-25 DIAGNOSIS — R1013 Epigastric pain: Secondary | ICD-10-CM | POA: Diagnosis not present

## 2022-07-25 DIAGNOSIS — R8290 Unspecified abnormal findings in urine: Secondary | ICD-10-CM | POA: Insufficient documentation

## 2022-07-25 DIAGNOSIS — Z86718 Personal history of other venous thrombosis and embolism: Secondary | ICD-10-CM | POA: Insufficient documentation

## 2022-07-25 DIAGNOSIS — I10 Essential (primary) hypertension: Secondary | ICD-10-CM | POA: Insufficient documentation

## 2022-07-25 DIAGNOSIS — E119 Type 2 diabetes mellitus without complications: Secondary | ICD-10-CM | POA: Insufficient documentation

## 2022-07-25 DIAGNOSIS — Z853 Personal history of malignant neoplasm of breast: Secondary | ICD-10-CM | POA: Diagnosis not present

## 2022-07-25 DIAGNOSIS — R21 Rash and other nonspecific skin eruption: Secondary | ICD-10-CM | POA: Insufficient documentation

## 2022-07-25 DIAGNOSIS — R112 Nausea with vomiting, unspecified: Secondary | ICD-10-CM | POA: Diagnosis not present

## 2022-07-25 DIAGNOSIS — Z79899 Other long term (current) drug therapy: Secondary | ICD-10-CM | POA: Insufficient documentation

## 2022-07-25 DIAGNOSIS — R42 Dizziness and giddiness: Secondary | ICD-10-CM | POA: Diagnosis present

## 2022-07-25 DIAGNOSIS — E0590 Thyrotoxicosis, unspecified without thyrotoxic crisis or storm: Secondary | ICD-10-CM | POA: Insufficient documentation

## 2022-07-25 DIAGNOSIS — Z7984 Long term (current) use of oral hypoglycemic drugs: Secondary | ICD-10-CM | POA: Insufficient documentation

## 2022-07-25 DIAGNOSIS — Z955 Presence of coronary angioplasty implant and graft: Secondary | ICD-10-CM | POA: Insufficient documentation

## 2022-07-25 DIAGNOSIS — R109 Unspecified abdominal pain: Secondary | ICD-10-CM | POA: Diagnosis present

## 2022-07-25 HISTORY — DX: Acute embolism and thrombosis of unspecified deep veins of unspecified lower extremity: I82.409

## 2022-07-25 LAB — CBC WITH DIFFERENTIAL/PLATELET
Abs Immature Granulocytes: 0.03 10*3/uL (ref 0.00–0.07)
Basophils Absolute: 0.1 10*3/uL (ref 0.0–0.1)
Basophils Relative: 1 %
Eosinophils Absolute: 0.2 10*3/uL (ref 0.0–0.5)
Eosinophils Relative: 2 %
HCT: 38.8 % (ref 36.0–46.0)
Hemoglobin: 13.2 g/dL (ref 12.0–15.0)
Immature Granulocytes: 0 %
Lymphocytes Relative: 34 %
Lymphs Abs: 3.5 10*3/uL (ref 0.7–4.0)
MCH: 33.1 pg (ref 26.0–34.0)
MCHC: 34 g/dL (ref 30.0–36.0)
MCV: 97.2 fL (ref 80.0–100.0)
Monocytes Absolute: 0.7 10*3/uL (ref 0.1–1.0)
Monocytes Relative: 7 %
Neutro Abs: 5.8 10*3/uL (ref 1.7–7.7)
Neutrophils Relative %: 56 %
Platelets: 243 10*3/uL (ref 150–400)
RBC: 3.99 MIL/uL (ref 3.87–5.11)
RDW: 11.7 % (ref 11.5–15.5)
WBC: 10.3 10*3/uL (ref 4.0–10.5)
nRBC: 0 % (ref 0.0–0.2)

## 2022-07-25 LAB — URINALYSIS, W/ REFLEX TO CULTURE (INFECTION SUSPECTED)
Glucose, UA: NEGATIVE mg/dL
Ketones, ur: NEGATIVE mg/dL
Leukocytes,Ua: NEGATIVE
Nitrite: NEGATIVE
Protein, ur: 100 mg/dL — AB
RBC / HPF: >50 RBC/hpf (ref 0–5)
Specific Gravity, Urine: >1.03 — ABNORMAL HIGH (ref 1.005–1.030)
pH: 6 (ref 5.0–8.0)

## 2022-07-25 LAB — COMPREHENSIVE METABOLIC PANEL
ALT: 34 U/L (ref 0–44)
AST: 26 U/L (ref 15–41)
Albumin: 4 g/dL (ref 3.5–5.0)
Alkaline Phosphatase: 34 U/L — ABNORMAL LOW (ref 38–126)
Anion gap: 11 (ref 5–15)
BUN: 12 mg/dL (ref 6–20)
CO2: 23 mmol/L (ref 22–32)
Calcium: 9.7 mg/dL (ref 8.9–10.3)
Chloride: 99 mmol/L (ref 98–111)
Creatinine, Ser: 0.65 mg/dL (ref 0.44–1.00)
GFR, Estimated: >60 mL/min (ref >60)
Glucose, Bld: 207 mg/dL — ABNORMAL HIGH (ref 70–99)
Potassium: 3 mmol/L — ABNORMAL LOW (ref 3.5–5.1)
Sodium: 133 mmol/L — ABNORMAL LOW (ref 135–145)
Total Bilirubin: 1.1 mg/dL (ref 0.3–1.2)
Total Protein: 7.4 g/dL (ref 6.5–8.1)

## 2022-07-25 LAB — HIV ANTIBODY (ROUTINE TESTING W REFLEX): HIV Screen 4th Generation wRfx: NONREACTIVE

## 2022-07-25 LAB — TSH: TSH: 0.012 u[IU]/mL — ABNORMAL LOW (ref 0.350–4.500)

## 2022-07-25 LAB — PREGNANCY, URINE: Preg Test, Ur: NEGATIVE

## 2022-07-25 LAB — GLUCOSE, CAPILLARY: Glucose-Capillary: 206 mg/dL — ABNORMAL HIGH (ref 70–99)

## 2022-07-25 LAB — T4, FREE: Free T4: 1.07 ng/dL (ref 0.61–1.12)

## 2022-07-25 LAB — CK: Total CK: 301 U/L — ABNORMAL HIGH (ref 38–234)

## 2022-07-25 MED ORDER — INSULIN ASPART 100 UNIT/ML IJ SOLN
0.0000 [IU] | Freq: Three times a day (TID) | INTRAMUSCULAR | Status: DC
  Administered 2022-07-26: 3 [IU] via SUBCUTANEOUS
  Administered 2022-07-26: 2 [IU] via SUBCUTANEOUS

## 2022-07-25 MED ORDER — IOHEXOL 350 MG/ML SOLN
75.0000 mL | Freq: Once | INTRAVENOUS | Status: AC | PRN
  Administered 2022-07-25: 75 mL via INTRAVENOUS

## 2022-07-25 MED ORDER — ACETAMINOPHEN 650 MG RE SUPP: 650.0000 mg | Freq: Four times a day (QID) | RECTAL | Status: DC | PRN

## 2022-07-25 MED ORDER — ENOXAPARIN SODIUM 40 MG/0.4ML IJ SOSY
40.0000 mg | PREFILLED_SYRINGE | INTRAMUSCULAR | Status: DC
  Administered 2022-07-25: 40 mg via SUBCUTANEOUS
  Filled 2022-07-25: qty 0.4

## 2022-07-25 MED ORDER — ONDANSETRON HCL 4 MG/2ML IJ SOLN: 4.0000 mg | Freq: Four times a day (QID) | INTRAMUSCULAR | Status: DC | PRN

## 2022-07-25 MED ORDER — HYDROXYZINE HCL 10 MG PO TABS: 10.0000 mg | ORAL_TABLET | Freq: Three times a day (TID) | ORAL | Status: DC | PRN

## 2022-07-25 MED ORDER — NICOTINE 14 MG/24HR TD PT24
14.0000 mg | MEDICATED_PATCH | Freq: Every day | TRANSDERMAL | Status: DC
  Administered 2022-07-25 – 2022-07-26 (×2): 14 mg via TRANSDERMAL
  Filled 2022-07-25 (×2): qty 1

## 2022-07-25 MED ORDER — ONDANSETRON HCL 4 MG PO TABS: 4.0000 mg | ORAL_TABLET | Freq: Four times a day (QID) | ORAL | Status: DC | PRN

## 2022-07-25 MED ORDER — POTASSIUM CHLORIDE CRYS ER 20 MEQ PO TBCR: 40.0000 meq | EXTENDED_RELEASE_TABLET | Freq: Two times a day (BID) | ORAL | Status: DC

## 2022-07-25 MED ORDER — INSULIN ASPART 100 UNIT/ML IJ SOLN
0.0000 [IU] | Freq: Every day | INTRAMUSCULAR | Status: DC
  Administered 2022-07-25: 2 [IU] via SUBCUTANEOUS

## 2022-07-25 MED ORDER — POTASSIUM CHLORIDE CRYS ER 20 MEQ PO TBCR: 40.0000 meq | EXTENDED_RELEASE_TABLET | Freq: Once | ORAL | Status: DC

## 2022-07-25 MED ORDER — PANTOPRAZOLE SODIUM 40 MG PO TBEC
40.0000 mg | DELAYED_RELEASE_TABLET | Freq: Every day | ORAL | Status: DC
  Administered 2022-07-25 – 2022-07-26 (×2): 40 mg via ORAL
  Filled 2022-07-25 (×2): qty 1

## 2022-07-25 MED ORDER — ACETAMINOPHEN 325 MG PO TABS
650.0000 mg | ORAL_TABLET | Freq: Four times a day (QID) | ORAL | Status: DC | PRN
  Administered 2022-07-25: 650 mg via ORAL
  Filled 2022-07-25: qty 2

## 2022-07-25 MED ORDER — POTASSIUM CHLORIDE CRYS ER 20 MEQ PO TBCR
40.0000 meq | EXTENDED_RELEASE_TABLET | Freq: Two times a day (BID) | ORAL | Status: AC
  Administered 2022-07-25 – 2022-07-26 (×2): 40 meq via ORAL
  Filled 2022-07-25 (×2): qty 2

## 2022-07-25 MED ORDER — POLYETHYLENE GLYCOL 3350 17 G PO PACK: 17.0000 g | PACK | Freq: Every day | ORAL | Status: DC | PRN

## 2022-07-25 MED ORDER — NICOTINE POLACRILEX 2 MG MT GUM: 2.0000 mg | CHEWING_GUM | OROMUCOSAL | Status: DC | PRN

## 2022-07-25 NOTE — Hospital Course (Addendum)
#  Abdominal pain Patient presented with 2 days of burning epigastric pain associated with eating. She was also noted to be on her menstrual cycle at time of admission. CT abdomen/pelvis obtained and was overall benign. No concerns for infectious or obstructive etiology. She was treated with tylenol and protonix for suspected GERD which she reports helped with abdominal pain.    #General malaise and pain Patient presented with 2 days of general malaise and diffuse pain. CK checked to rule out muscle breakdown given report of LE weakness which was minimally elevated, overall not concerning for rapid muscle breakdown. Exam was unremarkable. Pain was treated with tylenol.   #Rash #Hx hydradenitis suppurativa Subjective rash though not able to appreciate this on exam. She verbalized concern for syphillis so EDP ordered RPR which was negative. She denied any vaginal symptoms. Overall exam findings concerning for possible cellulite.   #Substance use disorder Patient admited to tobacco use, ecstasy use, alcohol use. UDS collected and positive for ***. She received nicotine replacement therapy and was counseled on cessation from substance abuse.   #Subclinical hyperthyroidism On admission TSH very low at 0.012, T4 normal at 1.07.  Free T3 is still pending at time of discharge. No concern for thyrotoxicosis, suspect subclinical hyperthyroidism.   #Hx T2DM #Metabolic syndrome Patient with acanthosis nigricans, stretch marks/striae on her abdomen with increased central adiposity, history of T2DM. HbA1c pending at time of discharge. Lipid panel collected and showed elevated triglycerides to 272 and VLDL 54 but normal LDL; this was not a fasted study. Managed with SSI.   #HTN No interventions during admission.   #Abnormal UA UA on admission showed large hemoglobin, small bilirubin, 100 protein, 6-10 WBC, >50 RBC, few bacteria, hyphae. No urinary symptoms. No interventions during admission.

## 2022-07-25 NOTE — H&P (Addendum)
Date: 07/25/2022               Patient Name:  Megan Riddle MRN: [id number]  DOB: [birth year] Age / Sex: 47 y.o., female   PCP: Filomena Jungling, NP         Medical Service: Internal Medicine Teaching Service         Attending Physician: Dr. Mercie Eon, MD    First Contact: Dr. Modena Slater, DO Pager: [phone]  Second Contact: Dr. Champ Mungo, DO Pager: [phone]       After Hours (After 5p/  First Contact Pager: [phone]  weekends / holidays): Second Contact Pager: [phone]   Chief Complaint: generally feeling unwell  History of Present Illness: Megan Riddle is a 47 y.o. F with PMH of breast cancer, arthritis, T2DM, prior DVT of L iliac artery with stent who presents with malaise and generalized weakness.  Patient explains that 2 days ago she noted that she was having soreness over her entire body, regurgitation after meals and stomach pain, and headache. The pain in her stomach is epigastric and comes on with eating. She has had regurgitation 3 times after meals that consisted of what she had eaten. Otherwise she has had no problems with PO intake of food and fluids. She has had food since being at [facility] ED and has tolerated this fine without pain or regurgitation.  She also notes leg soreness that began in this time frame as well. She explains she recently bruised her achilles tendon and is supposed to be wearing a boot for 1 month. Last OV note that I see regarding this issue was in April 2024. Soreness is in the back of both calves and feels like muscle tightness.  She states it feels like she worked out really hard, and feels tight.  She describes it to be from her waist down, but does not report any soreness in her upper body.  She does raise concern about some "spots" all over her body including in her groin area, under her breasts, on her back, on palms, and plantar surfaces of the feet. The spots in her groin itch sometimes. She notes that the rash under her breasts and in  the groin which came on about 1 week ago come on in the summer when she is more hot and sweaty. She is concerned about this same irritation being present in the distribution of her bra straps. She has a history of hydradenitis suppurativa and has been getting boils since she was a teenager.  She denies any recent extreme exercise, hiking, or travel.  She did report that her rectum felt inflamed 2 days ago, but is not too concerned about this anymore.  She is sexually active with 1 partner who is not monogamous. They do not use protection. He told her that he has been tested recently for STDs and is not infected with any STDs.  She herself denies any vaginal discharge, vaginal itching, or any other vaginal symptoms.  She does report she has a rash along her groin that she describes as itchy bumps.  She also endorses fatigue, hair loss, and palpitations.  Of note she did admit to ecstasy use 3 days ago.  ED Course: On presentation patient mildly hypertensive with tachycardia. Notable labs: CMP with Na 133, K 3.0, glucose 207, alk phos 34; TSH 0.012; UA with specific gravity >1.030, large hemoglobin and >50 RBC, small bilirubin, 100 protein, 6-10 WBC, few bacteria, hyphae yeast present; negative UPT. No remarkable  CT head/abdomen/pelvis findings. IMTS called for admission for further evaluation of general malaise.  Meds:  Glipizide 2.5 mg daily (Patient says she takes this only as needed when her sugar is high and hasn't taken it in a long time.) Vitamin B12 Benefiber Tumeric  Allergies: Allergies as of 07/25/2022   (No Known Allergies)   PMH: Depression PTSD "Memory problems" T2DM with peripheral neuropathy Hx dvt  PSH: Past Surgical History:  Procedure Laterality Date   FEMORAL-FEMORAL BYPASS GRAFT Left 03/22/2018   Procedure: LEFT ILIAC ARTERTY EMBOLECTOMY;  Surgeon: Chuck Hint, MD;  Location: [facility] OR;  Service: Vascular;  Laterality: Left;   INTRAOPERATIVE ARTERIOGRAM Left  03/22/2018   Procedure: Intra Operative Arteriogram LEFT LOWER LEG;  Surgeon: Chuck Hint, MD;  Location: [facility] OR;  Service: Vascular;  Laterality: Left;   PATCH ANGIOPLASTY Left 03/22/2018   Procedure: PATCH ANGIOPLASTY OF LEFT FEMORAL ARTERY;  Surgeon: Chuck Hint, MD;  Location: [facility] OR;  Service: Vascular;  Laterality: Left;   Family History:  Mother: Graves disease Sister: Graves disease, MI, palpitations Father: Hepatitis Brother: Healthy  Social History: Lives in a hotel with her nephew who she considers her son. 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 She is independent in ADLs but unable to get information regarding IADLs. Drinks alcohol: ciroc, 1/2 bottle on the weekends but not every weekend; sometimes drinks beer, colt 45. Smokes 1/2 PPD now and started smoking tobacco at age 84. Smokes marijuana twice daily. Used to use cocaine but denies heroin use. Says she used ecstasy 3 days ago.  Review of Systems: A complete ROS was negative except as per HPI.   Physical Exam: Blood pressure (!) 136/99, pulse (!) 103, temperature 98.6 F (37 C), temperature source Oral, resp. rate (!) 25, height 5\' 2"  (1.575 m), weight 93 kg, last menstrual period 12/20/2017, SpO2 100 %. General: Patient is resting comfortably in bed in no acute distress  Eyes: Pupils equal and reactive to light Cardio: Tachycardia,  rhythm, no murmurs, rubs or gallops.  Pulmonary: Clear to ausculation bilaterally with no rales, rhonchi, and crackles  Abdomen: Soft, nontender with normoactive bowel sounds with no rebound or guarding   Back: No rashes appreciated to back Skin: Some papules noted to bilateral groin area, otherwise no rashes appreciated, Acanthosis nigricans  MSK: 5/5 strength to upper and lower extremities.  Extremities: No pitting edema appreciated Psych: Tearful   EKG: personally reviewed my interpretation is sinus tachycardia with HR 110.  CT head: No acute intracranial  findings are seen.   CT abdomen/pelvis: No bowel obstruction, free air or free fluid. Few colonic diverticula. Normal appendix. Fibroid uterus. Surgical changes along the left inguinal region.  Assessment & Plan by Problem: Principal Problem:   Abdominal pain  #Abdominal pain Patient is on her monthly menstrual cycle so could have pain related to this. No imaging findings on CT abdomen/pelvis to indicate gastroenteritis, cholecystitis or appendicitis, or more emergent intraabdominal pathologies such as bowel perforation or free fluid in the bowel. Symptoms of pain after eating only and some regurgitation concerning for GERD.  -Start protonix 40 mg BID for 8 weeks; follow-up with PCP on need to continue this medication based on improvement of symptoms -Tylenol 650 mg q6h PRN pain  #General malaise and pain Does have history of lumbar radiculopathy, diabetic neuropathy, and L iliac artery DVT.  She does report having generalized weakness and pain noted to her bilateral lower extremities.  She denies any history  of trauma to the area.  She did state this happened about 2 days ago.  I do wonder if this is related to her most recent use of ecstasy.  Will evaluate with CK, but I do not think this is related to rhabdo as no granular casts or any autoimmune disease such as dermatomyositis or myositis. -PT/OT to evaluate -CK pending  -Pain control with Tylenol  #Rash #Hx hydradenitis suppurativa Patient expresses concerns that she has syphilis. She is sexually active with one female partner who she says has not been monogamous. She denies vaginal discomfort, discharge, abnormal odor, itch. She explains her concerns about syphilis are because of subjective palmar and plantar rash not appreciated on exam and a family member's experience with having syphilis. Denies recent hikes and do not suspect exposure to biting insects including bed bugs. No actively inflamed areas or drainage. -RPR ordered by EDP, will  follow up  #Substance use disorder Patient admits to tobacco use, ecstasy use, tobacco use, alcohol use. She reports seeing a rash all over her body but this is not consistent with my exam. Unclear if she is withdrawing or currently under the influence.  Patient does report using cigarettes daily since she was 47 years old. -UDS -Nicotine replacement therapy -Encourage cessation of all substances -TOC consult for substance abuse -CIWA scoring   #Subclinical hyperthyroidism Family history of Graves Disease in mother and sister. Endorses fatigue, hair loss, and palpitations. TSH very low at 0.012, T4 normal at 1.07.  Free T3 is still pending.  Will continue to monitor for further symptoms.  Do not anticipate patient is in thyroid storm given T4 at normal levels. -Recommend repeat studies as outpatient  #Hx T2DM #Metabolic syndrome Prescribed glipizide outpatient but states that she doesn't remember when she last took this medication because when she checks her blood sugar at home, the levels are normal. Last HbA1c 7.6. She does have acanthosis nigricans, stretch marks/striae on her abdomen with increased central adiposity. -Recheck HbA1c -Check lipid panel -CBGs per unit, SSI  #HTN Mildly hypertensive. No outpatient therapies. -Trend BP, hold off on treatment at this time.  #Abnormal UA Patient did show to have hemoglobin in UA, but patient did admit to being on her menstrual cycle so likely is the reason.  Patient also found to have some yeast in her urine.  This is likely colonization.  Would not treat given asymptomatic.  Dispo: Admit patient to Inpatient with expected length of stay greater than 2 midnights.  SignedModena Slater, DO 07/25/2022, 6:09 PM  After 5pm on weekdays and 1pm on weekends: On Call pager: [phone]

## 2022-07-25 NOTE — ED Triage Notes (Signed)
Pt bib ems from home; c/o dizziness and HA , vomiting since yesterday; also endorses skin color changes on palms of hands and feet; also c/o pain in bilateral legs, groin, and back; hx DVT; not on thinners; BP 190/102, cbg 240, hx dm, HTN; HR initially 130, now 115; 12 lead sinus tach

## 2022-07-25 NOTE — ED Provider Notes (Signed)
[facility] DEPARTMENT AT [facility] Provider Note   CSN: [id number] Arrival date & time: 07/25/22  1123     History  Chief Complaint  Patient presents with   Dizziness   Leg Pain    Megan Riddle is a 47 y.o. female.   Dizziness Leg Pain Patient again since various complaints.  Headache dizziness vomiting abdominal pain.  States pain in her legs and groin.  States there is a rash in her groin.  States that she also has darkened spots on her hands feet and arms and legs.  Feels bad all over.  States has neuropathy and that feels different.  States both her feet hurt.  No fevers.  No coughing.  Also has had left-sided lower abdominal pain.  States severe.  Also patient states she is on her menses.  States she is a few days early.    Past Medical History:  Diagnosis Date   Arthritis    Cancer (HCC)    breast   Diabetes mellitus without complication (HCC)    DVT (deep venous thrombosis) (HCC)     Home Medications Prior to Admission medications   Medication Sig Start Date End Date Taking? Authorizing Provider  Cyanocobalamin (VITAMIN B 12 PO) Take 1 tablet by mouth daily.    [provider]  fexofenadine (ALLEGRA) 180 MG tablet Take 180 mg by mouth daily.    [provider]  gabapentin (NEURONTIN) 300 MG capsule Take by mouth. 04/03/22   [provider]  glipiZIDE (GLUCOTROL) 5 MG tablet Take 5 mg by mouth 2 (two) times daily.    [provider]  lisinopril (PRINIVIL,ZESTRIL) 2.5 MG tablet Take 2.5 mg by mouth daily as needed (high blood pressure).  12/04/17   [provider]  meloxicam (MOBIC) 15 MG tablet Take 1 tablet (15 mg total) by mouth daily. 05/28/22   Hyatt, Max T, DPM  metFORMIN (GLUCOPHAGE) 500 MG tablet Take 2 tablets (1,000 mg total) by mouth 2 (two) times daily. Patient taking differently: Take 1,000 mg by mouth 2 (two) times daily as needed (low blood sugar). 12/19/15   Antony Madura, PA-C   Multiple Vitamin (MULTIVITAMIN WITH MINERALS) TABS tablet Take 1 tablet by mouth daily.    [provider]  polyethylene glycol (MIRALAX) 17 g packet Take 17 g by mouth 2 (two) times daily. 10/16/20   Madelyn Brunner, DO  senna-docusate (SENOKOT-S) 8.6-50 MG tablet Take 1 tablet by mouth 2 (two) times daily. 10/16/20   Madelyn Brunner, DO  TURMERIC PO Take 1 tablet by mouth daily.    [provider]  Vitamins/Minerals TABS Take by mouth.    [provider]  VRAYLAR 1.5 MG capsule Take 1.5 mg by mouth daily. 04/25/22   [provider]      Allergies    Patient has no known allergies.    Review of Systems   Review of Systems  Neurological:  Positive for dizziness.    Physical Exam Updated Vital Signs BP (!) 130/105   Pulse (!) 103   Temp 98.9 F (37.2 C) (Oral)   Resp (!) 23   Ht 5\' 2"  (1.575 m)   Wt 93 kg   LMP 12/20/2017 (Approximate) Comment: " I don't even remember".   SpO2 100%   BMI 37.50 kg/m  Physical Exam Vitals and nursing note reviewed.  HENT:     Head: Atraumatic.  Eyes:     Pupils: Pupils are equal, round, and reactive to light.  Cardiovascular:     Rate and Rhythm: Regular rhythm.  Abdominal:     Tenderness: There is abdominal tenderness.     Comments: Left lower quadrant tenderness.  No mass.  Genitourinary:    Comments: Some mild erythema in her leg folds in her perineal area.  No frank induration but mild erythema.  No abscess palpated. Skin:    Capillary Refill: Capillary refill takes less than 2 seconds.     Comments: Does have some potential yeast like infection below bilateral breasts.  Neurological:     Mental Status: She is alert.   Few small hyperpigmented areas on hands.  Potentially few areas on feet also.  ED Results / Procedures / Treatments   Labs (all labs ordered are listed, but only abnormal results are displayed) Labs Reviewed  COMPREHENSIVE METABOLIC PANEL - Abnormal; Notable for the following  components:      Result Value   Sodium 133 (*)    Potassium 3.0 (*)    Glucose, Bld 207 (*)    Alkaline Phosphatase 34 (*)    All other components within normal limits  URINALYSIS, W/ REFLEX TO CULTURE (INFECTION SUSPECTED) - Abnormal; Notable for the following components:   Color, Urine AMBER (*)    APPearance CLOUDY (*)    Specific Gravity, Urine >1.030 (*)    Hgb urine dipstick LARGE (*)    Bilirubin Urine SMALL (*)    Protein, ur 100 (*)    Bacteria, UA FEW (*)    All other components within normal limits  TSH - Abnormal; Notable for the following components:   TSH 0.012 (*)    All other components within normal limits  CBC WITH DIFFERENTIAL/PLATELET  HIV ANTIBODY (ROUTINE TESTING W REFLEX)  PREGNANCY, URINE  RPR  T3, FREE  T4, FREE  POC URINE PREG, ED    EKG None  Radiology CT ABDOMEN PELVIS W CONTRAST  Result Date: 07/25/2022 CLINICAL DATA:  Left lower quadrant abdominal pain EXAM: CT ABDOMEN AND PELVIS WITH CONTRAST TECHNIQUE: Multidetector CT imaging of the abdomen and pelvis was performed using the standard protocol following bolus administration of intravenous contrast. RADIATION DOSE REDUCTION: This exam was performed according to the departmental dose-optimization program which includes automated exposure control, adjustment of the mA and/or kV according to patient size and/or use of iterative reconstruction technique. CONTRAST:  75mL OMNIPAQUE IOHEXOL 350 MG/ML SOLN COMPARISON:  CT angiogram 10/26/2018 FINDINGS: Lower chest: There is some breathing motion at the lung bases. No pleural effusion. Hepatobiliary: No focal liver abnormality is seen. No gallstones, gallbladder wall thickening, or biliary dilatation. Pancreas: Unremarkable. No pancreatic ductal dilatation or surrounding inflammatory changes. Spleen: Normal in size without focal abnormality. Adrenals/Urinary Tract: Minimal thickening of the left adrenal gland is stable. The right adrenal gland is preserved. No  enhancing renal mass or collecting system dilatation. Tiny low-attenuation lesion seen in the left kidney are too small to completely characterize but likely benign cysts. No specific imaging follow-up of these Bosniak 2 lesions. The ureters have normal course and caliber extending down to the bladder. Bladder is underdistended. Stomach/Bowel: No oral contrast. The colon has a normal course and caliber with scattered stool. Left-sided colonic diverticula. There is a normal appendix in the right hemipelvis. Small bowel is nondilated. Vascular/Lymphatic: Aortic atherosclerosis. No enlarged abdominal or pelvic lymph nodes. 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 Reproductive: Lobular uterus consistent with multiple fibroids. There is some component of dystrophic calcification along the larger left-sided fibroid. Other:  Thickening and clips along the left inguinal region. No free air or free fluid. Musculoskeletal: Mild degenerative changes seen of the spine and pelvis. IMPRESSION: No bowel obstruction, free air or free fluid. Few colonic diverticula. Normal appendix. Fibroid uterus. Surgical changes along the left inguinal region. Electronically Signed   By: Karen Kays M.D.   On: 07/25/2022 15:18   CT HEAD WO CONTRAST ( )  Result Date: 07/25/2022 CLINICAL DATA:  Headaches with increasing frequency and severity, dizziness EXAM: CT HEAD WITHOUT CONTRAST TECHNIQUE: Contiguous axial images were obtained from the base of the skull through the vertex without intravenous contrast. RADIATION DOSE REDUCTION: This exam was performed according to the departmental dose-optimization program which includes automated exposure control, adjustment of the mA and/or kV according to patient size and/or use of iterative reconstruction technique. COMPARISON:  07/10/2016 FINDINGS: Brain: No acute intracranial findings are seen. There are no signs of bleeding within the cranium. Ventricles are not dilated.  Cortical sulci are prominent. Vascular: Unremarkable. Skull: Unremarkable. Sinuses/Orbits: Unremarkable. Other: No significant interval changes are noted. IMPRESSION: No acute intracranial findings are seen. Electronically Signed   By: Ernie Avena M.D.   On: 07/25/2022 15:16    Procedures Procedures    Medications Ordered in ED Medications  iohexol (OMNIPAQUE) 350 MG/ML injection 75 mL (75 mLs Intravenous Contrast Given 07/25/22 1451)    ED Course/ Medical Decision Making/ A&P                             Medical Decision Making Amount and/or Complexity of Data Reviewed Labs: ordered. Radiology: ordered.  Risk Prescription drug management.   Patient with various complaints.  Rash nausea vomiting headache nausea abdominal pain.  Appears somewhat pressure.  States she just feels bad.  Differential diagnoses long and includes UTI, as cellulitis, diverticulitis.  With skin rash also includes causes such as syphilis.  White count reassuring, however glucose of 200 and potassium of 3.  Also TSH is very low.  Urinalysis shows potential shin with 6-10 white cells few bacteria.  Although also has blood and could be somewhat from menses.  No squamous cells.  CT scan of head reassuring.  CT scan abdomen pelvis reassuring.  TSH is very low which would be consistent with hyperthyroidism.  T3 and T4 still pending.  However patient is hypertensive.   Patient is been feeling weak.  Has had near syncope.  With the whole constellation of symptoms including not able to eat and drink I feel she benefit from more expeditious workup and admission to hospital.  Will discuss with unassigned medicine.  With further discussion with patient states both mother and grandmother have had Graves' disease.       Final Clinical Impression(s) / ED Diagnoses Final diagnoses:  Nausea and vomiting, unspecified vomiting type  Dizziness  Hyperthyroidism    Rx / DC Orders ED Discharge Orders     None          Benjiman Core, MD 07/25/22 1542

## 2022-07-25 NOTE — ED Notes (Signed)
ED TO INPATIENT HANDOFF REPORT  ED Nurse Name and Phone #: Vernona Rieger [phone]  S Name/Age/Gender Megan Riddle Hines-Mister 47 y.o. female Room/Bed: 029C/029C  Code Status   Code Status: Full Code  Home/SNF/Other Home Patient oriented to: self, place, time, and situation Is this baseline? Yes   Triage Complete: Triage complete  Chief Complaint Abdominal pain [R10.9]  Triage Note Pt bib ems from home; c/o dizziness and HA , vomiting since yesterday; also endorses skin color changes on palms of hands and feet; also c/o pain in bilateral legs, groin, and back; hx DVT; not on thinners; BP 190/102, cbg 240, hx dm, HTN; HR initially 130, now 115; 12 lead sinus tach   Allergies No Known Allergies  Level of Care/Admitting Diagnosis ED Disposition     ED Disposition  Admit   Condition  --   Comment  Hospital Area: [facility] [[id number]]  Level of Care: Med-Surg [16]  May place patient in observation at [facility] if equivalent level of care is available:: No  Covid Evaluation: Asymptomatic - no recent exposure (last 10 days) testing not required  Diagnosis: Abdominal pain [960454]  Admitting Physician: Mercie Eon [[id number]]  Attending Physician: Mercie Eon [[id number]]          B Medical/Surgery History Past Medical History:  Diagnosis Date   Arthritis    Cancer (HCC)    breast   Diabetes mellitus without complication (HCC)    DVT (deep venous thrombosis) (HCC)    Past Surgical History:  Procedure Laterality Date   FEMORAL-FEMORAL BYPASS GRAFT Left 03/22/2018   Procedure: LEFT ILIAC ARTERTY EMBOLECTOMY;  Surgeon: Chuck Hint, MD;  Location: [facility] OR;  Service: Vascular;  Laterality: Left;   INTRAOPERATIVE ARTERIOGRAM Left 03/22/2018   Procedure: Intra Operative Arteriogram LEFT LOWER LEG;  Surgeon: Chuck Hint, MD;  Location: [facility] OR;  Service: Vascular;  Laterality: Left;   PATCH ANGIOPLASTY Left 03/22/2018   Procedure: PATCH  ANGIOPLASTY OF LEFT FEMORAL ARTERY;  Surgeon: Chuck Hint, MD;  Location: [facility] OR;  Service: Vascular;  Laterality: Left;     A IV Location/Drains/Wounds Patient Lines/Drains/Airways Status     Active Line/Drains/Airways     Name Placement date Placement time Site Days   Peripheral IV 07/25/22 20 G Left;Posterior Forearm 07/25/22  1230  Forearm  less than 1            Intake/Output Last 24 hours No intake or output data in the 24 hours ending 07/25/22 1737  Labs/Imaging Results for orders placed or performed during the hospital encounter of 07/25/22 (from the past 48 hour(s))  Comprehensive metabolic panel     Status: Abnormal   Collection Time: 07/25/22 12:33 PM  Result Value Ref Range   Sodium 133 (L) 135 - 145 mmol/L   Potassium 3.0 (L) 3.5 - 5.1 mmol/L   Chloride 99 98 - 111 mmol/L   CO2 23 22 - 32 mmol/L   Glucose, Bld 207 (H) 70 - 99 mg/dL    Comment: Glucose reference range applies only to samples taken after fasting for at least 8 hours.   BUN 12 6 - 20 mg/dL   Creatinine, Ser 2.13 0.44 - 1.00 mg/dL   Calcium 9.7 8.9 - 08.6 mg/dL   Total Protein 7.4 6.5 - 8.1 g/dL   Albumin 4.0 3.5 - 5.0 g/dL   AST 26 15 - 41 U/L   ALT 34 0 - 44 U/L   Alkaline Phosphatase 34 (L) 38 -  126 U/L   Total Bilirubin 1.1 0.3 - 1.2 mg/dL   GFR, Estimated >16 >10 mL/min    Comment: (NOTE) Calculated using the CKD-EPI Creatinine Equation (2021)    Anion gap 11 5 - 15    Comment: Performed at [facility], [address] [place], [place] [address]  CBC with Differential     Status: None   Collection Time: 07/25/22 12:33 PM  Result Value Ref Range   WBC 10.3 4.0 - 10.5 K/uL   RBC 3.99 3.87 - 5.11 MIL/uL   Hemoglobin 13.2 12.0 - 15.0 g/dL   HCT 40.9 81.1 - 91.4 %   MCV 97.2 80.0 - 100.0 fL   MCH 33.1 26.0 - 34.0 pg   MCHC 34.0 30.0 - 36.0 g/dL   RDW 78.2 95.6 - 21.3 %   Platelets 243 150 - 400 K/uL   nRBC 0.0 0.0 - 0.2 %   Neutrophils Relative % 56 %   Neutro Abs  5.8 1.7 - 7.7 K/uL   Lymphocytes Relative 34 %   Lymphs Abs 3.5 0.7 - 4.0 K/uL   Monocytes Relative 7 %   Monocytes Absolute 0.7 0.1 - 1.0 K/uL   Eosinophils Relative 2 %   Eosinophils Absolute 0.2 0.0 - 0.5 K/uL   Basophils Relative 1 %   Basophils Absolute 0.1 0.0 - 0.1 K/uL   Immature Granulocytes 0 %   Abs Immature Granulocytes 0.03 0.00 - 0.07 K/uL    Comment: Performed at [facility], [address] [place], [place] [address]  TSH     Status: Abnormal   Collection Time: 07/25/22 12:33 PM  Result Value Ref Range   TSH 0.012 (L) 0.350 - 4.500 uIU/mL    Comment: Performed by a 3rd Generation assay with a functional sensitivity of <=0.01 uIU/mL. Performed at [facility], [address] [place], [place] [address]   HIV Antibody (routine testing w rflx)     Status: None   Collection Time: 07/25/22 12:33 PM  Result Value Ref Range   HIV Screen 4th Generation wRfx Non Reactive Non Reactive    Comment: Performed at [facility], [address] [place], [place] [address]  Urinalysis, w/ Reflex to Culture (Infection Suspected) -Urine, Unspecified Source     Status: Abnormal   Collection Time: 07/25/22  2:00 PM  Result Value Ref Range   Specimen Source URINE, UNSPE    Color, Urine AMBER (A) YELLOW    Comment: BIOCHEMICALS MAY BE AFFECTED BY COLOR   APPearance CLOUDY (A) CLEAR   Specific Gravity, Urine >1.030 (H) 1.005 - 1.030   pH 6.0 5.0 - 8.0   Glucose, UA NEGATIVE NEGATIVE mg/dL   Hgb urine dipstick LARGE (A) NEGATIVE   Bilirubin Urine SMALL (A) NEGATIVE   Ketones, ur NEGATIVE NEGATIVE mg/dL   Protein, ur 413 (A) NEGATIVE mg/dL   Nitrite NEGATIVE NEGATIVE   Leukocytes,Ua NEGATIVE NEGATIVE   Squamous Epithelial / HPF 0-5 0 - 5 /HPF   WBC, UA 6-10 0 - 5 WBC/hpf    Comment: Reflex urine culture not performed if WBC <=10, OR if Squamous epithelial cells >5. If Squamous epithelial cells >5, suggest recollection.   RBC / HPF >50 0 - 5 RBC/hpf   Bacteria, UA  FEW (A) NONE SEEN   Mucus PRESENT    Hyphae Yeast PRESENT     Comment: Performed at [facility], [address], [place], [place] [address]  Pregnancy, urine  Status: None   Collection Time: 07/25/22  2:00 PM  Result Value Ref Range   Preg Test, Ur NEGATIVE NEGATIVE    Comment:        THE SENSITIVITY OF THIS METHODOLOGY IS >25 mIU/mL. Performed at [facility], [address], [place], [place] [address]   T4, free     Status: None   Collection Time: 07/25/22  3:37 PM  Result Value Ref Range   Free T4 1.07 0.61 - 1.12 ng/dL    Comment: (NOTE) Biotin ingestion may interfere with free T4 tests. If the results are inconsistent with the TSH level, previous test results, or the clinical presentation, then consider biotin interference. If needed, order repeat testing after stopping biotin. Performed at [facility], [address], [place], [place] [address]    CT ABDOMEN PELVIS W CONTRAST  Result Date: 07/25/2022 CLINICAL DATA:  Left lower quadrant abdominal pain EXAM: CT ABDOMEN AND PELVIS WITH CONTRAST TECHNIQUE: Multidetector CT imaging of the abdomen and pelvis was performed using the standard protocol following bolus administration of intravenous contrast. RADIATION DOSE REDUCTION: This exam was performed according to the departmental dose-optimization program which includes automated exposure control, adjustment of the mA and/or kV according to patient size and/or use of iterative reconstruction technique. CONTRAST:  75mL OMNIPAQUE IOHEXOL 350 MG/ML SOLN COMPARISON:  CT angiogram 10/26/2018 FINDINGS: Lower chest: There is some breathing motion at the lung bases. No pleural effusion. Hepatobiliary: No focal liver abnormality is seen. No gallstones, gallbladder wall thickening, or biliary dilatation. Pancreas: Unremarkable. No pancreatic ductal dilatation or surrounding inflammatory changes. Spleen: Normal in size without focal abnormality. Adrenals/Urinary Tract:  Minimal thickening of the left adrenal gland is stable. The right adrenal gland is preserved. No enhancing renal mass or collecting system dilatation. Tiny low-attenuation lesion seen in the left kidney are too small to completely characterize but likely benign cysts. No specific imaging follow-up of these Bosniak 2 lesions. The ureters have normal course and caliber extending down to the bladder. Bladder is underdistended. Stomach/Bowel: No oral contrast. The colon has a normal course and caliber with scattered stool. Left-sided colonic diverticula. There is a normal appendix in the right hemipelvis. Small bowel is nondilated. Vascular/Lymphatic: Aortic atherosclerosis. No enlarged abdominal or pelvic lymph nodes. 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 Reproductive: Lobular uterus consistent with multiple fibroids. There is some component of dystrophic calcification along the larger left-sided fibroid. Other: Thickening and clips along the left inguinal region. No free air or free fluid. Musculoskeletal: Mild degenerative changes seen of the spine and pelvis. IMPRESSION: No bowel obstruction, free air or free fluid. Few colonic diverticula. Normal appendix. Fibroid uterus. Surgical changes along the left inguinal region. Electronically Signed   By: Karen Kays M.D.   On: 07/25/2022 15:18   CT HEAD WO CONTRAST ( )  Result Date: 07/25/2022 CLINICAL DATA:  Headaches with increasing frequency and severity, dizziness EXAM: CT HEAD WITHOUT CONTRAST TECHNIQUE: Contiguous axial images were obtained from the base of the skull through the vertex without intravenous contrast. RADIATION DOSE REDUCTION: This exam was performed according to the departmental dose-optimization program which includes automated exposure control, adjustment of the mA and/or kV according to patient size and/or use of iterative reconstruction technique. COMPARISON:  07/10/2016 FINDINGS: Brain: No acute intracranial  findings are seen. There are no signs of bleeding within the cranium. Ventricles are not dilated. Cortical sulci are prominent. Vascular: Unremarkable. Skull: Unremarkable. Sinuses/Orbits: Unremarkable. Other: No significant interval  changes are noted. IMPRESSION: No acute intracranial findings are seen. Electronically Signed   By: Ernie Avena M.D.   On: 07/25/2022 15:16    Pending Labs Unresulted Labs (From admission, onward)     Start     Ordered   07/26/22 0500  Hemoglobin A1c  Tomorrow morning,   R        07/25/22 1725   07/26/22 0500  Magnesium  Tomorrow morning,   R        07/25/22 1726   07/26/22 0500  Basic metabolic panel  Tomorrow morning,   R        07/25/22 1728   07/26/22 0500  Lipid panel  Tomorrow morning,   R        07/25/22 1728   07/25/22 1729  Rapid urine drug screen (hospital performed)  Once,   R        07/25/22 1728   07/25/22 1424  T3, free  Once,   URGENT        07/25/22 1423   07/25/22 1213  RPR  Once,   URGENT        07/25/22 1214            Vitals/Pain Today's Vitals   07/25/22 1630 07/25/22 1645 07/25/22 1700 07/25/22 1731  BP: 98/70 108/78 (!) 136/99   Pulse: (!) 102 91 (!) 103   Resp: 15 14 (!) 25   Temp:    98.6 F (37 C)  TempSrc:    Oral  SpO2: 100% 100% 100%   Weight:      Height:      PainSc:        Isolation Precautions No active isolations  Medications Medications  enoxaparin (LOVENOX) injection 40 mg (has no administration in time range)  acetaminophen (TYLENOL) tablet 650 mg (has no administration in time range)    Or  acetaminophen (TYLENOL) suppository 650 mg (has no administration in time range)  polyethylene glycol (MIRALAX / GLYCOLAX) packet 17 g (has no administration in time range)  ondansetron (ZOFRAN) tablet 4 mg (has no administration in time range)    Or  ondansetron (ZOFRAN) injection 4 mg (has no administration in time range)  pantoprazole (PROTONIX) EC tablet 40 mg (has no administration in time range)   potassium chloride SA (KLOR-CON M) CR tablet 40 mEq (has no administration in time range)  insulin aspart (novoLOG) injection 0-9 Units (has no administration in time range)  insulin aspart (novoLOG) injection 0-5 Units (has no administration in time range)  hydrOXYzine (ATARAX) tablet 10 mg (has no administration in time range)  iohexol (OMNIPAQUE) 350 MG/ML injection 75 mL (75 mLs Intravenous Contrast Given 07/25/22 1451)    Mobility walks with device     Focused Assessments Abd pain-dizziness at times   R Recommendations: See Admitting Provider Note  Report given to:   Additional Notes: Ambulates safely with a cane. Had a previous injury to R leg. Continent.

## 2022-07-26 ENCOUNTER — Other Ambulatory Visit (HOSPITAL_COMMUNITY): Payer: Self-pay

## 2022-07-26 DIAGNOSIS — R1084 Generalized abdominal pain: Secondary | ICD-10-CM | POA: Diagnosis not present

## 2022-07-26 DIAGNOSIS — R112 Nausea with vomiting, unspecified: Secondary | ICD-10-CM | POA: Diagnosis not present

## 2022-07-26 LAB — BASIC METABOLIC PANEL
Anion gap: 9 (ref 5–15)
BUN: 17 mg/dL (ref 6–20)
CO2: 27 mmol/L (ref 22–32)
Calcium: 9.1 mg/dL (ref 8.9–10.3)
Chloride: 98 mmol/L (ref 98–111)
Creatinine, Ser: 0.75 mg/dL (ref 0.44–1.00)
GFR, Estimated: >60 mL/min (ref >60)
Glucose, Bld: 211 mg/dL — ABNORMAL HIGH (ref 70–99)
Potassium: 3.2 mmol/L — ABNORMAL LOW (ref 3.5–5.1)
Sodium: 134 mmol/L — ABNORMAL LOW (ref 135–145)

## 2022-07-26 LAB — RAPID URINE DRUG SCREEN, HOSP PERFORMED
Amphetamines: POSITIVE — AB
Barbiturates: NOT DETECTED
Benzodiazepines: NOT DETECTED
Cocaine: POSITIVE — AB
Opiates: NOT DETECTED
Tetrahydrocannabinol: POSITIVE — AB

## 2022-07-26 LAB — LIPID PANEL
Cholesterol: 118 mg/dL (ref 0–200)
HDL: 32 mg/dL — ABNORMAL LOW (ref >40)
LDL Cholesterol: 32 mg/dL (ref 0–99)
Total CHOL/HDL Ratio: 3.7 RATIO
Triglycerides: 272 mg/dL — ABNORMAL HIGH (ref <150)
VLDL: 54 mg/dL — ABNORMAL HIGH (ref 0–40)

## 2022-07-26 LAB — T3, FREE: T3, Free: 4.9 pg/mL — ABNORMAL HIGH (ref 2.0–4.4)

## 2022-07-26 LAB — MAGNESIUM: Magnesium: 1.6 mg/dL — ABNORMAL LOW (ref 1.7–2.4)

## 2022-07-26 LAB — GLUCOSE, CAPILLARY
Glucose-Capillary: 169 mg/dL — ABNORMAL HIGH (ref 70–99)
Glucose-Capillary: 207 mg/dL — ABNORMAL HIGH (ref 70–99)

## 2022-07-26 LAB — RPR: RPR Ser Ql: NONREACTIVE

## 2022-07-26 MED ORDER — POTASSIUM CHLORIDE CRYS ER 20 MEQ PO TBCR: 40.0000 meq | EXTENDED_RELEASE_TABLET | Freq: Once | ORAL | Status: DC

## 2022-07-26 MED ORDER — PANTOPRAZOLE SODIUM 40 MG PO TBEC
40.0000 mg | DELAYED_RELEASE_TABLET | Freq: Every day | ORAL | 0 refills | Status: AC
  Filled 2022-07-26: qty 30, 30d supply, fill #0

## 2022-07-26 MED ORDER — MAGNESIUM SULFATE 2 GM/50ML IV SOLN
2.0000 g | Freq: Once | INTRAVENOUS | Status: AC
  Administered 2022-07-26: 2 g via INTRAVENOUS
  Filled 2022-07-26: qty 50

## 2022-07-26 NOTE — Discharge Summary (Signed)
Name: Megan Riddle MRN: [id number] DOB: 01/08/1976 47 y.o. PCP: Megan Jungling, NP  Date of Admission: 07/25/2022 11:23 AM Date of Discharge:  07/26/2022 Attending Physician: Dr.  Lafonda Mosses  DISCHARGE DIAGNOSIS:  Primary Problem: Abdominal pain   Hospital Problems: Principal Problem:   Abdominal pain    DISCHARGE MEDICATIONS:   Allergies as of 07/26/2022   No Known Allergies      Medication List     STOP taking these medications    DULoxetine 30 MG capsule Commonly known as: CYMBALTA   gabapentin 300 MG capsule Commonly known as: NEURONTIN   lisinopril 2.5 MG tablet Commonly known as: ZESTRIL   meloxicam 15 MG tablet Commonly known as: MOBIC   metFORMIN 500 MG tablet Commonly known as: GLUCOPHAGE   multivitamin with minerals Tabs tablet   polyethylene glycol 17 g packet Commonly known as: MiraLax   senna-docusate 8.6-50 MG tablet Commonly known as: Senokot-S   Vraylar 3 MG capsule Generic drug: cariprazine       TAKE these medications    glipiZIDE 5 MG tablet Commonly known as: GLUCOTROL Take 5 mg by mouth 2 (two) times daily.   pantoprazole 40 MG tablet Commonly known as: PROTONIX Take 1 tablet (40 mg total) by mouth daily. Start taking on: July 27, 2022   TURMERIC PO Take 1 tablet by mouth daily.   VITAMIN B 12 PO Take 1 tablet by mouth daily.        DISPOSITION AND FOLLOW-UP:  Megan Riddle was discharged from [facility] in Stable condition. At the hospital follow up visit please address:  Abdominal pain Continue protonix if patient noticing improvement of prandial pain.   Rash Consider referral to dermatology for patient's skin concerns.   Substance use disorder Continue to encourage cessation from all substances.   Subclinical hyperthyroidism Recheck thyroid studies in 4-6 weeks and treat thyroid disease if indicated.   Hx T2DM Metabolic syndrome Recheck lipid panel and follow up on  HbA1c. Recommend counseling on correct use of glipizide as patient explains she is only taking the medication PRN. Consider starting fenofibrate if indicated.   HTN Initiate anti-hypertensive therapy if indicated.   Abnormal UA Recheck UA once patient is no longer on her menses.  Follow-up Recommendations: Consults: None Labs: Cholesterol, Hgb A1C, T4, TSH, and Urinalysis Studies: None Medications:   New medications: Protonix 40 mg daily  No other medication changes made at discharge.  Follow-up Appointments:  Follow-up Information     Megan Jungling, NP Follow up.   Specialty: Nurse Practitioner Why: Make an appointment to follow up with your PCP in the next 1-2 weeks. Contact information: [address] [place] [address] [phone]                 HOSPITAL COURSE:  Patient Summary: #Abdominal pain Patient presented with 2 days of burning epigastric pain associated with eating. She was also noted to be on her menstrual cycle at time of admission. CT abdomen/pelvis obtained and was overall benign. No concerns for infectious or obstructive etiology. She was treated with tylenol and protonix for suspected GERD which she reports helped with abdominal pain.    #General malaise and pain Patient presented with 2 days of general malaise and diffuse pain. CK checked to rule out muscle breakdown given report of LE weakness which was minimally elevated, overall not concerning for rapid muscle breakdown. Exam was unremarkable. Pain was treated with tylenol.   #Rash #Hx hydradenitis suppurativa Subjective  rash though not able to appreciate this on exam. She verbalized concern for syphillis so EDP ordered RPR which was negative. She denied any vaginal symptoms. Overall exam findings concerning for possible cellulite.   #Substance use disorder Patient admited to tobacco use, ecstasy use, alcohol use. UDS collected and positive for cocaine, amphetamines, THC.  She received nicotine replacement therapy and was counseled on cessation from substance abuse.  I do suspect patient was under influence during my initial encounter with her.   #Subclinical hyperthyroidism On admission TSH very low at 0.012, T4 normal at 1.07.  Free T3 is still pending at time of discharge. No concern for thyrotoxicosis, suspect subclinical hyperthyroidism.   #Hx T2DM #Metabolic syndrome Patient with acanthosis nigricans, stretch marks/striae on her abdomen with increased central adiposity, history of T2DM. HbA1c pending at time of discharge. Lipid panel collected and showed elevated triglycerides to 272 and VLDL 54 but normal LDL; this was not a fasted study. Managed with SSI.   #HTN No interventions during admission.   #Abnormal UA UA on admission showed large hemoglobin, small bilirubin, 100 protein, 6-10 WBC, >50 RBC, few bacteria, hyphae. No urinary symptoms. No interventions during admission.   DISCHARGE INSTRUCTIONS:   Discharge Instructions     Discharge instructions   Complete by: As directed    [name],  It was a pleasure to care for you during your stay at [facility]. I am glad you are feeling better!  When you go home you will have one new medication called protonix. You will take this once daily. There are no other medication changes.  Please call your primary care doctor and schedule a follow up appointment in the next 1-2 weeks.  My best, Dr. Allena Katz   Increase activity slowly   Complete by: As directed        SUBJECTIVE:   Patient reports that she is doing well. She does report that she has had skin changes to her legs. She reports that she is still having some leg soreness but otherwise reports that she has been getting around with no concerns. She reports that she has been eating a drinking well with no concerns. She is happy to be going home today.  Discharge Vitals:   BP (!) 153/92 (BP Location: Left Arm)   Pulse 83   Temp 98.5  F (36.9 C)   Resp 18   Ht 5\' 2"  (1.575 m)   Wt 93 kg   LMP 12/20/2017 (Approximate) Comment: " I don't even remember".   SpO2 98%   BMI 37.50 kg/m   OBJECTIVE:  Physical Exam  General: Patient is resting comfortably in bed in no acute distress  Head: Normocephalic, atraumatic  Cardio: Regular rate and rhythm, no murmurs, rubs or gallops Pulmonary: Clear to ausculation bilaterally with no rales, rhonchi, and crackles  Abdomen: Soft, nontender with normoactive bowel sounds with no rebound or guarding  MSK: 5/5 strength to lower extremities on hip flexion, hip extension, plantarflexion and dorsiflexion.  Skin: some hypopigmented spots noted to bilateral lower extremities   Pertinent Labs, Studies, and Procedures:     Latest Ref Rng & Units 07/25/2022   12:33 PM 10/18/2021   11:28 AM 05/11/2021   11:33 AM  CBC  WBC 4.0 - 10.5 K/uL 10.3  7.5  10.5   Hemoglobin 12.0 - 15.0 g/dL 16.1  09.6  04.5   Hematocrit 36.0 - 46.0 % 38.8  37.0  39.2   Platelets 150 - 400 K/uL 243  242  226        Latest Ref Rng & Units 07/26/2022   12:33 AM 07/25/2022   12:33 PM 10/18/2021   11:28 AM  CMP  Glucose 70 - 99 mg/dL 308  657  846   BUN 6 - 20 mg/dL 17  12  11    Creatinine 0.44 - 1.00 mg/dL 9.62  9.52  8.41   Sodium 135 - 145 mmol/L 134  133  138   Potassium 3.5 - 5.1 mmol/L 3.2  3.0  3.6   Chloride 98 - 111 mmol/L 98  99  106   CO2 22 - 32 mmol/L 27  23  24    Calcium 8.9 - 10.3 mg/dL 9.1  9.7  9.7   Total Protein 6.5 - 8.1 g/dL  7.4    Total Bilirubin 0.3 - 1.2 mg/dL  1.1    Alkaline Phos 38 - 126 U/L  34    AST 15 - 41 U/L  26    ALT 0 - 44 U/L  34      CT ABDOMEN PELVIS W CONTRAST  Result Date: 07/25/2022 CLINICAL DATA:  Left lower quadrant abdominal pain EXAM: CT ABDOMEN AND PELVIS WITH CONTRAST TECHNIQUE: Multidetector CT imaging of the abdomen and pelvis was performed using the standard protocol following bolus administration of intravenous contrast. RADIATION DOSE REDUCTION: This  exam was performed according to the departmental dose-optimization program which includes automated exposure control, adjustment of the mA and/or kV according to patient size and/or use of iterative reconstruction technique. CONTRAST:  75mL OMNIPAQUE IOHEXOL 350 MG/ML SOLN COMPARISON:  CT angiogram 10/26/2018 FINDINGS: Lower chest: There is some breathing motion at the lung bases. No pleural effusion. Hepatobiliary: No focal liver abnormality is seen. No gallstones, gallbladder wall thickening, or biliary dilatation. Pancreas: Unremarkable. No pancreatic ductal dilatation or surrounding inflammatory changes. Spleen: Normal in size without focal abnormality. Adrenals/Urinary Tract: Minimal thickening of the left adrenal gland is stable. The right adrenal gland is preserved. No enhancing renal mass or collecting system dilatation. Tiny low-attenuation lesion seen in the left kidney are too small to completely characterize but likely benign cysts. No specific imaging follow-up of these Bosniak 2 lesions. The ureters have normal course and caliber extending down to the bladder. Bladder is underdistended. Stomach/Bowel: No oral contrast. The colon has a normal course and caliber with scattered stool. Left-sided colonic diverticula. There is a normal appendix in the right hemipelvis. Small bowel is nondilated. Vascular/Lymphatic: Aortic atherosclerosis. No enlarged abdominal or pelvic lymph nodes. 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 Reproductive: Lobular uterus consistent with multiple fibroids. There is some component of dystrophic calcification along the larger left-sided fibroid. Other: Thickening and clips along the left inguinal region. No free air or free fluid. Musculoskeletal: Mild degenerative changes seen of the spine and pelvis. IMPRESSION: No bowel obstruction, free air or free fluid. Few colonic diverticula. Normal appendix. Fibroid uterus. Surgical changes along the left inguinal  region. Electronically Signed   By: Karen Kays M.D.   On: 07/25/2022 15:18   CT HEAD WO CONTRAST ( )  Result Date: 07/25/2022 CLINICAL DATA:  Headaches with increasing frequency and severity, dizziness EXAM: CT HEAD WITHOUT CONTRAST TECHNIQUE: Contiguous axial images were obtained from the base of the skull through the vertex without intravenous contrast. RADIATION DOSE REDUCTION: This exam was performed according to the departmental dose-optimization program which includes automated exposure control, adjustment of the mA and/or kV according to patient size and/or use of iterative reconstruction  technique. COMPARISON:  07/10/2016 FINDINGS: Brain: No acute intracranial findings are seen. There are no signs of bleeding within the cranium. Ventricles are not dilated. Cortical sulci are prominent. Vascular: Unremarkable. Skull: Unremarkable. Sinuses/Orbits: Unremarkable. Other: No significant interval changes are noted. IMPRESSION: No acute intracranial findings are seen. Electronically Signed   By: Ernie Avena M.D.   On: 07/25/2022 15:16     Signed: Modena Slater, D.O.  Internal Medicine Resident, PGY-1 [facility]  Pager: [phone]

## 2022-07-26 NOTE — TOC Transition Note (Signed)
Transition of Care [facility]) - CM/SW Discharge Note   Patient Details  Name: Megan Riddle MRN: [id number] Date of Birth: [birth year]  [facility] CM/SW Contact:  Tom-Johnson, Hershal Coria, RN Phone Number: 07/26/2022, 2:48 PM   Clinical Narrative:     Patient is scheduled for discharge today.  Outpatient referral, hospital f/u and discharge instructions on AVS. Prescriptions sent to [facility] pharmacy and meds will be delivered to patient at bedside prior discharge.  Patient requested for a cab, Therapist, nutritional and Release of Liability form explained to patient with understanding verbalized, form signed and placed in patient's chart. Cab voucher given to RN. No further TOC needs noted.  Final next level of care: Home/Self Care Barriers to Discharge: Barriers Resolved   Patient Goals and CMS Choice CMS Medicare.gov Compare Post Acute Care list provided to:: Patient Choice offered to / list presented to : NA  Discharge Placement                  Patient to be transferred to facility by: [facility]      Discharge Plan and Services Additional resources added to the After Visit Summary for                  DME Arranged: N/A DME Agency: NA       HH Arranged: NA HH Agency: NA        Social Determinants of Health (SDOH) Interventions SDOH Screenings   Food Insecurity: No Food Insecurity (07/25/2022)  Housing: Patient Declined (07/25/2022)  Transportation Needs: No Transportation Needs (07/25/2022)  Utilities: Not At Risk (07/25/2022)  Depression (PHQ2-9): High Risk (04/05/2021)  Tobacco Use: High Risk (07/25/2022)     Readmission Risk Interventions     No data to display

## 2022-07-26 NOTE — Plan of Care (Signed)

## 2022-07-27 LAB — HEMOGLOBIN A1C
Hgb A1c MFr Bld: 7.4 % — ABNORMAL HIGH (ref 4.8–5.6)
Mean Plasma Glucose: 166 mg/dL

## 2022-08-15 ENCOUNTER — Ambulatory Visit (HOSPITAL_COMMUNITY): Payer: MEDICAID | Attending: Vascular Surgery

## 2022-08-15 ENCOUNTER — Encounter: Payer: Medicaid Other | Admitting: Vascular Surgery

## 2022-09-18 ENCOUNTER — Other Ambulatory Visit: Payer: Self-pay | Admitting: *Deleted

## 2022-09-18 DIAGNOSIS — I745 Embolism and thrombosis of iliac artery: Secondary | ICD-10-CM

## 2022-09-26 ENCOUNTER — Encounter: Payer: MEDICAID | Admitting: Vascular Surgery

## 2022-09-26 ENCOUNTER — Ambulatory Visit (HOSPITAL_COMMUNITY): Payer: MEDICAID | Attending: Vascular Surgery

## 2022-10-10 ENCOUNTER — Ambulatory Visit: Payer: MEDICAID | Admitting: Podiatry

## 2022-10-18 ENCOUNTER — Telehealth: Payer: Self-pay | Admitting: Primary Care

## 2022-10-18 NOTE — Telephone Encounter (Signed)
Pt will like to see about getting a Cpap machine

## 2022-10-22 NOTE — Telephone Encounter (Signed)
LM to call for appt w/Ms. Clent Ridges.

## 2022-10-22 NOTE — Telephone Encounter (Signed)
Needs OV to review sleep study results. Please contact to schedule. Thank you!

## 2022-10-25 NOTE — Telephone Encounter (Signed)
Patient states needs CPAP machine and supplies. Would like a call back. Patient scheduled 12/13/2022 with Ames Dura NP. Patient phone number is [phone].

## 2022-10-30 NOTE — Telephone Encounter (Signed)
Pt is schedule to see BW on 11/15 @11 :30, pt needs a CPAP machine

## 2022-10-30 NOTE — Telephone Encounter (Signed)
Pt is scheduled for appt to review results and CPAP can be ordered at that time if appropriate. I called the pt and notified her of this. Pt understands to keep appt for results. Nothing further needed.

## 2022-11-18 ENCOUNTER — Ambulatory Visit (HOSPITAL_COMMUNITY): Payer: MEDICAID | Attending: Vascular Surgery

## 2022-11-18 ENCOUNTER — Encounter: Payer: MEDICAID | Admitting: Surgery

## 2022-12-13 ENCOUNTER — Ambulatory Visit: Payer: MEDICAID | Admitting: Primary Care

## 2022-12-13 NOTE — Progress Notes (Deleted)
@Patient  ID: Megan Riddle, female    DOB: 18-Jun-1975, 47 y.o.   MRN: [id number]  No chief complaint on file.   Referring provider: Filomena Jungling, NP  HPI: 47 year old female, current every day smoker. PMH significant for sleep apnea, hyperlipidemia, type 2 diabetes, obesity, PTSD, nicotine dependence.   Previous [facility] pulmonary encounter:  04/26/2022 Patient presents today for sleep consult.  Dx with sleep apnea in 2013. PSG in May 2018, AHI 25.3/hour with Spo2 low 84%.  She has been off CPAP for 1.5 years. She lost her machine in storage facility. She had lost weight and symptoms improved but she has slowly regained some weight. She has snoring symptoms and has been told by by her son that she stops breathing in her sleep. She will at times wake herself up gasping for air. She is a side sleeper. Typical bedtime is between 8-9pm. It takes her an hour to fall asleep. She wakes up to 4 times a night. She starts her day at 6am.  Denies symptoms of narcolepsy, cataplexy, sleepwalking.   She is a current smoker, cut down to 4 cigarettes a day. She has picked a quit date for this Sunday.     OSA (obstructive sleep apnea) - NPSG in May 2018, AHI 25.3/hour with Spo2 low 84%. Weight is down 50 lbs since last sleep study. Patient has been off CPAP for 1.5 years. Symptoms initially improved with weight loss but have worsened recently. She has symptoms of loud snoring and witnessed apnea. Needs repeat sleep study to re-assess OSA, we can then place an order to re-start CPAP if needed.  Reviewed risks of untreated sleep apnea including cardiac arrhythmias, pulmonary hypertension, diabetes and stroke.  We also discussed treatment options including weight loss, oral appliance, CPAP therapy or referral to ENT for possible surgical options. Encourage side sleeping position and weight loss. FU 1-2 weeks after sleep study results.    Tobacco user - Current smoker, she has cut back to 4 cigarettes a day. She  has picked quit date for March 31st. Smoking cessation encourage and support provided.      11 /15/2024- Interim hx  Patient presents today to review sleep study results.              No Known Allergies  Immunization History  Administered Date(s) Administered   Fluad Quad(high Dose 65+) 01/13/2017   Influenza Inj Mdck Quad Pf 10/17/2021   Influenza, High Dose Seasonal PF 03/24/2018   Influenza,inj,Quad PF,6+ Mos 10/02/2015, 01/13/2017, 03/24/2018   Influenza-Unspecified 11/27/2012, 10/02/2015   PFIZER(Purple Top)SARS-COV-2 Vaccination 09/19/2019, 10/15/2019   Pneumococcal Polysaccharide-23 11/27/2012    Past Medical History:  Diagnosis Date   Arthritis    Cancer (HCC)    breast   Diabetes mellitus without complication (HCC)    DVT (deep venous thrombosis) (HCC)     Tobacco History: Social History   Tobacco Use  Smoking Status Every Day   Current packs/day: 0.50   Average packs/day: 0.5 packs/day for 27.0 years (13.5 ttl pk-yrs)   Types: Cigarettes  Smokeless Tobacco Never  Tobacco Comments   Down to 4 cigarettes per day.  Trying to quit.  04/26/2022 hfb   Ready to quit: Not Answered Counseling given: Not Answered Tobacco comments: Down to 4 cigarettes per day.  Trying to quit.  04/26/2022 hfb   Outpatient Medications Prior to Visit  Medication Sig Dispense Refill   Cyanocobalamin (VITAMIN B 12 PO) Take 1 tablet by mouth daily.     glipiZIDE (  GLUCOTROL) 5 MG tablet Take 5 mg by mouth 2 (two) times daily.     pantoprazole (PROTONIX) 40 MG tablet Take 1 tablet (40 mg total) by mouth daily. 30 tablet 0   TURMERIC PO Take 1 tablet by mouth daily.     No facility-administered medications prior to visit.      Review of Systems  Review of Systems   Physical Exam  LMP 12/20/2017 (Approximate) Comment: " I don't even remember".  Physical Exam   Lab Results:  CBC    Component Value Date/Time   WBC 10.3 07/25/2022 1233   RBC 3.99 07/25/2022 1233    HGB 13.2 07/25/2022 1233   HCT 38.8 07/25/2022 1233   PLT 243 07/25/2022 1233   MCV 97.2 07/25/2022 1233   MCH 33.1 07/25/2022 1233   MCHC 34.0 07/25/2022 1233   RDW 11.7 07/25/2022 1233   LYMPHSABS 3.5 07/25/2022 1233   MONOABS 0.7 07/25/2022 1233   EOSABS 0.2 07/25/2022 1233   BASOSABS 0.1 07/25/2022 1233    BMET    Component Value Date/Time   NA 134 (L) 07/26/2022 0033   K 3.2 (L) 07/26/2022 0033   CL 98 07/26/2022 0033   CO2 27 07/26/2022 0033   GLUCOSE 211 (H) 07/26/2022 0033   BUN 17 07/26/2022 0033   CREATININE 0.75 07/26/2022 0033   CALCIUM 9.1 07/26/2022 0033   GFRNONAA >60 07/26/2022 0033   GFRAA >60 06/19/2019 1411    BNP No results found for: "BNP"  ProBNP No results found for: "PROBNP"  Imaging: No results found.   Assessment & Plan:   No problem-specific Assessment & Plan notes found for this encounter.     Glenford Bayley, NP 12/13/2022

## 2023-01-10 ENCOUNTER — Telehealth: Payer: Self-pay | Admitting: Primary Care

## 2023-01-10 ENCOUNTER — Telehealth (INDEPENDENT_AMBULATORY_CARE_PROVIDER_SITE_OTHER): Payer: MEDICAID | Admitting: Primary Care

## 2023-01-10 DIAGNOSIS — G4733 Obstructive sleep apnea (adult) (pediatric): Secondary | ICD-10-CM

## 2023-01-10 NOTE — Telephone Encounter (Signed)
Patient had a virtual visit today to review sleep study results, CPAP order has placed. Please call and schedule follow-up appointment with me 31 to 90 days for CPAP compliance.  Please call and schedule.

## 2023-01-10 NOTE — Progress Notes (Signed)
Virtual Visit via Video Note  I connected with Megan Riddle on 01/10/23 at  3:00 PM EST by a video enabled telemedicine application and verified that I am speaking with the correct person using two identifiers.  Location: Patient: Home Provider: Office    I discussed the limitations of evaluation and management by telemedicine and the availability of in person appointments. The patient expressed understanding and agreed to proceed.  History of Present Illness: 47 year old female, current every day smoker. PMH significant for sleep apnea, hyperlipidemia, type 2 diabetes, obesity, PTSD, nicotine dependence.   Previous [facility] pulmonary encounter: 04/26/2022 Patient presents today for sleep consult.  Dx with sleep apnea in 2013. PSG in May 2018, AHI 25.3/hour with Spo2 low 84%.  She has been off CPAP for 1.5 years. She lost her machine in storage facility. She had lost weight and symptoms improved but she has slowly regained some weight. She has snoring symptoms and has been told by by her son that she stops breathing in her sleep. She will at times wake herself up gasping for air. She is a side sleeper. Typical bedtime is between 8-9pm. It takes her an hour to fall asleep. She wakes up to 4 times a night. She starts her day at 6am.  Denies symptoms of narcolepsy, cataplexy, sleepwalking.   She is a current smoker, cut down to 4 cigarettes a day. She has picked a quit date for this Sunday.    01/10/2023- Interim hx  Patient contacted today for virtual visit to review sleep study results.  She had a previous visit scheduled in November which patient no showed.   Dx with sleep apnea in 2013. PSG in May 2018, AHI 25.3/hour with Spo2 low 84%.  She has been off CPAP for 1.5 years. She lost her machine in storage facility. She had lost weight and symptoms improved but she has slowly regained some weight. She has snoring symptoms and has been told by by her son that she stops breathing in her  sleep.  Polysomnography in May 2024 showed moderate obstructive sleep apnea with an AHI of 24.3 and SpO2 low of 80%. Significant REM effect. She slept mostly in the non-supine position. Treatment options recommended CPAP, oral appliance or surgical risk assessment. We reviewed sleep study today, risks of untreated sleep apnea and treatment options.  Patient is open to resuming CPAP therapy.  She reports preference to nasal pillow mask over a full face mask.  Advised patient aim to wear CPAP nightly for 4 to 6 hours or longer.  Advised patient continue weight loss efforts.   Observations/Objective:  Unable to login via mychart and did not received text link, convert to televisit   Assessment and Plan:  Obstructive Sleep Apnea  -History of sleep apnea, previously on CPAP but stopped over a year and a half ago.  Repeat sleep study in May 2024 showed moderate obstructive sleep apnea, AHI 24.3/h with SpO2 low 80%.  Reviewed sleep study results today and treatment options.  Patient in agreement to restarting CPAP.  DME order placed for auto CPAP 5 to 15 cm H2O with mask choice, patient prefers nasal pillow mask.  Advised patient aim to wear CPAP nightly for minimum 4 to 6 hours or longer.  Encouraged weight loss efforts as able.  Advised against driving if experiencing excessive daytime sleepiness or fatigue.  Follow Up Instructions:  Patient follow-up in 31 to 90 days for CPAP compliance and percent  I discussed the assessment and treatment plan  with the patient. The patient was provided an opportunity to ask questions and all were answered. The patient agreed with the plan and demonstrated an understanding of the instructions.   The patient was advised to call back or seek an in-person evaluation if the symptoms worsen or if the condition fails to improve as anticipated.  I provided 22 minutes of non-face-to-face time during this encounter.   Glenford Bayley, NP

## 2023-01-16 ENCOUNTER — Telehealth: Payer: Self-pay | Admitting: Primary Care

## 2023-01-16 NOTE — Telephone Encounter (Signed)
disregard

## 2023-02-20 ENCOUNTER — Encounter (HOSPITAL_COMMUNITY): Payer: Self-pay | Admitting: Emergency Medicine

## 2023-02-20 ENCOUNTER — Emergency Department (HOSPITAL_BASED_OUTPATIENT_CLINIC_OR_DEPARTMENT_OTHER): Payer: MEDICAID

## 2023-02-20 ENCOUNTER — Emergency Department (HOSPITAL_COMMUNITY): Payer: MEDICAID

## 2023-02-20 ENCOUNTER — Emergency Department (HOSPITAL_COMMUNITY)
Admission: EM | Admit: 2023-02-20 | Discharge: 2023-02-20 | Disposition: A | Discharge status: Home / Self Care | Payer: MEDICAID | Attending: Emergency Medicine | Admitting: Emergency Medicine

## 2023-02-20 DIAGNOSIS — M79605 Pain in left leg: Secondary | ICD-10-CM

## 2023-02-20 DIAGNOSIS — D72829 Elevated white blood cell count, unspecified: Secondary | ICD-10-CM | POA: Insufficient documentation

## 2023-02-20 DIAGNOSIS — Z7984 Long term (current) use of oral hypoglycemic drugs: Secondary | ICD-10-CM | POA: Insufficient documentation

## 2023-02-20 DIAGNOSIS — E876 Hypokalemia: Secondary | ICD-10-CM | POA: Insufficient documentation

## 2023-02-20 DIAGNOSIS — Z20822 Contact with and (suspected) exposure to covid-19: Secondary | ICD-10-CM | POA: Insufficient documentation

## 2023-02-20 DIAGNOSIS — I739 Peripheral vascular disease, unspecified: Secondary | ICD-10-CM

## 2023-02-20 DIAGNOSIS — Z720 Tobacco use: Secondary | ICD-10-CM | POA: Insufficient documentation

## 2023-02-20 DIAGNOSIS — I70212 Atherosclerosis of native arteries of extremities with intermittent claudication, left leg: Secondary | ICD-10-CM | POA: Insufficient documentation

## 2023-02-20 DIAGNOSIS — E119 Type 2 diabetes mellitus without complications: Secondary | ICD-10-CM | POA: Insufficient documentation

## 2023-02-20 DIAGNOSIS — M79662 Pain in left lower leg: Secondary | ICD-10-CM

## 2023-02-20 LAB — RESP PANEL BY RT-PCR (RSV, FLU A&B, COVID)  RVPGX2
Influenza A by PCR: NEGATIVE
Influenza B by PCR: NEGATIVE
Resp Syncytial Virus by PCR: NEGATIVE
SARS Coronavirus 2 by RT PCR: NEGATIVE

## 2023-02-20 LAB — CBC WITH DIFFERENTIAL/PLATELET
Abs Immature Granulocytes: 0.03 10*3/uL (ref 0.00–0.07)
Basophils Absolute: 0.1 10*3/uL (ref 0.0–0.1)
Basophils Relative: 0 %
Eosinophils Absolute: 0.1 10*3/uL (ref 0.0–0.5)
Eosinophils Relative: 0 %
HCT: 35.8 % — ABNORMAL LOW (ref 36.0–46.0)
Hemoglobin: 12.5 g/dL (ref 12.0–15.0)
Immature Granulocytes: 0 %
Lymphocytes Relative: 29 %
Lymphs Abs: 3.3 10*3/uL (ref 0.7–4.0)
MCH: 33.6 pg (ref 26.0–34.0)
MCHC: 34.9 g/dL (ref 30.0–36.0)
MCV: 96.2 fL (ref 80.0–100.0)
Monocytes Absolute: 0.6 10*3/uL (ref 0.1–1.0)
Monocytes Relative: 5 %
Neutro Abs: 7.4 10*3/uL (ref 1.7–7.7)
Neutrophils Relative %: 66 %
Platelets: 270 10*3/uL (ref 150–400)
RBC: 3.72 MIL/uL — ABNORMAL LOW (ref 3.87–5.11)
RDW: 11.8 % (ref 11.5–15.5)
WBC: 11.5 10*3/uL — ABNORMAL HIGH (ref 4.0–10.5)
nRBC: 0 % (ref 0.0–0.2)

## 2023-02-20 LAB — COMPREHENSIVE METABOLIC PANEL
ALT: 18 U/L (ref 0–44)
AST: 20 U/L (ref 15–41)
Albumin: 2.7 g/dL — ABNORMAL LOW (ref 3.5–5.0)
Alkaline Phosphatase: 24 U/L — ABNORMAL LOW (ref 38–126)
Anion gap: 6 (ref 5–15)
BUN: <5 mg/dL — ABNORMAL LOW (ref 6–20)
CO2: 18 mmol/L — ABNORMAL LOW (ref 22–32)
Calcium: 7.1 mg/dL — ABNORMAL LOW (ref 8.9–10.3)
Chloride: 113 mmol/L — ABNORMAL HIGH (ref 98–111)
Creatinine, Ser: 0.4 mg/dL — ABNORMAL LOW (ref 0.44–1.00)
GFR, Estimated: >60 mL/min (ref >60)
Glucose, Bld: 225 mg/dL — ABNORMAL HIGH (ref 70–99)
Potassium: 2.9 mmol/L — ABNORMAL LOW (ref 3.5–5.1)
Sodium: 137 mmol/L (ref 135–145)
Total Bilirubin: 0.6 mg/dL (ref 0.0–1.2)
Total Protein: 5.1 g/dL — ABNORMAL LOW (ref 6.5–8.1)

## 2023-02-20 LAB — VAS US ABI WITH/WO TBI
Left ABI: 1.08
Right ABI: 1.07

## 2023-02-20 LAB — TROPONIN I (HIGH SENSITIVITY)
Troponin I (High Sensitivity): 3 ng/L (ref <18)
Troponin I (High Sensitivity): 3 ng/L (ref <18)

## 2023-02-20 MED ORDER — POTASSIUM CHLORIDE CRYS ER 20 MEQ PO TBCR
40.0000 meq | EXTENDED_RELEASE_TABLET | Freq: Once | ORAL | Status: AC
  Administered 2023-02-20: 40 meq via ORAL
  Filled 2023-02-20: qty 2

## 2023-02-20 NOTE — Discharge Instructions (Addendum)
You were seen in the ER today for concerns of leg pain and chest pain. Your labs and imaging were thankfully reassuring with no signs of heart damage and it appears that you have stable findings on your ultrasounds with no acute concern for occlusion in the left leg or DVT in the leg. I spoke with Dr. Chestine Spore with vascular surgery who agrees that you would benefit from outpatient follow up in his office for this. I have provided you with his information. His office is likely to reach out to you but you can call them tomorrow if you would like as well.

## 2023-02-20 NOTE — Progress Notes (Signed)
VASCULAR LAB    ABI has been performed.  See CV proc for preliminary results.   Rima Blizzard, RVT 02/20/2023, 11:12 AM

## 2023-02-20 NOTE — ED Provider Notes (Signed)
[place] EMERGENCY DEPARTMENT AT [facility] Provider Note   CSN: [id number] Arrival date & time: 02/20/23  8295     History Chief Complaint  Patient presents with   Leg Pain   Chest Pain   Shortness of Breath    Megan Riddle is a 48 y.o. female.  Patient with past history significant for left iliac artery occlusion, type 2 diabetes, palpitations, tobacco use presents emergency department with concerns of leg pain.  She reports that she has been experiencing left calf pain for about 3 days.  Pain typically worsens with ambulation.  Occasionally feels that there is diminished pulses in the left leg and it feels that it goes cold.  Also has some associated shortness of breath and chest pain.  Not currently on blood thinners.   Leg Pain Chest Pain Associated symptoms: shortness of breath   Shortness of Breath Associated symptoms: chest pain        Home Medications Prior to Admission medications   Medication Sig Start Date End Date Taking? Authorizing Provider  Cyanocobalamin (VITAMIN B 12 PO) Take 1 tablet by mouth daily.    [provider]  glipiZIDE (GLUCOTROL) 5 MG tablet Take 5 mg by mouth 2 (two) times daily.    [provider]  pantoprazole (PROTONIX) 40 MG tablet Take 1 tablet (40 mg total) by mouth daily. 07/27/22   Modena Slater, DO  TURMERIC PO Take 1 tablet by mouth daily.    [provider]      Allergies    Patient has no known allergies.    Review of Systems   Review of Systems  Respiratory:  Positive for shortness of breath.   Cardiovascular:  Positive for chest pain.  All other systems reviewed and are negative.   Physical Exam Updated Vital Signs BP 115/65   Pulse 87   Temp 98.3 F (36.8 C) (Oral)   Resp 15   LMP 12/20/2017 (Approximate) Comment: " I don't even remember".   SpO2 100%  Physical Exam Vitals and nursing note reviewed.  Constitutional:      General: She is not in acute distress.     Appearance: She is well-developed.  HENT:     Head: Normocephalic and atraumatic.  Eyes:     Conjunctiva/sclera: Conjunctivae normal.  Cardiovascular:     Rate and Rhythm: Normal rate and regular rhythm.     Pulses:          Dorsalis pedis pulses are 2+ on the right side and 1+ on the left side.       Posterior tibial pulses are 2+ on the right side and 1+ on the left side.     Heart sounds: No murmur heard. Pulmonary:     Effort: Pulmonary effort is normal. No respiratory distress.     Breath sounds: Normal breath sounds.  Abdominal:     Palpations: Abdomen is soft.     Tenderness: There is no abdominal tenderness.  Musculoskeletal:        General: No swelling.     Cervical back: Neck supple.  Skin:    General: Skin is warm and dry.     Capillary Refill: Capillary refill takes less than 2 seconds.  Neurological:     Mental Status: She is alert.  Psychiatric:        Mood and Affect: Mood normal.     ED Results / Procedures / Treatments   Labs (all labs ordered are listed, but only abnormal results  are displayed) Labs Reviewed  CBC WITH DIFFERENTIAL/PLATELET - Abnormal; Notable for the following components:      Result Value   WBC 11.5 (*)    RBC 3.72 (*)    HCT 35.8 (*)    All other components within normal limits  COMPREHENSIVE METABOLIC PANEL - Abnormal; Notable for the following components:   Potassium 2.9 (*)    Chloride 113 (*)    CO2 18 (*)    Glucose, Bld 225 (*)    BUN <5 (*)    Creatinine, Ser 0.40 (*)    Calcium 7.1 (*)    Total Protein 5.1 (*)    Albumin 2.7 (*)    Alkaline Phosphatase 24 (*)    All other components within normal limits  RESP PANEL BY RT-PCR (RSV, FLU A&B, COVID)  RVPGX2  TROPONIN I (HIGH SENSITIVITY)  TROPONIN I (HIGH SENSITIVITY)    EKG EKG Interpretation Date/Time:  Thursday February 20 2023 09:29:10 EST Ventricular Rate:  82 PR Interval:  130 QRS Duration:  87 QT Interval:  390 QTC Calculation: 456 R Axis:   54  Text  Interpretation: Sinus rhythm Confirmed by Alona Bene [phone]) on 02/20/2023 10:13:25 AM  Radiology VAS [place] LOWER EXTREMITY VENOUS (DVT) (7a-7p) Result Date: 02/20/2023  Lower Venous DVT Study Patient Name:  Megan Riddle  Date of Exam:   02/20/2023 Medical Rec #: [id number]           Accession #:    [id number] Date of Birth: 02-11-1975           Patient Gender: F Patient Age:   88 years Exam Location:  [facility] Procedure:      VAS [place] LOWER EXTREMITY VENOUS (DVT) Referring Phys: Maryanna Shape --------------------------------------------------------------------------------  Indications: Calf pain, tingling, numbness X 3 days.  Risk Factors: History of thrombosis of the left common and external iliac arteries with thrombectomy 03/22/18. Comparison Study: Prior negative left LEV done 06/19/19 Performing Technologist: Sherren Kerns RVS  Examination Guidelines: A complete evaluation includes B-mode imaging, spectral Doppler, color Doppler, and power Doppler as needed of all accessible portions of each vessel. Bilateral testing is considered an integral part of a complete examination. Limited examinations for reoccurring indications may be performed as noted. The reflux portion of the exam is performed with the patient in reverse Trendelenburg.  +-----+---------------+---------+-----------+----------+--------------+ RIGHTCompressibilityPhasicitySpontaneityPropertiesThrombus Aging +-----+---------------+---------+-----------+----------+--------------+ CFV  Full           Yes      Yes                                 +-----+---------------+---------+-----------+----------+--------------+   +---------+---------------+---------+-----------+----------+--------------+ LEFT     CompressibilityPhasicitySpontaneityPropertiesThrombus Aging +---------+---------------+---------+-----------+----------+--------------+ CFV      Full           Yes      Yes                                  +---------+---------------+---------+-----------+----------+--------------+ SFJ      Full                                                        +---------+---------------+---------+-----------+----------+--------------+ FV Prox  Full                                                        +---------+---------------+---------+-----------+----------+--------------+  FV Mid   Full                                                        +---------+---------------+---------+-----------+----------+--------------+ FV DistalFull           Yes      Yes                                 +---------+---------------+---------+-----------+----------+--------------+ PFV      Full                                                        +---------+---------------+---------+-----------+----------+--------------+ POP      Full           Yes      Yes                                 +---------+---------------+---------+-----------+----------+--------------+ PTV      Full                                                        +---------+---------------+---------+-----------+----------+--------------+ PERO     Full                                                        +---------+---------------+---------+-----------+----------+--------------+    Summary: RIGHT: - No evidence of common femoral vein obstruction.   LEFT: - There is no evidence of deep vein thrombosis in the lower extremity.  - No cystic structure found in the popliteal fossa. - Incidentally, the left lower extremity arterial system appears patent with biphasic waveforms noted in the CFA and proximal to mid SFA, and monophasic waveforms in the distal SFA, Popliteal and posterior tibial arteries.  *See table(s) above for measurements and observations. Electronically signed by Sherald Hess MD on 02/20/2023 at 1:23:13 PM.    Final    VAS [place] ABI WITH/WO TBI Result Date: 02/20/2023  LOWER EXTREMITY DOPPLER STUDY Patient  Name:  AMILLYA CHAVIRA  Date of Exam:   02/20/2023 Medical Rec #: [id number]           Accession #:    [id number] Date of Birth: [birth year]           Patient Gender: F Patient Age:   44 years Exam Location:  [facility] Procedure:      VAS [place] ABI WITH/WO TBI Referring Phys: Maryanna Shape --------------------------------------------------------------------------------  Indications: Claudication. Patient endorsing bilateral hip pain and left calf              pain with walking long distances and up stairs. Numbness and              tingling. High Risk Factors: Hyperlipidemia,  Diabetes, current smoker.  Vascular Interventions: History of thrombus in left common iliac and external                         iliac with left iliofemoral thrombectomy and vein patch                         angioplasty of the left common femoral artery with                         saphenous vein graft 03/22/18. Comparison Study: Prior ABI done 10/21/18 Performing Technologist: Sherren Kerns RVS  Examination Guidelines: A complete evaluation includes at minimum, Doppler waveform signals and systolic blood pressure reading at the level of bilateral brachial, anterior tibial, and posterior tibial arteries, when vessel segments are accessible. Bilateral testing is considered an integral part of a complete examination. Photoelectric Plethysmograph (PPG) waveforms and toe systolic pressure readings are included as required and additional duplex testing as needed. Limited examinations for reoccurring indications may be performed as noted.  ABI Findings: +---------+------------------+-----+-----------+--------+ Right    Rt Pressure (mmHg)IndexWaveform   Comment  +---------+------------------+-----+-----------+--------+ Brachial 132                    triphasic           +---------+------------------+-----+-----------+--------+ PTA      159               1.07 multiphasic          +---------+------------------+-----+-----------+--------+ DP       154               1.03 multiphasic         +---------+------------------+-----+-----------+--------+ Great Toe101               0.68 Normal              +---------+------------------+-----+-----------+--------+ +---------+------------------+-----+-----------+-------+ Left     Lt Pressure (mmHg)IndexWaveform   Comment +---------+------------------+-----+-----------+-------+ Brachial 149                    triphasic          +---------+------------------+-----+-----------+-------+ PTA      154               1.03 multiphasic        +---------+------------------+-----+-----------+-------+ DP       161               1.08 multiphasic        +---------+------------------+-----+-----------+-------+ Great Toe70                0.47 Normal             +---------+------------------+-----+-----------+-------+ +-------+-----------+-----------+------------+------------+ ABI/TBIToday's ABIToday's TBIPrevious ABIPrevious TBI +-------+-----------+-----------+------------+------------+ Right  1.07       0.68       1.1         0.85         +-------+-----------+-----------+------------+------------+ Left   1.08       0.47       1.09        0.43         +-------+-----------+-----------+------------+------------+ Bilateral ABIs and left TBI appear essentially unchanged compared to prior study on 03/22/18. Right TBIs appear decreased compared to prior study on 03/22/18.  Summary: Right: Resting right ankle-brachial index is within normal range. The right toe-brachial index is normal. Left: Resting left  ankle-brachial index is within normal range. The left toe-brachial index is abnormal. *See table(s) above for measurements and observations.  Electronically signed by Sherald Hess MD on 02/20/2023 at 1:22:50 PM.    Final    DG Chest Portable 1 View Result Date: 02/20/2023 CLINICAL DATA:  Three day history of  left calf pain with new chest pain and shortness of breath EXAM: PORTABLE CHEST 1 VIEW COMPARISON:  Chest radiograph dated 05/11/2021 FINDINGS: Normal lung volumes. Minimal bibasilar patchy opacities. No pleural effusion or pneumothorax. The heart size and mediastinal contours are within normal limits. No acute osseous abnormality. IMPRESSION: Minimal bibasilar patchy opacities, likely atelectasis. Electronically Signed   By: Agustin Cree M.D.   On: 02/20/2023 10:08    Procedures Procedures    Medications Ordered in ED Medications  potassium chloride SA (KLOR-CON M) CR tablet 40 mEq (has no administration in time range)    ED Course/ Medical Decision Making/ A&P                                 Medical Decision Making Amount and/or Complexity of Data Reviewed Labs: ordered. Radiology: ordered.   This patient presents to the ED for concern leg pain. Differential diagnosis includes DVT, peripheral artery disease, arterial occlusion, acute ischemic limb    Lab Tests:  I Ordered, and personally interpreted labs.  The pertinent results include: CBC with mild leukocytosis at 11.5, CMP shows a potassium of 2.9 with no evident renal impairment, troponin negative at 3, respiratory panel negative for COVID-19, influenza and RSV   Imaging Studies ordered:  I ordered imaging studies including chest x-ray, DVT ultrasound, ABI ultrasound I independently visualized and interpreted imaging which showed minimal bibasilar patchy opacities, likely atelectasis, no evidence of DVT, Right: Resting right ankle-brachial index is within normal range. The right toe-brachial index is normal. Left: Resting left ankle-brachial index is within normal range. The left toe-brachial index is abnormal. I agree with the radiologist interpretation   Problem List / ED Course:  Patient presents to the emergency department with past history significant for occlusion of the left iliac artery, tobacco use, here with  concerns of leg pain and chest pain.  She reports that this is been ongoing for the last 3 days.  Not on blood thinners.  Prior history of occlusion to the left iliac artery managed by vascular surgery.  She states that she has not followed up with them now in several years. On exam, she has a weak/thready pulses in the left lower extremity with + pulses felt at the [facility] and PT sites.  Right leg has normal pulses that are 2+.  No pitting edema seen.  There is adequate perfusion in the left lower extremity with full neurological sensation and mobility of the leg.  Patient's symptoms appear to be highly consistent with claudication.  In the setting of prior arterial occlusion, will obtain ultrasound imaging of the leg to measure ABIs as well as DVT rule out.  Patient otherwise comfortable at rest. US imaging is reassuring without signs of DVT. ABI appears to be stable compared to ABI from 2020 around the time patient had occlusion found. Will consult with vascular to set patient up for outpatient follow up. Spoke with Dr. Chestine Spore, vascular surgery, who reviewed patient's history and US imaging from today. Feels that she is likely stable for outpatient follow up but would benefit from evaluation given prior history of iliofemoral embolectomy.  I provided patient with contact information for Dr. Ophelia Charter office with vascular surgery.  Reviewed current plan as well as return precautions.  Patient agreement with current plan.  Otherwise stable at this time and discharged home in good condition.  Final Clinical Impression(s) / ED Diagnoses Final diagnoses:  Left leg claudication (HCC)  Hypokalemia    Rx / DC Orders ED Discharge Orders     None         Smitty Knudsen, PA-C 02/20/23 1344    Maia Plan, MD 02/21/23 1001

## 2023-02-20 NOTE — ED Triage Notes (Signed)
BIB EMS fro left calf pain x 3 days. Hx of blood clots in left leg. Chest pain and SOB started today. Pt states both legs get numb with standing and walking.   157/70 HR 82 100% RA RR18 CBG 297  18g L Hand 324mg 

## 2023-02-20 NOTE — Progress Notes (Signed)
VASCULAR LAB    Left lower extremity venous duplex has been performed.  See CV proc for preliminary results.   Stepheny Canal, RVT 02/20/2023, 11:12 AM

## 2023-02-24 ENCOUNTER — Other Ambulatory Visit: Payer: Self-pay | Admitting: Podiatry

## 2023-03-14 ENCOUNTER — Other Ambulatory Visit: Payer: Self-pay | Admitting: *Deleted

## 2023-03-14 DIAGNOSIS — I745 Embolism and thrombosis of iliac artery: Secondary | ICD-10-CM

## 2023-03-27 ENCOUNTER — Encounter (HOSPITAL_COMMUNITY): Payer: MEDICAID

## 2023-03-27 ENCOUNTER — Ambulatory Visit: Payer: MEDICAID | Admitting: Vascular Surgery

## 2023-03-31 ENCOUNTER — Ambulatory Visit (HOSPITAL_COMMUNITY): Admission: RE | Admit: 2023-03-31 | Payer: MEDICAID | Source: Ambulatory Visit

## 2023-04-01 ENCOUNTER — Other Ambulatory Visit: Payer: Self-pay | Admitting: *Deleted

## 2023-04-01 DIAGNOSIS — I745 Embolism and thrombosis of iliac artery: Secondary | ICD-10-CM

## 2023-04-01 NOTE — Progress Notes (Deleted)
 Office Note     CC:  Intermittent claudication in the leg Requesting Provider:  Filomena Jungling, NP  HPI: Megan Riddle is a 48 y.o. (12/13/1975) female presenting at the request of .Filomena Jungling, NP in follow-up.  Patient has a history of 02/2018 left iliofemoral embolectomy, common femoral endarterectomy with GSV patch angioplasty.        The pt is *** on a statin for cholesterol management.  The pt is *** on a daily aspirin.   Other AC:  *** The pt is *** on medication for hypertension.   The pt is *** diabetic.  Tobacco hx:  ***  Past Medical History:  Diagnosis Date   Arthritis    Cancer (HCC)    breast   Diabetes mellitus without complication (HCC)    DVT (deep venous thrombosis) (HCC)     Past Surgical History:  Procedure Laterality Date   FEMORAL-FEMORAL BYPASS GRAFT Left 03/22/2018   Procedure: LEFT ILIAC ARTERTY EMBOLECTOMY;  Surgeon: Chuck Hint, MD;  Location: [facility] OR;  Service: Vascular;  Laterality: Left;   INTRAOPERATIVE ARTERIOGRAM Left 03/22/2018   Procedure: Intra Operative Arteriogram LEFT LOWER LEG;  Surgeon: Chuck Hint, MD;  Location: [facility] OR;  Service: Vascular;  Laterality: Left;   PATCH ANGIOPLASTY Left 03/22/2018   Procedure: PATCH ANGIOPLASTY OF LEFT FEMORAL ARTERY;  Surgeon: Chuck Hint, MD;  Location: [facility] OR;  Service: Vascular;  Laterality: Left;    Social History   Socioeconomic History   Marital status: Married    Spouse name: Not on file   Number of children: Not on file   Years of education: Not on file   Highest education level: Not on file  Occupational History   Not on file  Tobacco Use   Smoking status: Every Day    Current packs/day: 0.50    Average packs/day: 0.5 packs/day for 27.0 years (13.5 ttl pk-yrs)    Types: Cigarettes   Smokeless tobacco: Never   Tobacco comments:    Down to 4 cigarettes per day.  Trying to quit.  04/26/2022 hfb  Vaping Use   Vaping status: Never Used  Substance  and Sexual Activity   Alcohol use: Yes    Comment: "During the holidays"    Drug use: Yes    Types: Marijuana    Comment: Everyother day    Sexual activity: Not on file  Other Topics Concern   Not on file  Social History Narrative   Not on file   Social Drivers of Health   Financial Resource Strain: High Risk (12/10/2022)   Received from [facility]   Overall Financial Resource Strain (CARDIA)    Difficulty of Paying Living Expenses: Very hard  Food Insecurity: Food Insecurity Present (12/10/2022)   Received from [facility]   Hunger Vital Sign    Worried About Running Out of Food in the Last Year: Often true    Ran Out of Food in the Last Year: Often true  Transportation Needs: Unmet Transportation Needs (12/10/2022)   Received from [facility] - Transportation    Lack of Transportation (Medical): Yes    Lack of Transportation (Non-Medical): Yes  Physical Activity: Unknown (12/10/2022)   Received from [facility]   Exercise Vital Sign    Days of Exercise per Week: 2 days    Minutes of Exercise per Session: Not on file  Stress: Stress Concern Present (12/10/2022)   Received from [facility] -  Occupational Stress Questionnaire    Feeling of Stress : Very much  Social Connections: Somewhat Isolated (12/10/2022)   Received from [facility]   Social Network    How would you rate your social network (family, work, friends)?: Restricted participation with some degree of social isolation  Intimate Partner Violence: Not At Risk (12/10/2022)   Received from [facility]   HITS    Over the last 12 months how often did your partner physically hurt you?: Never    Over the last 12 months how often did your partner insult you or talk down to you?: Never    Over the last 12 months how often did your partner threaten you with physical harm?: Never    Over the last 12 months how often did your partner scream or curse at  you?: Never   ***No family history on file.  Current Outpatient Medications  Medication Sig Dispense Refill   Cyanocobalamin (VITAMIN B 12 PO) Take 1 tablet by mouth daily.     glipiZIDE (GLUCOTROL) 5 MG tablet Take 5 mg by mouth 2 (two) times daily.     meloxicam (MOBIC) 15 MG tablet TAKE 1 TABLET (15 MG TOTAL) BY MOUTH DAILY. 30 tablet 3   pantoprazole (PROTONIX) 40 MG tablet Take 1 tablet (40 mg total) by mouth daily. 30 tablet 0   TURMERIC PO Take 1 tablet by mouth daily.     No current facility-administered medications for this visit.    No Known Allergies   REVIEW OF SYSTEMS:  *** [X]  denotes positive finding, [ ]  denotes negative finding Cardiac  Comments:  Chest pain or chest pressure:    Shortness of breath upon exertion:    Short of breath when lying flat:    Irregular heart rhythm:        Vascular    Pain in calf, thigh, or hip brought on by ambulation:    Pain in feet at night that wakes you up from your sleep:     Blood clot in your veins:    Leg swelling:         Pulmonary    Oxygen at home:    Productive cough:     Wheezing:         Neurologic    Sudden weakness in arms or legs:     Sudden numbness in arms or legs:     Sudden onset of difficulty speaking or slurred speech:    Temporary loss of vision in one eye:     Problems with dizziness:         Gastrointestinal    Blood in stool:     Vomited blood:         Genitourinary    Burning when urinating:     Blood in urine:        Psychiatric    Major depression:         Hematologic    Bleeding problems:    Problems with blood clotting too easily:        Skin    Rashes or ulcers:        Constitutional    Fever or chills:      PHYSICAL EXAMINATION:  There were no vitals filed for this visit.  General:  WDWN in NAD; vital signs documented above Gait: Not observed HENT: WNL, normocephalic Pulmonary: normal non-labored breathing , without wheezing Cardiac: {Desc; regular/irreg:14544}  HR Abdomen: soft, NT, no masses Skin: {With/Without:20273} rashes Vascular Exam/Pulses:  Right  Left  Radial {Exam; arterial pulse strength 0-4:30167} {Exam; arterial pulse strength 0-4:30167}  Ulnar {Exam; arterial pulse strength 0-4:30167} {Exam; arterial pulse strength 0-4:30167}  Femoral {Exam; arterial pulse strength 0-4:30167} {Exam; arterial pulse strength 0-4:30167}  Popliteal {Exam; arterial pulse strength 0-4:30167} {Exam; arterial pulse strength 0-4:30167}  DP {Exam; arterial pulse strength 0-4:30167} {Exam; arterial pulse strength 0-4:30167}  PT {Exam; arterial pulse strength 0-4:30167} {Exam; arterial pulse strength 0-4:30167}   Extremities: {With/Without:20273} ischemic changes, {With/Without:20273} Gangrene , {With/Without:20273} cellulitis; {With/Without:20273} open wounds;  Musculoskeletal: no muscle wasting or atrophy  Neurologic: A&O X 3;  No focal weakness or paresthesias are detected Psychiatric:  The pt has {Desc; normal/abnormal:11317::"Normal"} affect.   Non-Invasive Vascular Imaging:   ***    ASSESSMENT/PLAN: Gissele Narducci is a 48 y.o. female presenting with ***   ***   Victorino Sparrow, MD [facility] [phone]

## 2023-04-02 ENCOUNTER — Ambulatory Visit (HOSPITAL_COMMUNITY): Payer: MEDICAID | Attending: Vascular Surgery

## 2023-04-02 ENCOUNTER — Ambulatory Visit (HOSPITAL_COMMUNITY): Payer: MEDICAID

## 2023-04-03 ENCOUNTER — Ambulatory Visit: Payer: MEDICAID | Admitting: Vascular Surgery

## 2023-04-17 ENCOUNTER — Ambulatory Visit: Payer: MEDICAID | Admitting: Primary Care

## 2023-05-26 ENCOUNTER — Telehealth: Payer: Self-pay

## 2023-05-26 NOTE — Progress Notes (Deleted)
 Office Note     CC:  left leg claudication Requesting Provider:  Nita Bast, NP  HPI: Megan Riddle is a 48 y.o. (Dec 06, 1975) female presenting at the request of .Nita Bast, NP ***  The pt is *** on a statin for cholesterol management.  The pt is *** on a daily aspirin.   Other AC:  *** The pt is *** on medication for hypertension.   The pt is *** diabetic.  Tobacco hx:  ***  Past Medical History:  Diagnosis Date   Arthritis    Cancer (HCC)    breast   Diabetes mellitus without complication (HCC)    DVT (deep venous thrombosis) (HCC)     Past Surgical History:  Procedure Laterality Date   FEMORAL-FEMORAL BYPASS GRAFT Left 03/22/2018   Procedure: LEFT ILIAC ARTERTY EMBOLECTOMY;  Surgeon: Dannis Dy, MD;  Location: [facility] OR;  Service: Vascular;  Laterality: Left;   INTRAOPERATIVE ARTERIOGRAM Left 03/22/2018   Procedure: Intra Operative Arteriogram LEFT LOWER LEG;  Surgeon: Dannis Dy, MD;  Location: [facility] OR;  Service: Vascular;  Laterality: Left;   PATCH ANGIOPLASTY Left 03/22/2018   Procedure: PATCH ANGIOPLASTY OF LEFT FEMORAL ARTERY;  Surgeon: Dannis Dy, MD;  Location: [facility] OR;  Service: Vascular;  Laterality: Left;    Social History   Socioeconomic History   Marital status: Married    Spouse name: Not on file   Number of children: Not on file   Years of education: Not on file   Highest education level: Not on file  Occupational History   Not on file  Tobacco Use   Smoking status: Every Day    Current packs/day: 0.50    Average packs/day: 0.5 packs/day for 27.0 years (13.5 ttl pk-yrs)    Types: Cigarettes   Smokeless tobacco: Never   Tobacco comments:    Down to 4 cigarettes per day.  Trying to quit.  04/26/2022 hfb  Vaping Use   Vaping status: Never Used  Substance and Sexual Activity   Alcohol use: Yes    Comment: "During the holidays"    Drug use: Yes    Types: Marijuana    Comment: Everyother day    Sexual  activity: Not on file  Other Topics Concern   Not on file  Social History Narrative   Not on file   Social Drivers of Health   Financial Resource Strain: High Risk (05/12/2023)   Received from [facility]   Overall Financial Resource Strain (CARDIA)    Difficulty of Paying Living Expenses: Very hard  Food Insecurity: Food Insecurity Present (05/12/2023)   Received from [facility]   Hunger Vital Sign    Worried About Running Out of Food in the Last Year: Often true    Ran Out of Food in the Last Year: Sometimes true  Transportation Needs: No Transportation Needs (05/12/2023)   Received from [facility] - Transportation    Lack of Transportation (Medical): No    Lack of Transportation (Non-Medical): No  Physical Activity: Inactive (05/12/2023)   Received from [facility]   Exercise Vital Sign    Days of Exercise per Week: 2 days    Minutes of Exercise per Session: 0 min  Stress: Stress Concern Present (05/12/2023)   Received from [facility] - Occupational Stress Questionnaire    Feeling of Stress : Very much  Social Connections: Somewhat Isolated (05/12/2023)   Received from [facility]  Social Network    How would you rate your social network (family, work, friends)?: Restricted participation with some degree of social isolation  Intimate Partner Violence: Not At Risk (05/12/2023)   Received from [facility]   HITS    Over the last 12 months how often did your partner physically hurt you?: Never    Over the last 12 months how often did your partner insult you or talk down to you?: Never    Over the last 12 months how often did your partner threaten you with physical harm?: Never    Over the last 12 months how often did your partner scream or curse at you?: Never   ***No family history on file.  Current Outpatient Medications  Medication Sig Dispense Refill   Cyanocobalamin (VITAMIN B 12 PO) Take 1 tablet by  mouth daily.     glipiZIDE  (GLUCOTROL ) 5 MG tablet Take 5 mg by mouth 2 (two) times daily.     meloxicam  (MOBIC ) 15 MG tablet TAKE 1 TABLET (15 MG TOTAL) BY MOUTH DAILY. 30 tablet 3   pantoprazole  (PROTONIX ) 40 MG tablet Take 1 tablet (40 mg total) by mouth daily. 30 tablet 0   TURMERIC PO Take 1 tablet by mouth daily.     No current facility-administered medications for this visit.    No Known Allergies   REVIEW OF SYSTEMS:  *** [X]  denotes positive finding, [ ]  denotes negative finding Cardiac  Comments:  Chest pain or chest pressure:    Shortness of breath upon exertion:    Short of breath when lying flat:    Irregular heart rhythm:        Vascular    Pain in calf, thigh, or hip brought on by ambulation:    Pain in feet at night that wakes you up from your sleep:     Blood clot in your veins:    Leg swelling:         Pulmonary    Oxygen at home:    Productive cough:     Wheezing:         Neurologic    Sudden weakness in arms or legs:     Sudden numbness in arms or legs:     Sudden onset of difficulty speaking or slurred speech:    Temporary loss of vision in one eye:     Problems with dizziness:         Gastrointestinal    Blood in stool:     Vomited blood:         Genitourinary    Burning when urinating:     Blood in urine:        Psychiatric    Major depression:         Hematologic    Bleeding problems:    Problems with blood clotting too easily:        Skin    Rashes or ulcers:        Constitutional    Fever or chills:      PHYSICAL EXAMINATION:  There were no vitals filed for this visit.  General:  WDWN in NAD; vital signs documented above Gait: Not observed HENT: WNL, normocephalic Pulmonary: normal non-labored breathing , without wheezing Cardiac: {Desc; regular/irreg:14544} HR Abdomen: soft, NT, no masses Skin: {With/Without:20273} rashes Vascular Exam/Pulses:  Right Left  Radial {Exam; arterial pulse strength 0-4:30167} {Exam;  arterial pulse strength 0-4:30167}  Ulnar {Exam; arterial pulse strength 0-4:30167} {Exam; arterial pulse strength 0-4:30167}  Femoral {Exam; arterial pulse strength 0-4:30167} {Exam; arterial pulse strength 0-4:30167}  Popliteal {Exam; arterial pulse strength 0-4:30167} {Exam; arterial pulse strength 0-4:30167}  DP {Exam; arterial pulse strength 0-4:30167} {Exam; arterial pulse strength 0-4:30167}  PT {Exam; arterial pulse strength 0-4:30167} {Exam; arterial pulse strength 0-4:30167}   Extremities: {With/Without:20273} ischemic changes, {With/Without:20273} Gangrene , {With/Without:20273} cellulitis; {With/Without:20273} open wounds;  Musculoskeletal: no muscle wasting or atrophy  Neurologic: A&O X 3;  No focal weakness or paresthesias are detected Psychiatric:  The pt has {Desc; normal/abnormal:11317::"Normal"} affect.   Non-Invasive Vascular Imaging:   ***    ASSESSMENT/PLAN: Adabelle Owensby is a 48 y.o. female presenting with ***   ***   Kayla Part, MD [facility] [phone]

## 2023-05-26 NOTE — Telephone Encounter (Signed)
 I called and spoke to [employer]. They sent me a compliance report and tagged our office in [place]. NFN.   Pt has an appointment with Irby Mannan, NP tomorrow for CPAP compliance.

## 2023-05-27 ENCOUNTER — Encounter: Payer: Self-pay | Admitting: Primary Care

## 2023-05-27 ENCOUNTER — Ambulatory Visit: Payer: MEDICAID | Admitting: Primary Care

## 2023-05-27 VITALS — BP 129/84 | HR 75 | Temp 98.5°F | Ht 62.0 in | Wt 195.0 lb

## 2023-05-27 DIAGNOSIS — F17210 Nicotine dependence, cigarettes, uncomplicated: Secondary | ICD-10-CM | POA: Diagnosis not present

## 2023-05-27 DIAGNOSIS — G4733 Obstructive sleep apnea (adult) (pediatric): Secondary | ICD-10-CM

## 2023-05-27 DIAGNOSIS — J309 Allergic rhinitis, unspecified: Secondary | ICD-10-CM | POA: Diagnosis not present

## 2023-05-27 NOTE — Patient Instructions (Signed)
-  OBSTRUCTIVE SLEEP APNEA: Obstructive sleep apnea is a condition where your breathing repeatedly stops and starts during sleep due to blocked airways. We adjusted your CPAP machine settings to a minimum pressure of 4 and a maximum of 15 to help with your symptoms of dryness, cough, and discomfort. Please follow up in 2-4 weeks via message to let us  know how you are doing. If problems continue, we may need to do a formal CPAP titration study in the lab. Make sure you have a new mask and enough filters for your CPAP machine.  -ALLERGIC RHINITIS: Allergic rhinitis is an allergic reaction that causes nasal congestion, runny nose, and postnasal drip. We recommend switching from Claritin to Zyrtec for better relief. Additionally, use over-the-counter Flonase (fluticasone) nasal spray to help with your symptoms. If these measures do not help, we may consider prescribing Astelin nasal spray.  INSTRUCTIONS: Let me know how you're doing in 2-4 weeks via message to assess your CPAP tolerance and effectiveness. If issues persist, we may recommend a formal CPAP titration study in the lab.  Follow-up 3 months with Beth NP for CPAP compliance- Friday virtual visit ok

## 2023-05-27 NOTE — Progress Notes (Signed)
 @Patient  ID: Megan Riddle, female    DOB: [birth year], 48 y.o.   MRN: [id number]  Chief Complaint  Patient presents with   Follow-up    CPAP F/U    Referring provider: Nita Bast, NP  HPI: 47 year old female, current every day smoker. PMH significant for sleep apnea, hyperlipidemia, type 2 diabetes, obesity, PTSD, nicotine  dependence.   Previous [facility] pulmonary encounter: 04/26/2022 Patient presents today for sleep consult.  Dx with sleep apnea in 2013. PSG in May 2018, AHI 25.3/hour with Spo2 low 84%.  She has been off CPAP for 1.5 years. She lost her machine in storage facility. She had lost weight and symptoms improved but she has slowly regained some weight. She has snoring symptoms and has been told by by her son that she stops breathing in her sleep. She will at times wake herself up gasping for air. She is a side sleeper. Typical bedtime is between 8-9pm. It takes her an hour to fall asleep. She wakes up to 4 times a night. She starts her day at 6am.  Denies symptoms of narcolepsy, cataplexy, sleepwalking.   She is a current smoker, cut down to 4 cigarettes a day. She has picked a quit date for this Sunday.    01/10/2023 Patient contacted today for virtual visit to review sleep study results.  She had a previous visit scheduled in November which patient no showed.   Dx with sleep apnea in 2013. PSG in May 2018, AHI 25.3/hour with Spo2 low 84%.  She has been off CPAP for 1.5 years. She lost her machine in storage facility. She had lost weight and symptoms improved but she has slowly regained some weight. She has snoring symptoms and has been told by by her son that she stops breathing in her sleep.  Polysomnography in May 2024 showed moderate obstructive sleep apnea with an AHI of 24.3 and SpO2 low of 80%. Significant REM effect. She slept mostly in the non-supine position. Treatment options recommended CPAP, oral appliance or surgical risk assessment. We reviewed sleep study  today, risks of untreated sleep apnea and treatment options.  Patient is open to resuming CPAP therapy.  She reports preference to nasal pillow mask over a full face mask.  Advised patient aim to wear CPAP nightly for 4 to 6 hours or longer.  Advised patient continue weight loss efforts.   Obstructive Sleep Apnea  -History of sleep apnea, previously on CPAP but stopped over a year and a half ago.  Repeat sleep study in May 2024 showed moderate obstructive sleep apnea, AHI 24.3/h with SpO2 low 80%.  Reviewed sleep study results today and treatment options.  Patient in agreement to restarting CPAP.  DME order placed for auto CPAP 5 to 15 cm H2O with mask choice, patient prefers nasal pillow mask.  Advised patient aim to wear CPAP nightly for minimum 4 to 6 hours or longer.  Encouraged weight loss efforts as able.  Advised against driving if experiencing excessive daytime sleepiness or fatigue.   05/27/2023 - Interim hx  Discussed the use of AI scribe software for clinical note transcription with the patient, who gave verbal consent to proceed.  History of Present Illness   Megan Riddle is a 48 year old female with moderate obstructive sleep apnea who presents for follow-up regarding CPAP therapy management.  Diagnosed with sleep apnea in 2013, she underwent a repeat sleep study in 2018 showing moderate to severe sleep apnea with 25 apneic events per hour. She under  went a subsequent study in May 2024 confirmed moderate sleep apnea with an AHI of 24, leading to the resumption of CPAP therapy in December 2024 after a break due to losing her machine and some weight loss.  She reports issues with her new CPAP machine, noting incorrect pressure settings. She experiences a persistent cough and dryness, attributing these symptoms to the machine's settings. Attempts to adjust the settings to auto resulted in air pressure that was too high. Current auto setting auto with min pressure 4cm h20 and max  pressure 4cm h20.   She also reports a cracked water chamber in the CPAP machine, causing leakage and delaying her use of the machine for over three weeks while waiting for a replacement part due to delivery issues. She expresses concern about using the machine due to leakage and electrical safety.  She uses nasal pillows with her CPAP and reports nasal congestion, postnasal drip, and red, puffy eyes in the morning. She currently takes Claritin for allergies but has previously used Zyrtec. She is not using any nasal spray but reports a runny nose and postnasal drip.  The cough and throat discomfort began after using the machine with the cracked water chamber, suggesting improper humidification.      [facility] download 03/12/2023 - 05/24/2023 Usage 29/74 days (34%) Average usage days used 2 hours 25 minutes Pressure 4-4 cm H2O Air leak 17.3 L/min AHI 1.5 No Known Allergies  Immunization History  Administered Date(s) Administered   Fluad Quad(high Dose 65+) 01/13/2017   Influenza Inj Mdck Quad Pf 10/17/2021   Influenza, High Dose Seasonal PF 03/24/2018   Influenza,inj,Quad PF,6+ Mos 10/02/2015, 01/13/2017, 03/24/2018   Influenza-Unspecified 11/27/2012, 10/02/2015   PFIZER(Purple Top)SARS-COV-2 Vaccination 09/19/2019, 10/15/2019   Pneumococcal Polysaccharide-23 11/27/2012    Past Medical History:  Diagnosis Date   Arthritis    Cancer (HCC)    breast   Diabetes mellitus without complication (HCC)    DVT (deep venous thrombosis) (HCC)     Tobacco History: Social History   Tobacco Use  Smoking Status Every Day   Current packs/day: 0.50   Average packs/day: 0.5 packs/day for 27.0 years (13.5 ttl pk-yrs)   Types: Cigarettes  Smokeless Tobacco Never  Tobacco Comments   Down to 7 cigarettes per day.  Trying to quit.  05-27-23 AB, CMA   Ready to quit: Not Answered Counseling given: Not Answered Tobacco comments: Down to 7 cigarettes per day.  Trying to quit.  05-27-23 AB,  CMA   Outpatient Medications Prior to Visit  Medication Sig Dispense Refill   Cyanocobalamin (VITAMIN B 12 PO) Take 1 tablet by mouth daily.     glipiZIDE  (GLUCOTROL ) 5 MG tablet Take 5 mg by mouth 2 (two) times daily.     meloxicam  (MOBIC ) 15 MG tablet TAKE 1 TABLET (15 MG TOTAL) BY MOUTH DAILY. 30 tablet 3   pantoprazole  (PROTONIX ) 40 MG tablet Take 1 tablet (40 mg total) by mouth daily. 30 tablet 0   TURMERIC PO Take 1 tablet by mouth daily.     No facility-administered medications prior to visit.      Review of Systems  Review of Systems  Constitutional: Negative.   HENT:  Positive for postnasal drip.   Respiratory: Negative.    Cardiovascular: Negative.      Physical Exam  BP 129/84 (BP Location: Right Arm, Patient Position: Sitting, Cuff Size: Large)   Pulse 75   Temp 98.5 F (36.9 C) (Oral)   Ht 5\' 2"  (1.575 m)  Wt 195 lb (88.5 kg)   LMP 12/20/2017 (Approximate) Comment: " I don't even remember".   SpO2 98%   BMI 35.67 kg/m  Physical Exam Constitutional:      Appearance: Normal appearance.  HENT:     Head: Normocephalic and atraumatic.  Cardiovascular:     Rate and Rhythm: Normal rate.  Pulmonary:     Effort: Pulmonary effort is normal.     Breath sounds: Normal breath sounds. No wheezing or rhonchi.  Musculoskeletal:        General: Normal range of motion.  Skin:    General: Skin is warm and dry.  Neurological:     General: No focal deficit present.     Mental Status: She is alert and oriented to person, place, and time. Mental status is at baseline.  Psychiatric:        Mood and Affect: Mood normal.        Behavior: Behavior normal.        Thought Content: Thought content normal.        Judgment: Judgment normal.      Lab Results:  CBC    Component Value Date/Time   WBC 11.5 (H) 02/20/2023 0934   RBC 3.72 (L) 02/20/2023 0934   HGB 12.5 02/20/2023 0934   HCT 35.8 (L) 02/20/2023 0934   PLT 270 02/20/2023 0934   MCV 96.2 02/20/2023 0934    MCH 33.6 02/20/2023 0934   MCHC 34.9 02/20/2023 0934   RDW 11.8 02/20/2023 0934   LYMPHSABS 3.3 02/20/2023 0934   MONOABS 0.6 02/20/2023 0934   EOSABS 0.1 02/20/2023 0934   BASOSABS 0.1 02/20/2023 0934    BMET    Component Value Date/Time   NA 137 02/20/2023 0934   K 2.9 (L) 02/20/2023 0934   CL 113 (H) 02/20/2023 0934   CO2 18 (L) 02/20/2023 0934   GLUCOSE 225 (H) 02/20/2023 0934   BUN <5 (L) 02/20/2023 0934   CREATININE 0.40 (L) 02/20/2023 0934   CALCIUM 7.1 (L) 02/20/2023 0934   GFRNONAA >60 02/20/2023 0934   GFRAA >60 06/19/2019 1411    BNP No results found for: "BNP"  ProBNP No results found for: "PROBNP"  Imaging: No results found.   Assessment & Plan:   No problem-specific Assessment & Plan notes found for this encounter.  Assessment and Plan    Obstructive Sleep Apnea Moderate obstructive sleep apnea with inadequate CPAP settings causing dryness, cough, and discomfort. Cracked water chamber affecting humidification, replaced recently by DME company. Current settings min pressure 4cm h20- max pressure 4cm h20. - Adjust CPAP pressure settings to minimum 4 and maximum 12 for auto-adjustment. - Encourage patient work on compliance, aim to wear CPAP nightly 4-6 hours  - Follow up in 2-4 weeks via message to assess CPAP tolerance and effectiveness. - If issues persist, recommend formal CPAP titration study in lab. - Ensure she has new mask and sufficient filters for CPAP use.  Allergic Rhinitis Nasal congestion, rhinorrhea, postnasal drip, and conjunctival injection possibly exacerbated by CPAP use and/or seasonal allergies. Claritin may be insufficient. - Switch from Claritin to Zyrtec for enhanced antihistamine effect. - Use over-the-counter Flonase (fluticasone) nasal spray for additional symptom relief. - If symptoms persist, consider prescribing Astelin nasal spray.      Antonio Baumgarten, NP 05/27/2023

## 2023-05-29 ENCOUNTER — Ambulatory Visit (HOSPITAL_COMMUNITY): Admission: RE | Admit: 2023-05-29 | Payer: MEDICAID | Source: Ambulatory Visit

## 2023-05-29 ENCOUNTER — Ambulatory Visit (HOSPITAL_COMMUNITY): Payer: MEDICAID | Attending: Vascular Surgery

## 2023-05-29 ENCOUNTER — Ambulatory Visit: Payer: MEDICAID | Attending: Vascular Surgery | Admitting: Vascular Surgery

## 2023-06-11 ENCOUNTER — Telehealth: Payer: Self-pay | Admitting: Primary Care

## 2023-06-11 NOTE — Telephone Encounter (Signed)
 PT states the CPAP pressure needs to be adj. It is drying out her nose. Her # s [phone]

## 2023-06-12 ENCOUNTER — Ambulatory Visit: Payer: Self-pay

## 2023-06-12 NOTE — Telephone Encounter (Signed)
  Chief Complaint: info only/CPAP settings Additional Notes: Pt calling requesting CPAP setting adjustment. Pt reports current settings are on "auto". Pt c/o dryness and cough and would like to adjust humidification. Pt endorses that this is the second time requesting adjustment.    Copied from CRM [id number]. Topic: Clinical - Red Word Triage >> Jun 12, 2023  3:50 PM Ilene Malling wrote: Red Word that prompted transfer to Nurse Triage: Patient [phone] states CPAP machine is giving too much air and not enough humidifer which makes it difficult to put on and keep on. Patient states dryness in nose and throat, causing patient to cough. Patient was told this could be adjusted. Please advise. Reason for Disposition  [1] Caller requesting NON-URGENT health information AND [2] PCP's office is the best resource  Answer Assessment - Initial Assessment Questions 1. REASON FOR CALL or QUESTION: "What is your reason for calling today?" or "How can I best help you?" or "What question do you have that I can help answer?"     Pt reports needing CPAP settings adjusted Started using CPAP x 2-3 month, has been noticing coughing, dryness > 2 weeks-- wanting guidance on CPAP settings adjustment for humidification.  Protocols used: Information Only Call - No Triage-A-AH

## 2023-06-13 NOTE — Telephone Encounter (Signed)
 Can you get me a download? We may be able to lower the max pressure. She should also see if she can increase humidification settings on her CPAP and she can use AYR nasal gel or saline spray before bed.

## 2023-06-13 NOTE — Telephone Encounter (Signed)
 It looks like the pressure settings are 4-15 cm h20. Megan Riddle, can we adjust this setting?

## 2023-06-13 NOTE — Telephone Encounter (Signed)
 I printed a compliance report for the pt and this is on your desk. I wanted you to review the report before I called the pt incase you had anymore information that needed to be relayed to the pt.

## 2023-06-13 NOTE — Telephone Encounter (Signed)
 I called and spoke to pt. Pt informed of Beth's notes and verbalized understanding. I informed pt that an order was placed for the setting change so if she has issues, then to call her DME company to help with that. Pt verbalized understanding. NFN

## 2023-06-13 NOTE — Telephone Encounter (Signed)
 Duplicate

## 2023-06-13 NOTE — Telephone Encounter (Signed)
 I placed an order for pressure settings to be changed during her visit in April but does not look like it was done  I would like her CPAP pressure to be 4-12cm h20  Also advise her on humidification settings nad AYR nasal gel or saline

## 2023-06-20 ENCOUNTER — Encounter (HOSPITAL_COMMUNITY): Payer: MEDICAID

## 2023-06-27 ENCOUNTER — Encounter: Payer: Self-pay | Admitting: Primary Care

## 2023-07-08 ENCOUNTER — Ambulatory Visit (HOSPITAL_COMMUNITY): Admission: RE | Admit: 2023-07-08 | Payer: MEDICAID | Source: Ambulatory Visit

## 2023-07-08 ENCOUNTER — Ambulatory Visit (HOSPITAL_COMMUNITY): Payer: MEDICAID

## 2023-07-08 ENCOUNTER — Ambulatory Visit: Payer: MEDICAID | Admitting: Vascular Surgery

## 2023-08-08 ENCOUNTER — Ambulatory Visit (HOSPITAL_COMMUNITY): Admission: RE | Admit: 2023-08-08 | Payer: MEDICAID | Source: Ambulatory Visit

## 2023-08-19 ENCOUNTER — Ambulatory Visit: Payer: MEDICAID | Admitting: Vascular Surgery

## 2023-08-25 ENCOUNTER — Ambulatory Visit (HOSPITAL_COMMUNITY)
Admission: RE | Admit: 2023-08-25 | Discharge: 2023-08-25 | Disposition: A | Discharge status: Home / Self Care | Payer: MEDICAID | Source: Ambulatory Visit | Attending: Vascular Surgery | Admitting: Vascular Surgery

## 2023-08-25 ENCOUNTER — Ambulatory Visit (HOSPITAL_COMMUNITY): Admission: RE | Admit: 2023-08-25 | Payer: MEDICAID | Source: Ambulatory Visit

## 2023-08-25 DIAGNOSIS — I745 Embolism and thrombosis of iliac artery: Secondary | ICD-10-CM | POA: Diagnosis present

## 2023-08-25 LAB — VAS US ABI WITH/WO TBI
Left ABI: 1.14
Right ABI: 1.13

## 2023-08-25 NOTE — Progress Notes (Deleted)
 Patient name: Megan Riddle MRN: [id number] DOB: May 03, 1975 Sex: female  REASON FOR CONSULT: Evaluate intermittent left leg claudication  HPI: Megan Riddle is a 48 y.o. female, with history of diabetes that presents for evaluation of left leg claudication.  Well-known to vascular surgery and had a previous left iliofemoral thrombectomy with vein patch of the left common femoral artery using saphenous vein on 03/22/2018 by Dr. Eliza.  Past Medical History:  Diagnosis Date   Arthritis    Cancer (HCC)    breast   Diabetes mellitus without complication (HCC)    DVT (deep venous thrombosis) (HCC)     Past Surgical History:  Procedure Laterality Date   FEMORAL-FEMORAL BYPASS GRAFT Left 03/22/2018   Procedure: LEFT ILIAC ARTERTY EMBOLECTOMY;  Surgeon: Eliza Lonni RAMAN, MD;  Location: [facility] OR;  Service: Vascular;  Laterality: Left;   INTRAOPERATIVE ARTERIOGRAM Left 03/22/2018   Procedure: Intra Operative Arteriogram LEFT LOWER LEG;  Surgeon: Eliza Lonni RAMAN, MD;  Location: [facility] OR;  Service: Vascular;  Laterality: Left;   PATCH ANGIOPLASTY Left 03/22/2018   Procedure: PATCH ANGIOPLASTY OF LEFT FEMORAL ARTERY;  Surgeon: Eliza Lonni RAMAN, MD;  Location: [facility] OR;  Service: Vascular;  Laterality: Left;    No family history on file.  SOCIAL HISTORY: Social History   Socioeconomic History   Marital status: Married    Spouse name: Not on file   Number of children: Not on file   Years of education: Not on file   Highest education level: Not on file  Occupational History   Not on file  Tobacco Use   Smoking status: Every Day    Current packs/day: 0.50    Average packs/day: 0.5 packs/day for 27.0 years (13.5 ttl pk-yrs)    Types: Cigarettes   Smokeless tobacco: Never   Tobacco comments:    Down to 7 cigarettes per day.  Trying to quit.  05-27-23 AB, CMA  Vaping Use   Vaping status: Never Used  Substance and Sexual Activity   Alcohol use: Yes    Comment:  During the holidays    Drug use: Yes    Types: Marijuana    Comment: Everyother day    Sexual activity: Not on file  Other Topics Concern   Not on file  Social History Narrative   Not on file   Social Drivers of Health   Financial Resource Strain: High Risk (05/12/2023)   Received from [facility]   Overall Financial Resource Strain (CARDIA)    Difficulty of Paying Living Expenses: Very hard  Food Insecurity: Food Insecurity Present (05/12/2023)   Received from [facility]   Hunger Vital Sign    Within the past 12 months, you worried that your food would run out before you got the money to buy more.: Often true    Within the past 12 months, the food you bought just didn't last and you didn't have money to get more.: Sometimes true  Transportation Needs: No Transportation Needs (05/12/2023)   Received from [facility] - Transportation    Lack of Transportation (Medical): No    Lack of Transportation (Non-Medical): No  Physical Activity: Inactive (05/12/2023)   Received from [facility]   Exercise Vital Sign    On average, how many days per week do you engage in moderate to strenuous exercise (like a brisk walk)?: 2 days    On average, how many minutes do you engage in exercise at this level?: 0 min  Stress: Stress  Concern Present (05/12/2023)   Received from [facility] of Occupational Health - Occupational Stress Questionnaire    Feeling of Stress : Very much  Social Connections: Somewhat Isolated (05/12/2023)   Received from [facility]   Social Network    How would you rate your social network (family, work, friends)?: Restricted participation with some degree of social isolation  Intimate Partner Violence: Not At Risk (05/12/2023)   Received from [facility]   HITS    Over the last 12 months how often did your partner physically hurt you?: Never    Over the last 12 months how often did your partner insult you or talk down to you?:  Never    Over the last 12 months how often did your partner threaten you with physical harm?: Never    Over the last 12 months how often did your partner scream or curse at you?: Never    No Known Allergies  Current Outpatient Medications  Medication Sig Dispense Refill   Cyanocobalamin (VITAMIN B 12 PO) Take 1 tablet by mouth daily.     glipiZIDE  (GLUCOTROL ) 5 MG tablet Take 5 mg by mouth 2 (two) times daily.     meloxicam  (MOBIC ) 15 MG tablet TAKE 1 TABLET (15 MG TOTAL) BY MOUTH DAILY. 30 tablet 3   pantoprazole  (PROTONIX ) 40 MG tablet Take 1 tablet (40 mg total) by mouth daily. 30 tablet 0   TURMERIC PO Take 1 tablet by mouth daily.     No current facility-administered medications for this visit.    REVIEW OF SYSTEMS:  [X]  denotes positive finding, [ ]  denotes negative finding Cardiac  Comments:  Chest pain or chest pressure: ***   Shortness of breath upon exertion:    Short of breath when lying flat:    Irregular heart rhythm:        Vascular    Pain in calf, thigh, or hip brought on by ambulation:    Pain in feet at night that wakes you up from your sleep:     Blood clot in your veins:    Leg swelling:         Pulmonary    Oxygen at home:    Productive cough:     Wheezing:         Neurologic    Sudden weakness in arms or legs:     Sudden numbness in arms or legs:     Sudden onset of difficulty speaking or slurred speech:    Temporary loss of vision in one eye:     Problems with dizziness:         Gastrointestinal    Blood in stool:     Vomited blood:         Genitourinary    Burning when urinating:     Blood in urine:        Psychiatric    Major depression:         Hematologic    Bleeding problems:    Problems with blood clotting too easily:        Skin    Rashes or ulcers:        Constitutional    Fever or chills:      PHYSICAL EXAM: There were no vitals filed for this visit.  GENERAL: The patient is a well-nourished female, in no acute  distress. The vital signs are documented above. CARDIAC: There is a regular rate and rhythm.  VASCULAR: *** PULMONARY:  There is good air exchange bilaterally without wheezing or rales. ABDOMEN: Soft and non-tender with normal pitched bowel sounds.  MUSCULOSKELETAL: There are no major deformities or cyanosis. NEUROLOGIC: No focal weakness or paresthesias are detected. SKIN: There are no ulcers or rashes noted. PSYCHIATRIC: The patient has a normal affect.  DATA:   ***  Assessment/Plan:   48 y.o. female, with history of diabetes that presents for evaluation of left leg claudication.  Well-known to vascular surgery and had a previous left iliofemoral thrombectomy with vein patch of the left common femoral artery using saphenous vein on 03/22/2018 by Dr. Eliza.   Lonni DOROTHA Gaskins, MD [facility] [place] Office: [phone]

## 2023-08-26 ENCOUNTER — Ambulatory Visit: Payer: MEDICAID | Attending: Vascular Surgery | Admitting: Vascular Surgery

## 2023-09-04 ENCOUNTER — Telehealth: Payer: Self-pay

## 2023-09-04 NOTE — Telephone Encounter (Signed)
 I called [employer]. Pt is listed as a pick up because she was not compliant with the CPAP machine. July 27th until now shows no usage. Pt's insurance has put in a pick up for the machine. NFN

## 2023-09-05 ENCOUNTER — Telehealth (INDEPENDENT_AMBULATORY_CARE_PROVIDER_SITE_OTHER): Payer: MEDICAID | Admitting: Primary Care

## 2023-09-05 DIAGNOSIS — G4733 Obstructive sleep apnea (adult) (pediatric): Secondary | ICD-10-CM | POA: Diagnosis not present

## 2023-09-05 DIAGNOSIS — Z87891 Personal history of nicotine dependence: Secondary | ICD-10-CM | POA: Diagnosis not present

## 2023-09-05 NOTE — Progress Notes (Signed)
 Virtual Visit via Video Note  I connected with Megan Riddle on 09/05/23 at  1:30 PM EDT by a video enabled telemedicine application and verified that I am speaking with the correct person using two identifiers.  Location: Patient: Home Provider: Office    I discussed the limitations of evaluation and management by telemedicine and the availability of in person appointments. The patient expressed understanding and agreed to proceed.  History of Present Illness: 48 year old female, current every day smoker. PMH significant for sleep apnea, hyperlipidemia, type 2 diabetes, obesity, PTSD, nicotine  dependence.   Previous [facility] pulmonary encounter: 04/26/2022 Patient presents today for sleep consult.  Dx with sleep apnea in 2013. PSG in May 2018, AHI 25.3/hour with Spo2 low 84%.  She has been off CPAP for 1.5 years. She lost her machine in storage facility. She had lost weight and symptoms improved but she has slowly regained some weight. She has snoring symptoms and has been told by by her son that she stops breathing in her sleep. She will at times wake herself up gasping for air. She is a side sleeper. Typical bedtime is between 8-9pm. It takes her an hour to fall asleep. She wakes up to 4 times a night. She starts her day at 6am.  Denies symptoms of narcolepsy, cataplexy, sleepwalking.   She is a current smoker, cut down to 4 cigarettes a day. She has picked a quit date for this Sunday.    01/10/2023 Patient contacted today for virtual visit to review sleep study results.  She had a previous visit scheduled in November which patient no showed.   Dx with sleep apnea in 2013. PSG in May 2018, AHI 25.3/hour with Spo2 low 84%.  She has been off CPAP for 1.5 years. She lost her machine in storage facility. She had lost weight and symptoms improved but she has slowly regained some weight. She has snoring symptoms and has been told by by her son that she stops breathing in her  sleep.  Polysomnography in May 2024 showed moderate obstructive sleep apnea with an AHI of 24.3 and SpO2 low of 80%. Significant REM effect. She slept mostly in the non-supine position. Treatment options recommended CPAP, oral appliance or surgical risk assessment. We reviewed sleep study today, risks of untreated sleep apnea and treatment options.  Patient is open to resuming CPAP therapy.  She reports preference to nasal pillow mask over a full face mask.  Advised patient aim to wear CPAP nightly for 4 to 6 hours or longer.  Advised patient continue weight loss efforts.   Obstructive Sleep Apnea  -History of sleep apnea, previously on CPAP but stopped over a year and a half ago.  Repeat sleep study in May 2024 showed moderate obstructive sleep apnea, AHI 24.3/h with SpO2 low 80%.  Reviewed sleep study results today and treatment options.  Patient in agreement to restarting CPAP.  DME order placed for auto CPAP 5 to 15 cm H2O with mask choice, patient prefers nasal pillow mask.  Advised patient aim to wear CPAP nightly for minimum 4 to 6 hours or longer.  Encouraged weight loss efforts as able.  Advised against driving if experiencing excessive daytime sleepiness or fatigue.   05/27/2023 Discussed the use of AI scribe software for clinical note transcription with the patient, who gave verbal consent to proceed.  History of Present Illness   Megan Riddle is a 48 year old female with moderate obstructive sleep apnea who presents for follow-up regarding CPAP therapy  management.  Diagnosed with sleep apnea in 2013, she underwent a repeat sleep study in 2018 showing moderate to severe sleep apnea with 25 apneic events per hour. She under went a subsequent study in May 2024 confirmed moderate sleep apnea with an AHI of 24, leading to the resumption of CPAP therapy in December 2024 after a break due to losing her machine and some weight loss.  She reports issues with her new CPAP machine, noting  incorrect pressure settings. She experiences a persistent cough and dryness, attributing these symptoms to the machine's settings. Attempts to adjust the settings to auto resulted in air pressure that was too high. Current auto setting auto with min pressure 4cm h20 and max pressure 4cm h20.   She also reports a cracked water chamber in the CPAP machine, causing leakage and delaying her use of the machine for over three weeks while waiting for a replacement part due to delivery issues. She expresses concern about using the machine due to leakage and electrical safety.  She uses nasal pillows with her CPAP and reports nasal congestion, postnasal drip, and red, puffy eyes in the morning. She currently takes Claritin for allergies but has previously used Zyrtec. She is not using any nasal spray but reports a runny nose and postnasal drip.  The cough and throat discomfort began after using the machine with the cracked water chamber, suggesting improper humidification.      Airview download 03/12/2023 - 05/24/2023 Usage 29/74 days (34%) Average usage days used 2 hours 25 minutes Pressure 4-4 cm H2O Air leak 17.3 L/min AHI 1.5  Obstructive Sleep Apnea Moderate obstructive sleep apnea with inadequate CPAP settings causing dryness, cough, and discomfort. Cracked water chamber affecting humidification, replaced recently by DME company. Current settings min pressure 4cm h20- max pressure 4cm h20. - Adjust CPAP pressure settings to minimum 4 and maximum 12 for auto-adjustment. - Encourage patient work on compliance, aim to wear CPAP nightly 4-6 hours  - Follow up in 2-4 weeks via message to assess CPAP tolerance and effectiveness. - If issues persist, recommend formal CPAP titration study in lab. - Ensure she has new mask and sufficient filters for CPAP use.   Allergic Rhinitis Nasal congestion, rhinorrhea, postnasal drip, and conjunctival injection possibly exacerbated by CPAP use and/or seasonal  allergies. Claritin may be insufficient. - Switch from Claritin to Zyrtec for enhanced antihistamine effect. - Use over-the-counter Flonase (fluticasone) nasal spray for additional symptom relief. - If symptoms persist, consider prescribing Astelin nasal spray.   09/05/2023- Interim hx  Discussed the use of AI scribe software for clinical note transcription with the patient, who gave verbal consent to proceed. History of Present Illness Megan Riddle is a 48 year old female with moderate sleep apnea who presents for a follow-up regarding CPAP compliance.  She has been unable to use her CPAP machine consistently due to homelessness and lack of access to electricity. Recently, she moved into a hotel, providing her first stable residence in a while, and now has access to electricity. When she is able to use the CPAP machine, she finds it beneficial and experiences improved sleep quality. However, her usage has been sporadic over the past few months, with only a few nights of use in March, April, May, and very limited use in June, July, and August. She has a case Production designer, theatre/television/film for her mental health needs but reports that no significant assistance has been provided yet.  Airview download 03/10/23-09/05/23 Usage 40/180 days Average usage days used 2 hours  11 mins Pressure 4cm h20 Airleaks 15.8L/min AHI 2.1   Observations/Objective:  Appears well without respiratory symptoms   Assessment and Plan:  1. OSA (obstructive sleep apnea) (Primary)  Assessment and Plan Assessment & Plan Obstructive sleep apnea Moderate obstructive sleep apnea with compliance issues due to recent homelessness and lack of electricity. CPAP therapy is beneficial when used, improving sleep quality and controlling apneic events. Current living situation allows for CPAP use, but future housing stability is uncertain. Pressure settings are appropriate, and apnea score is well controlled when CPAP is used. CPAP use is important for  preventing cardiovascular problems. - Encourage consistent use of CPAP when possible. - Discuss with [facility] to prevent CPAP machine retrieval due to non-compliance. - Document current housing situation and compliance issues in medical record. - Explore options for CPAP battery or car adapters for use without electricity. - Schedule follow-up in six months. - Advise to communicate via MyChart for any issues or questions.  Follow Up Instructions:  6 months with Landry NP   I discussed the assessment and treatment plan with the patient. The patient was provided an opportunity to ask questions and all were answered. The patient agreed with the plan and demonstrated an understanding of the instructions.   The patient was advised to call back or seek an in-person evaluation if the symptoms worsen or if the condition fails to improve as anticipated.  I provided 22 minutes of non-face-to-face time during this encounter.   Almarie LELON Ferrari, NP

## 2023-09-05 NOTE — Patient Instructions (Signed)
  VISIT SUMMARY: You came in today for a follow-up regarding your sleep apnea and CPAP machine usage. We discussed the challenges you've faced with using your CPAP machine consistently due to your previous lack of stable housing and electricity. Now that you have moved into a hotel, you have better access to electricity and can use your CPAP machine more regularly.  YOUR PLAN: -OBSTRUCTIVE SLEEP APNEA: Obstructive sleep apnea is a condition where your airway becomes blocked during sleep, causing breathing pauses. Using your CPAP machine helps keep your airway open, improving your sleep quality and preventing potential cardiovascular problems. It's important to use your CPAP machine consistently. We discussed your current living situation and the importance of maintaining CPAP use. We will explore options for a CPAP battery or car adapter to help you use the machine even without electricity. Please communicate any issues or questions via MyChart.  INSTRUCTIONS: Please schedule a follow-up appointment in six months. If you have any issues or questions, reach out via MyChart.

## 2023-09-22 NOTE — Progress Notes (Deleted)
 Patient name: Megan Riddle MRN: [id number] DOB: [birth year] Sex: female  REASON FOR CONSULT: Evaluate intermittent left leg claudication  HPI: Megan Riddle is a 48 y.o. female, with history of diabetes that presents for evaluation of left leg claudication.  Well-known to vascular surgery and had a previous left iliofemoral thrombectomy with vein patch of the left common femoral artery using saphenous vein on 03/22/2018 by Dr. Eliza after she presented with acute limb ischemia.  Past Medical History:  Diagnosis Date   Arthritis    Cancer (HCC)    breast   Diabetes mellitus without complication (HCC)    DVT (deep venous thrombosis) (HCC)     Past Surgical History:  Procedure Laterality Date   FEMORAL-FEMORAL BYPASS GRAFT Left 03/22/2018   Procedure: LEFT ILIAC ARTERTY EMBOLECTOMY;  Surgeon: Eliza Lonni RAMAN, MD;  Location: [facility] OR;  Service: Vascular;  Laterality: Left;   INTRAOPERATIVE ARTERIOGRAM Left 03/22/2018   Procedure: Intra Operative Arteriogram LEFT LOWER LEG;  Surgeon: Eliza Lonni RAMAN, MD;  Location: [facility] OR;  Service: Vascular;  Laterality: Left;   PATCH ANGIOPLASTY Left 03/22/2018   Procedure: PATCH ANGIOPLASTY OF LEFT FEMORAL ARTERY;  Surgeon: Eliza Lonni RAMAN, MD;  Location: [facility] OR;  Service: Vascular;  Laterality: Left;    No family history on file.  SOCIAL HISTORY: Social History   Socioeconomic History   Marital status: Married    Spouse name: Not on file   Number of children: Not on file   Years of education: Not on file   Highest education level: Not on file  Occupational History   Not on file  Tobacco Use   Smoking status: Every Day    Current packs/day: 0.50    Average packs/day: 0.5 packs/day for 27.0 years (13.5 ttl pk-yrs)    Types: Cigarettes   Smokeless tobacco: Never   Tobacco comments:    Down to 7 cigarettes per day.  Trying to quit.  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, CMA  Vaping Use   Vaping status: Never Used  Substance and Sexual  Activity   Alcohol use: Yes    Comment: During the holidays    Drug use: Yes    Types: Marijuana    Comment: Everyother day    Sexual activity: Not on file  Other Topics Concern   Not on file  Social History Narrative   Not on file   Social Drivers of Health   Financial Resource Strain: High Risk (05/12/2023)   Received from [facility]   Overall Financial Resource Strain (CARDIA)    Difficulty of Paying Living Expenses: Very hard  Food Insecurity: Food Insecurity Present (05/12/2023)   Received from [facility]   Hunger Vital Sign    Within the past 12 months, you worried that your food would run out before you got the money to buy more.: Often true    Within the past 12 months, the food you bought just didn't last and you didn't have money to get more.: Sometimes true  Transportation Needs: No Transportation Needs (05/12/2023)   Received from [facility] - Transportation    Lack of Transportation (Medical): No    Lack of Transportation (Non-Medical): No  Physical Activity: Inactive (05/12/2023)   Received from [facility]   Exercise Vital Sign    On average, how many days per week do you engage in moderate to strenuous exercise (like a brisk walk)?: 2 days    On average, how many minutes do you engage in exercise at  this level?: 0 min  Stress: Stress Concern Present (05/12/2023)   Received from [facility] of Occupational Health - Occupational Stress Questionnaire    Feeling of Stress : Very much  Social Connections: Somewhat Isolated (05/12/2023)   Received from [facility]   Social Network    How would you rate your social network (family, work, friends)?: Restricted participation with some degree of social isolation  Intimate Partner Violence: Not At Risk (05/12/2023)   Received from [facility]   HITS    Over the last 12 months how often did your partner physically hurt you?: Never    Over the last 12 months how often did your  partner insult you or talk down to you?: Never    Over the last 12 months how often did your partner threaten you with physical harm?: Never    Over the last 12 months how often did your partner scream or curse at you?: Never    No Known Allergies  Current Outpatient Medications  Medication Sig Dispense Refill   Cyanocobalamin (VITAMIN B 12 PO) Take 1 tablet by mouth daily.     glipiZIDE  (GLUCOTROL ) 5 MG tablet Take 5 mg by mouth 2 (two) times daily.     meloxicam  (MOBIC ) 15 MG tablet TAKE 1 TABLET (15 MG TOTAL) BY MOUTH DAILY. 30 tablet 3   pantoprazole  (PROTONIX ) 40 MG tablet Take 1 tablet (40 mg total) by mouth daily. (Patient not taking: Reported on 09/05/2023) 30 tablet 0   TURMERIC PO Take 1 tablet by mouth daily.     No current facility-administered medications for this visit.    REVIEW OF SYSTEMS:  [X]  denotes positive finding, [ ]  denotes negative finding Cardiac  Comments:  Chest pain or chest pressure: ***   Shortness of breath upon exertion:    Short of breath when lying flat:    Irregular heart rhythm:        Vascular    Pain in calf, thigh, or hip brought on by ambulation:    Pain in feet at night that wakes you up from your sleep:     Blood clot in your veins:    Leg swelling:         Pulmonary    Oxygen at home:    Productive cough:     Wheezing:         Neurologic    Sudden weakness in arms or legs:     Sudden numbness in arms or legs:     Sudden onset of difficulty speaking or slurred speech:    Temporary loss of vision in one eye:     Problems with dizziness:         Gastrointestinal    Blood in stool:     Vomited blood:         Genitourinary    Burning when urinating:     Blood in urine:        Psychiatric    Major depression:         Hematologic    Bleeding problems:    Problems with blood clotting too easily:        Skin    Rashes or ulcers:        Constitutional    Fever or chills:      PHYSICAL EXAM: There were no vitals filed  for this visit.  GENERAL: The patient is a well-nourished female, in no acute distress. The vital signs are documented  above. CARDIAC: There is a regular rate and rhythm.  VASCULAR: *** PULMONARY: There is good air exchange bilaterally without wheezing or rales. ABDOMEN: Soft and non-tender with normal pitched bowel sounds.  MUSCULOSKELETAL: There are no major deformities or cyanosis. NEUROLOGIC: No focal weakness or paresthesias are detected. SKIN: There are no ulcers or rashes noted. PSYCHIATRIC: The patient has a normal affect.  DATA:   ABI's 08/25/23 - 1.14 left triphasic  Assessment/Plan:   48 y.o. female, with history of diabetes that presents for evaluation of left leg claudication.  Well-known to vascular surgery and had a previous left iliofemoral thrombectomy with vein patch of the left common femoral artery using saphenous vein on 03/22/2018 by Dr. Eliza for acute left limb ischemia.   Lonni DOROTHA Gaskins, MD [facility] [place] Office: [phone]

## 2023-09-23 ENCOUNTER — Ambulatory Visit: Payer: MEDICAID | Attending: Vascular Surgery | Admitting: Vascular Surgery

## 2023-12-03 ENCOUNTER — Telehealth: Payer: Self-pay

## 2023-12-03 NOTE — Telephone Encounter (Signed)
 CMN received from [employer] regarding pt's neb supplies. This only required a signature from the provider. This has been signed and faxed. NFN

## 2023-12-18 ENCOUNTER — Ambulatory Visit: Payer: MEDICAID | Attending: Nurse Practitioner | Admitting: Occupational Therapy

## 2023-12-25 ENCOUNTER — Emergency Department (HOSPITAL_COMMUNITY): Payer: MEDICAID

## 2023-12-25 ENCOUNTER — Emergency Department (HOSPITAL_COMMUNITY)
Admission: EM | Admit: 2023-12-25 | Discharge: 2023-12-25 | Disposition: A | Discharge status: Home / Self Care | Payer: MEDICAID

## 2023-12-25 ENCOUNTER — Other Ambulatory Visit: Payer: Self-pay

## 2023-12-25 DIAGNOSIS — R110 Nausea: Secondary | ICD-10-CM | POA: Insufficient documentation

## 2023-12-25 DIAGNOSIS — R202 Paresthesia of skin: Secondary | ICD-10-CM | POA: Diagnosis not present

## 2023-12-25 DIAGNOSIS — Z720 Tobacco use: Secondary | ICD-10-CM | POA: Diagnosis not present

## 2023-12-25 DIAGNOSIS — R000 Tachycardia, unspecified: Secondary | ICD-10-CM | POA: Diagnosis not present

## 2023-12-25 DIAGNOSIS — R0602 Shortness of breath: Secondary | ICD-10-CM | POA: Diagnosis not present

## 2023-12-25 DIAGNOSIS — R079 Chest pain, unspecified: Secondary | ICD-10-CM

## 2023-12-25 DIAGNOSIS — R072 Precordial pain: Secondary | ICD-10-CM | POA: Insufficient documentation

## 2023-12-25 DIAGNOSIS — E119 Type 2 diabetes mellitus without complications: Secondary | ICD-10-CM | POA: Insufficient documentation

## 2023-12-25 DIAGNOSIS — Z7984 Long term (current) use of oral hypoglycemic drugs: Secondary | ICD-10-CM | POA: Diagnosis not present

## 2023-12-25 LAB — CBC WITH DIFFERENTIAL/PLATELET
Abs Immature Granulocytes: 0.05 K/uL (ref 0.00–0.07)
Basophils Absolute: 0.1 K/uL (ref 0.0–0.1)
Basophils Relative: 1 %
Eosinophils Absolute: 0 K/uL (ref 0.0–0.5)
Eosinophils Relative: 0 %
HCT: 40.9 % (ref 36.0–46.0)
Hemoglobin: 14.2 g/dL (ref 12.0–15.0)
Immature Granulocytes: 1 %
Lymphocytes Relative: 33 %
Lymphs Abs: 3.5 K/uL (ref 0.7–4.0)
MCH: 33.7 pg (ref 26.0–34.0)
MCHC: 34.7 g/dL (ref 30.0–36.0)
MCV: 97.1 fL (ref 80.0–100.0)
Monocytes Absolute: 0.7 K/uL (ref 0.1–1.0)
Monocytes Relative: 6 %
Neutro Abs: 6.3 K/uL (ref 1.7–7.7)
Neutrophils Relative %: 59 %
Platelets: 302 K/uL (ref 150–400)
RBC: 4.21 MIL/uL (ref 3.87–5.11)
RDW: 11.8 % (ref 11.5–15.5)
WBC: 10.7 K/uL — ABNORMAL HIGH (ref 4.0–10.5)
nRBC: 0 % (ref 0.0–0.2)

## 2023-12-25 LAB — COMPREHENSIVE METABOLIC PANEL WITH GFR
ALT: 27 U/L (ref 0–44)
AST: 27 U/L (ref 15–41)
Albumin: 3.8 g/dL (ref 3.5–5.0)
Alkaline Phosphatase: 40 U/L (ref 38–126)
Anion gap: 14 (ref 5–15)
BUN: 9 mg/dL (ref 6–20)
CO2: 22 mmol/L (ref 22–32)
Calcium: 9.8 mg/dL (ref 8.9–10.3)
Chloride: 100 mmol/L (ref 98–111)
Creatinine, Ser: 0.68 mg/dL (ref 0.44–1.00)
GFR, Estimated: >60 mL/min (ref >60)
Glucose, Bld: 259 mg/dL — ABNORMAL HIGH (ref 70–99)
Potassium: 3.7 mmol/L (ref 3.5–5.1)
Sodium: 136 mmol/L (ref 135–145)
Total Bilirubin: 1.5 mg/dL — ABNORMAL HIGH (ref 0.0–1.2)
Total Protein: 7 g/dL (ref 6.5–8.1)

## 2023-12-25 LAB — HCG, SERUM, QUALITATIVE: Preg, Serum: NEGATIVE

## 2023-12-25 LAB — BRAIN NATRIURETIC PEPTIDE: B Natriuretic Peptide: 12.9 pg/mL (ref 0.0–100.0)

## 2023-12-25 LAB — MAGNESIUM: Magnesium: 1.4 mg/dL — ABNORMAL LOW (ref 1.7–2.4)

## 2023-12-25 LAB — TROPONIN I (HIGH SENSITIVITY)
Troponin I (High Sensitivity): 3 ng/L (ref <18)
Troponin I (High Sensitivity): 4 ng/L (ref <18)

## 2023-12-25 MED ORDER — FENTANYL CITRATE (PF) 50 MCG/ML IJ SOSY
50.0000 ug | PREFILLED_SYRINGE | Freq: Once | INTRAMUSCULAR | Status: AC
  Administered 2023-12-25: 50 ug via INTRAVENOUS
  Filled 2023-12-25: qty 1

## 2023-12-25 MED ORDER — MAGNESIUM SULFATE 2 GM/50ML IV SOLN
2.0000 g | Freq: Once | INTRAVENOUS | Status: AC
  Administered 2023-12-25: 2 g via INTRAVENOUS
  Filled 2023-12-25: qty 50

## 2023-12-25 MED ORDER — MAGNESIUM OXIDE 400 MG PO TABS: 400.0000 mg | ORAL_TABLET | Freq: Every day | ORAL | 1 refills | Status: AC

## 2023-12-25 MED ORDER — IOHEXOL 350 MG/ML SOLN
75.0000 mL | Freq: Once | INTRAVENOUS | Status: AC | PRN
  Administered 2023-12-25: 75 mL via INTRAVENOUS

## 2023-12-25 MED ORDER — NITROGLYCERIN 0.4 MG SL SUBL: 0.4000 mg | SUBLINGUAL_TABLET | SUBLINGUAL | Status: DC | PRN

## 2023-12-25 NOTE — ED Provider Notes (Signed)
 [place] EMERGENCY DEPARTMENT AT [facility] Provider Note   CSN: [id number] Arrival date & time: 12/25/23  1544     Patient presents with: No chief complaint on file.   Megan Riddle is a 48 y.o. female. Hx of left iliac artery occlusion, type 2 diabetes, palpitations, tobacco use presenting with chest pain, SOB.  History per patient, EMS.  Reports that over the past 3 weeks, patient has been having numbness and tingling to her right leg, feels very similar to prior blood clots that she has had in the left leg.  She states that yesterday into today, she developed sudden onset chest pain and shortness of breath, with radiating symptoms to the left side.  She states that the shortness of breath has been significantly worsening, therefore called EMS.  95% on room air, however patient very tachypneic, therefore placed on 4 L O2  with improvement to 100% and comfort purposes.  Endorses nausea, no episodes of vomiting.  Denies fever or chills.  Denies abdominal pain.  Patient states that she has not been on anticoagulation for at least 5 years.  She also endorses that she has been using tobacco, but smoked marijuana yesterday for the first time in a long period of time, and states that she feels more anxious after doing this.  325 mg ASA prior to EMS arrival, 4 morphine , and nitro given with EMS.  Endorses chest pain 6/10.   HPI     Prior to Admission medications   Medication Sig Start Date End Date Taking? Authorizing Provider  Cyanocobalamin (VITAMIN B 12 PO) Take 1 tablet by mouth daily.    [provider]  glipiZIDE  (GLUCOTROL ) 5 MG tablet Take 5 mg by mouth 2 (two) times daily.    [provider]  meloxicam  (MOBIC ) 15 MG tablet TAKE 1 TABLET (15 MG TOTAL) BY MOUTH DAILY. 02/25/23   Hyatt, Max T, DPM  pantoprazole  (PROTONIX ) 40 MG tablet Take 1 tablet (40 mg total) by mouth daily. Patient not taking: Reported on 09/05/2023 07/27/22   Tobie Gaines, DO  TURMERIC  PO Take 1 tablet by mouth daily.    [provider]    Allergies: Patient has no known allergies.    Review of Systems  Updated Vital Signs LMP 12/20/2017 (Approximate) Comment:  I don't even remember.   Physical Exam Vitals and nursing note reviewed.  Constitutional:      General: She is in acute distress.     Appearance: She is well-developed. She is not ill-appearing.  HENT:     Head: Normocephalic and atraumatic.     Mouth/Throat:     Mouth: Mucous membranes are moist.  Eyes:     Conjunctiva/sclera: Conjunctivae normal.  Cardiovascular:     Rate and Rhythm: Regular rhythm. Tachycardia present.     Pulses: Normal pulses.     Heart sounds: Normal heart sounds. No murmur heard.    No gallop.  Pulmonary:     Effort: Pulmonary effort is normal. No respiratory distress.     Breath sounds: Normal breath sounds. No stridor. No wheezing, rhonchi or rales.     Comments: Tachypneic to high 20s Abdominal:     General: Abdomen is flat. There is no distension.     Palpations: Abdomen is soft.     Tenderness: There is no abdominal tenderness. There is no right CVA tenderness, left CVA tenderness or guarding.  Musculoskeletal:        General: No swelling.  Cervical back: Neck supple. No rigidity.     Right lower leg: No edema.     Left lower leg: No edema.  Skin:    General: Skin is warm and dry.     Capillary Refill: Capillary refill takes less than 2 seconds.  Neurological:     General: No focal deficit present.     Mental Status: She is alert and oriented to person, place, and time.  Psychiatric:        Mood and Affect: Mood normal.     (all labs ordered are listed, but only abnormal results are displayed) Labs Reviewed - No data to display  EKG: None  Radiology: No results found.   Procedures   Medications Ordered in the ED - No data to display                                  Medical Decision Making Amount and/or Complexity of Data  Reviewed Labs: ordered. Radiology: ordered.  Risk OTC drugs. Prescription drug management.   Based on patient presentation, history, evaluation, high suspicion for hypomagnesemia, as well as anxiety in association with smoking marijuana, which patient does not normally do, and earlier today reported that she smoked marijuana.  While on presentation to the ED, patient endorses that her chest pain and shortness of breath have completely resolved, and tachypnea has completely resolved.  Workup overall has remained reassuring, I have low suspicion for ACS versus pneumonia versus pneumonitis versus pneumothorax versus PE versus CHF exacerbation versus AKI versus severe electrolyte abnormalities versus pregnancy versus ectopic pregnancy.  Patient of note on the imaging does have evidence of pulmonary nodules in the left upper lobe, this was discussed with the patient who is already made aware of this from previous imaging, she is recommended to follow-up with her PCP to receive repeat imaging, as well as to quit smoking.  Resources were provided to the patient.  Magnesium  repleted in the ED.  Overall, with reassuring workup, and patient's symptoms overall significantly improved, patient is stable for discharge at this time.  Discussed strict return precautions to ED, recommended follow-up with PCP as previously discussed, as well as in the next 2 to 3 days for repeat evaluation.     Final diagnoses:  None    ED Discharge Orders     None          Arlee Katz, MD 12/26/23 0101    Simon Lavonia SAILOR, MD 12/26/23 2231

## 2023-12-25 NOTE — ED Triage Notes (Signed)
 According to [facility]: Pt is complaining of sub sternal chest pain radiating left radiating to the left arm with tingling. This has gone on for about 2 hours now. She has been comparing of tingling and numbness down to right foot for the last 2 weeks. Pt has a history of DVT and a stent place previously.   Pt has 20 gauge in left ac. Pt has 325 mg of Asprin 0.4 nitroglycerin  sub lingual and 4 mg of morphine  before arrival to ed.   Last bp 132/80, hr 130, spo2 is 95% room air,pt is on 4l nasal cannula for comfort 100%. RR 30 cbg 219

## 2023-12-29 ENCOUNTER — Ambulatory Visit (HOSPITAL_COMMUNITY): Payer: MEDICAID

## 2023-12-30 ENCOUNTER — Ambulatory Visit (HOSPITAL_COMMUNITY): Admission: RE | Admit: 2023-12-30 | Discharge: 2023-12-30 | Disposition: A | Payer: MEDICAID | Source: Ambulatory Visit

## 2023-12-30 DIAGNOSIS — R52 Pain, unspecified: Secondary | ICD-10-CM | POA: Diagnosis not present

## 2023-12-30 DIAGNOSIS — Z86718 Personal history of other venous thrombosis and embolism: Secondary | ICD-10-CM | POA: Insufficient documentation

## 2024-01-06 NOTE — Therapy (Incomplete)
 OUTPATIENT OCCUPATIONAL THERAPY ORTHO EVALUATION  Patient Name: Megan Riddle MRN: [id number] DOB:1975/02/12, 48 y.o., female Today's Date: 01/06/2024  PCP: Celestia Harder, NP REFERRING PROVIDER: Celestia Harder, NP  END OF SESSION:   Past Medical History:  Diagnosis Date   Arthritis    Cancer [facility])    breast   Diabetes mellitus without complication (HCC)    DVT (deep venous thrombosis) (HCC)    Past Surgical History:  Procedure Laterality Date   FEMORAL-FEMORAL BYPASS GRAFT Left 03/22/2018   Procedure: LEFT ILIAC ARTERTY EMBOLECTOMY;  Surgeon: Eliza Lonni RAMAN, MD;  Location: [facility] OR;  Service: Vascular;  Laterality: Left;   INTRAOPERATIVE ARTERIOGRAM Left 03/22/2018   Procedure: Intra Operative Arteriogram LEFT LOWER LEG;  Surgeon: Eliza Lonni RAMAN, MD;  Location: [facility] OR;  Service: Vascular;  Laterality: Left;   PATCH ANGIOPLASTY Left 03/22/2018   Procedure: PATCH ANGIOPLASTY OF LEFT FEMORAL ARTERY;  Surgeon: Eliza Lonni RAMAN, MD;  Location: [facility] OR;  Service: Vascular;  Laterality: Left;   Patient Active Problem List   Diagnosis Date Noted   Nausea and vomiting 07/26/2022   Abdominal pain 07/25/2022   Lumbar radiculopathy 04/22/2022   Hidradenitis suppurativa 08/14/2021   Palpitations 11/24/2018   Acute serous otitis media of left ear 09/11/2018   Occlusion of left iliac artery (HCC) 03/23/2018   Sensation of cold in leg 03/22/2018   Class 2 severe obesity due to excess calories with serious comorbidity and body mass index (BMI) of 39.0 to 39.9 in adult 10/27/2017   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 edema, without long-term current use of insulin  (HCC) 10/27/2017   Hyperlipidemia associated with type 2 diabetes mellitus (HCC) 08/22/2016   Tobacco user 02/20/2016   Panic attack as reaction to stress 02/13/2016   OSA (obstructive sleep apnea) 01/14/2014   Nicotine  dependence 01/14/2014    PTSD (post-traumatic stress disorder) 01/14/2014    ONSET DATE: 12/02/2023 referral date  REFERRING DIAG: M79.641 (ICD-10-CM) - Pain in right hand M79.642 (ICD-10-CM) - Pain in left hand  THERAPY DIAG:  No diagnosis found.  Rationale for Evaluation and Treatment: Rehabilitation  SUBJECTIVE:   SUBJECTIVE STATEMENT: *** Pt accompanied by: {accompnied:27141}  PERTINENT HISTORY: ***  PRECAUTIONS: {Therapy precautions:24002}  RED FLAGS: {PT Red Flags:29287}   WEIGHT BEARING RESTRICTIONS: {Yes ***/No:24003}  PAIN:  Are you having pain? {OPRCPAIN:27236}  FALLS: Has patient fallen in last 6 months? {fallsyesno:27318}  LIVING ENVIRONMENT: Lives with: {OPRC lives with:25569::lives with their family} Lives in: {Lives in:25570} Stairs: {opstairs:27293} Has following equipment at home: {Assistive devices:23999}  PLOF: {PLOF:24004}  PATIENT GOALS: ***  NEXT MD VISIT: ***  OBJECTIVE:  Note: Objective measures were completed at Evaluation unless otherwise noted.  HAND DOMINANCE: {MISC; OT HAND DOMINANCE:[phone]}  ADLs: {ADLs OT:31716}  FUNCTIONAL OUTCOME MEASURES: {OTFUNCTIONALMEASURES:27238}  UPPER EXTREMITY ROM:     {AROM/PROM:27142} ROM Right eval Left eval  Shoulder flexion    Shoulder abduction    Shoulder adduction    Shoulder extension    Shoulder internal rotation    Shoulder external rotation    Elbow flexion    Elbow extension    Wrist flexion    Wrist extension    Wrist ulnar deviation    Wrist radial deviation    Wrist pronation    Wrist supination    (Blank rows = not tested)  {AROM/PROM:27142} ROM Right eval Left eval  Thumb MCP (0-60)    Thumb IP (0-80)    Thumb Radial abd/add (0-55)  Thumb Palmar abd/add (0-45)     Thumb Opposition to Small Finger     Index MCP (0-90)     Index PIP (0-100)     Index DIP (0-70)      Long MCP (0-90)      Long PIP (0-100)      Long DIP (0-70)      Ring MCP (0-90)      Ring PIP (0-100)       Ring DIP (0-70)      Little MCP (0-90)      Little PIP (0-100)      Little DIP (0-70)      (Blank rows = not tested)   UPPER EXTREMITY MMT:     MMT Right eval Left eval  Shoulder flexion    Shoulder abduction    Shoulder adduction    Shoulder extension    Shoulder internal rotation    Shoulder external rotation    Middle trapezius    Lower trapezius    Elbow flexion    Elbow extension    Wrist flexion    Wrist extension    Wrist ulnar deviation    Wrist radial deviation    Wrist pronation    Wrist supination    (Blank rows = not tested)  HAND FUNCTION: {handfunction:27230}  COORDINATION: {otcoordination:27237}  SENSATION: {sensation:27233}  EDEMA: ***  COGNITION: Overall cognitive status: {cognition:24006} Areas of impairment: {impairedcognition:27234}  OBSERVATIONS: ***   TREATMENT DATE: ***                                                                                                                            Modalities: {OPRCMODALITIES:31717}     PATIENT EDUCATION: Education details: *** Person educated: {Person educated:25204} Education method: {Education Method:25205} Education comprehension: {Education Comprehension:25206}  HOME EXERCISE PROGRAM: ***  GOALS: Goals reviewed with patient? {yes/no:20286}  SHORT TERM GOALS: Target date: ***  *** Baseline: Goal status: INITIAL  2.  *** Baseline:  Goal status: INITIAL  3.  *** Baseline:  Goal status: INITIAL  4.  *** Baseline:  Goal status: INITIAL  5.  *** Baseline:  Goal status: INITIAL  6.  *** Baseline:  Goal status: INITIAL  LONG TERM GOALS: Target date: ***  *** Baseline:  Goal status: INITIAL  2.  *** Baseline:  Goal status: INITIAL  3.  *** Baseline:  Goal status: INITIAL  4.  *** Baseline:  Goal status: INITIAL  5.  *** Baseline:  Goal status: INITIAL  6.  *** Baseline:  Goal status: INITIAL  ASSESSMENT:  CLINICAL  IMPRESSION: Patient is a *** y.o. *** who was seen today for occupational therapy evaluation for ***.   PERFORMANCE DEFICITS: in functional skills including {OT physical skills:25468}, cognitive skills including {OT cognitive skills:25469}, and psychosocial skills including {OT psychosocial skills:25470}.   IMPAIRMENTS: are limiting patient from {OT performance deficits:25471}.   COMORBIDITIES: {Comorbidities:25485} that affects occupational performance. Patient will benefit from skilled OT to address above  impairments and improve overall function.  MODIFICATION OR ASSISTANCE TO COMPLETE EVALUATION: {OT modification:25474}  OT OCCUPATIONAL PROFILE AND HISTORY: {OT PROFILE AND HISTORY:25484}  CLINICAL DECISION MAKING: {OT CDM:25475}  REHAB POTENTIAL: {rehabpotential:25112}  EVALUATION COMPLEXITY: {Evaluation complexity:25115}      PLAN:  OT FREQUENCY: {rehab frequency:25116}  OT DURATION: {rehab duration:25117}  PLANNED INTERVENTIONS: {OT Interventions:25467}  RECOMMENDED OTHER SERVICES: ***  CONSULTED AND AGREED WITH PLAN OF CARE: {ENR:74513}  PLAN FOR NEXT SESSION: ***   Rocky Dutch, OT 01/06/2024, 8:34 AM

## 2024-01-07 ENCOUNTER — Ambulatory Visit: Payer: MEDICAID

## 2024-01-07 ENCOUNTER — Ambulatory Visit: Payer: MEDICAID | Admitting: Occupational Therapy

## 2024-01-07 ENCOUNTER — Other Ambulatory Visit: Payer: Self-pay

## 2024-01-07 DIAGNOSIS — M79642 Pain in left hand: Secondary | ICD-10-CM | POA: Insufficient documentation

## 2024-01-07 DIAGNOSIS — R29818 Other symptoms and signs involving the nervous system: Secondary | ICD-10-CM | POA: Insufficient documentation

## 2024-01-07 DIAGNOSIS — M79641 Pain in right hand: Secondary | ICD-10-CM | POA: Insufficient documentation

## 2024-01-07 DIAGNOSIS — R278 Other lack of coordination: Secondary | ICD-10-CM

## 2024-01-07 DIAGNOSIS — M6281 Muscle weakness (generalized): Secondary | ICD-10-CM

## 2024-01-07 DIAGNOSIS — R208 Other disturbances of skin sensation: Secondary | ICD-10-CM | POA: Diagnosis present

## 2024-01-07 DIAGNOSIS — R29898 Other symptoms and signs involving the musculoskeletal system: Secondary | ICD-10-CM | POA: Insufficient documentation

## 2024-01-07 NOTE — Therapy (Signed)
 OUTPATIENT OCCUPATIONAL THERAPY ORTHO EVALUATION & TREATMENT  Patient Name: Megan Riddle MRN: [id number] DOB:05-01-1975, 48 y.o., female Today's Date: 01/07/2024  PCP: Celestia Harder, NP REFERRING PROVIDER: Celestia Harder, NP  END OF SESSION:  OT End of Session - 01/07/24 1113     Visit Number 1    Number of Visits 10    Date for Recertification  02/27/23    Authorization Type [place] [employer] 2025 Copay: $4 VL: 30 comb    Authorization Time Period PT/OT 30  Auth: REQUIRED    OT Start Time 1115    OT Stop Time 1215    OT Time Calculation (min) 60 min    Equipment Utilized During Treatment Testing material, pink putty    Activity Tolerance Patient tolerated treatment well    Behavior During Therapy WFL for tasks assessed/performed          Past Medical History:  Diagnosis Date   Arthritis    Cancer (HCC)    breast   Diabetes mellitus without complication (HCC)    DVT (deep venous thrombosis) (HCC)    Past Surgical History:  Procedure Laterality Date   FEMORAL-FEMORAL BYPASS GRAFT Left 03/22/2018   Procedure: LEFT ILIAC ARTERTY EMBOLECTOMY;  Surgeon: Eliza Lonni RAMAN, MD;  Location: [facility] OR;  Service: Vascular;  Laterality: Left;   INTRAOPERATIVE ARTERIOGRAM Left 03/22/2018   Procedure: Intra Operative Arteriogram LEFT LOWER LEG;  Surgeon: Eliza Lonni RAMAN, MD;  Location: [facility] OR;  Service: Vascular;  Laterality: Left;   PATCH ANGIOPLASTY Left 03/22/2018   Procedure: PATCH ANGIOPLASTY OF LEFT FEMORAL ARTERY;  Surgeon: Eliza Lonni RAMAN, MD;  Location: [facility] OR;  Service: Vascular;  Laterality: Left;   Patient Active Problem List   Diagnosis Date Noted   Nausea and vomiting 07/26/2022   Abdominal pain 07/25/2022   Lumbar radiculopathy 04/22/2022   Hidradenitis suppurativa 08/14/2021   Palpitations 11/24/2018   Acute serous otitis media of left ear 09/11/2018   Occlusion of left iliac artery (HCC) 03/23/2018   Sensation of cold in leg 03/22/2018    Class 2 severe obesity due to excess calories with serious comorbidity and body mass index (BMI) of 39.0 to 39.9 in adult 10/27/2017   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 edema, without long-term current use of insulin  (HCC) 10/27/2017   Hyperlipidemia associated with type 2 diabetes mellitus (HCC) 08/22/2016   Tobacco user 02/20/2016   Panic attack as reaction to stress 02/13/2016   OSA (obstructive sleep apnea) 01/14/2014   Nicotine  dependence 01/14/2014   PTSD (post-traumatic stress disorder) 01/14/2014    ONSET DATE: 12/02/2023  REFERRING DIAG:  F20.358 (ICD-10-CM) - Pain in right hand  M79.642 (ICD-10-CM) - Pain in left hand  THERAPY DIAG:  Pain in left hand  Pain in right hand  Muscle weakness (generalized)  Other lack of coordination  Other disturbances of skin sensation  Other symptoms and signs involving the musculoskeletal system  Other symptoms and signs involving the nervous system  Rationale for Evaluation and Treatment: Rehabilitation  SUBJECTIVE:   SUBJECTIVE STATEMENT:   Pt reports that she has problems with both hands R > L although it seems to be worse in her R index finger and L pinky side of her hand.  She reports she has numbness in her hands and they get cold.  She also lists tingling, burning, and stinging sensations.  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  She is currently not working.  Pt also confirms that she will wake up due to issues with her hands.  Pt reports she is down to just 2 cigarettes/day - still trying to quit  Pt accompanied by: self  PERTINENT HISTORY: PMHx: DM2 (pt reports 'neuropathy'), lumbar radiculopathy, PTSD, nicotine  dependence   PRECAUTIONS: None  RED FLAGS: None   WEIGHT BEARING RESTRICTIONS: No  PAIN:  Are you having pain? Yes: NPRS scale: 8 Pain location: palms of hands and  fingers, R pointer and along ulnar boarder of palm  Pain description: tingling, burning, stinging Aggravating factors: cleaning, washing dishes etc Relieving factors: has a massager gun, stopping activity, has a heating pad  FALLS: Has patient fallen in last 6 months? No  LIVING ENVIRONMENT: Lives with: lives with their family and lives with their son Lives in: Other Hotel Stairs: No Has following equipment at home: None  PLOF: Independent, working prior to this  PATIENT GOALS: Decreased pain in her hands  NEXT MD VISIT: MD note 11/27/23 stated: Follow up in about 3 weeks (around 12/18/2023) for lab trigger point. - Pt may need to schedule follow up.  OBJECTIVE:  Note: Objective measures were completed at Evaluation unless otherwise noted.  HAND DOMINANCE: Left  ADLs: WFL  FUNCTIONAL OUTCOME MEASURES: Quick Dash: 63.6   UPPER EXTREMITY ROM:     BUE - generally WFL with full composite fist flexion  UPPER EXTREMITY MMT:     MMT Right eval Left eval  Shoulder flexion 3+ 3  Shoulder abduction    Shoulder adduction    Shoulder extension    Shoulder internal rotation    Shoulder external rotation    Middle trapezius    Lower trapezius    Elbow flexion 4- 3+  Elbow extension 3+ 3  Wrist flexion    Wrist extension    Wrist ulnar deviation    Wrist radial deviation    Wrist pronation    Wrist supination    (Blank rows = not tested)  HAND FUNCTION: Grip strength: Right: 17.4, 38.1, 19.4 lbs; Left: 41.4, 20.9, 20.5 lbs Average: Right 25.0 lbs; Left 27.6 lbs  COORDINATION: 9 Hole Peg test: Right: 21.28 sec; Left: 26.86  sec  SENSATION: Dorsally, hands feels normal R hand - palm more sore and aching L hand - hurts more along edge of pinkie Tingling and numbness in toes and fingers.  Pt grossly identifies 100% of finger touches to individual fingers on both hands ie) OTRs finger.   EDEMA: Slight in hands  COGNITION: Overall cognitive status: Within  functional limits for tasks assessed  OBSERVATIONS: Pt ambulates with no AE and no loss of balance. The pt appears well kept.  She has inconsistent grip strengths with varied descriptors of sensory changes in hands that are not all consistent with carpal tunnel ie) includes ulnar border of hands.  Pt does have a tightness in the back of her L shoulder blade that may be a trigger point (as noted by MD also).   TREATMENT DATE: 01/07/24                                                                                                                            -  Self Care education and training completed for duration as noted below including:  OT educated pt on rehabilitation process and results of objective measures in relation to pt specific goals.  Educated on benefit to smoking cessation as related to neuropathy also.    Introduced sleep positioning AND joint protection principles via online images and verbal education to reduce stress to upper extremity nerves, which could be attributing to reported paresthesias and pain in affected extremities. Patient verbalized understanding. Handouts not provided at eval.  Educated pt in splint options for day/nighttime use with low-cost nighttime option demonstrated using a stockinette hand sleeve with thumb hole and folded washcloth inside to minimize wrist flexion.  - Therapeutic exercises completed for duration as noted below including:  Pt issued tendon gliding exercises/handout with review of motions to isolate DIP, PIP and MCP joints for straight finger position, hook (DIP/PIP flexion), fist (DIP/PIP/MCP flexion), taco/duck (MCP flexion only) and flat fist (MCP and PIP flexion). Education provided on purpose of tendon glide exercises ie) to increase the circulation to the hand and wrist, reduce swelling and promote healthier soft tissue for increased AROM and not to build hand or wrist strength at this time.  PATIENT EDUCATION: Education details: OT role  and POC Considerations, sleep positions & tendon gliding exercises Person educated: Patient Education method: Explanation, Demonstration, Tactile cues, Verbal cues, and Handouts Education comprehension: verbalized understanding, returned demonstration, verbal cues required, tactile cues required, and needs further education  HOME EXERCISE PROGRAM: 01/07/24: Tendon Gliding exercises  GOALS: Goals reviewed with patient? Yes    SHORT TERM GOALS: Target date: 01/28/24   Patient will demonstrate initial BUE HEP with 25% verbal cues or less for proper execution. Baseline: New to outpt OT Goal status: IN Progress - Tendon Gliding Exc issued at eval.   2.  Pt to trial prefab hand splints and/or low-cost nighttime option to help improve pain and improve overall comfort for functional use of BUE. Recommend prefab vs custom fab due to adjustability, comfort (vs thermoplastic at night), and ease of cleaning. Baseline: Low cost option demonstrated at eval and online options shown to pt Goal status: IN Progress   3.  Pt will independently recall at least 3 joint protection, ergonomics, and body mechanic principles as noted in pt instructions to assist with daily tasks with increased comfort and confidence.  Baseline:  New to outpt OT Goal status: IN Progress   4.  Patient will demonstrate at least 30 lb grip strength x 3 trials in BUE grip strength as needed to open jars and other containers. Baseline: Grip strength - inconsistent ie) Rt: 17.4, 38.1, 19.4 lbs; Lt: 41.4, 20.9, 20.5 lbs;  Average: Right 25.0 lbs; Left 27.6 lbs Goal status: INITIAL   5.  Pt will independently recall sleep positioning options as noted in pt instructions to improve sleep disturbances from moderate to minimal.  Baseline:  max difficulty per QuickDash Goal status: IN Progress     LONG TERM GOALS: Target date: 02/28/24   Patient will demonstrate updated BUE HEP with visual instruction only for proper  execution. Baseline: New to outpt OT Goal status: IN Progress - Tendon Gliding Exc issued at eval.   2.  Pt will independently recall at least 3 options for adaptive equipment to assist with joint protection, ergonomics, and body mechanic principles as noted in pt instructions for ADLs and IADLS.  Baseline:  New to outpt OT Goal status: INITIAL   3.  Pt will be independent with home based pain management  routine to potentially include gloves/splints, heat and joint protection principles for minimal pain. Baseline: 8/10 Goal status: INITIAL   4.  Patient will demonstrate at least 16% improvement with quick Dash score (reporting <50% disability or less) indicating improved functional use of affected extremity.  Baseline: QuickDash 63.6% Goal status: INITIAL   ASSESSMENT:  CLINICAL IMPRESSION: Patient is a 48 y.o. female who was seen today for occupational therapy evaluation for B wrist/hand pain with associated numbness and tingling in the fingers, suggestive of carpal tunnel syndrome per MD note. Hx includes DM and pt reports neuropathy. Patient currently presents below baseline level of function demonstrating functional deficits and impairments as noted below. Pt would benefit from skilled OT services in the outpatient setting to work on impairments as noted below to help pt return to PLOF as able.    PERFORMANCE DEFICITS: in functional skills including ADLs, IADLs, coordination, dexterity, sensation, edema, tone, ROM, strength, pain, fascial restrictions, flexibility, Fine motor control, Gross motor control, decreased knowledge of precautions, decreased knowledge of use of DME, and UE functional use, cognitive skills including problem solving, and psychosocial skills including coping strategies, environmental adaptation, and routines and behaviors.   IMPAIRMENTS: are limiting patient from ADLs, IADLs, rest and sleep, work, leisure, and social participation.   COMORBIDITIES: may have  co-morbidities  that affects occupational performance. Patient will benefit from skilled OT to address above impairments and improve overall function.  MODIFICATION OR ASSISTANCE TO COMPLETE EVALUATION: Min-Moderate modification of tasks or assist with assess necessary to complete an evaluation.  OT OCCUPATIONAL PROFILE AND HISTORY: Detailed assessment: Review of records and additional review of physical, cognitive, psychosocial history related to current functional performance.  CLINICAL DECISION MAKING: Moderate - several treatment options, min-mod task modification necessary  REHAB POTENTIAL: Good  EVALUATION COMPLEXITY: Moderate      PLAN:  OT FREQUENCY: 1-2x/week  OT DURATION: 6 weeks  PLANNED INTERVENTIONS: 97168 OT Re-evaluation, 97535 self care/ADL training, 02889 therapeutic exercise, 97530 therapeutic activity, 97112 neuromuscular re-education, 97140 manual therapy, 97035 ultrasound, 97018 paraffin, 02960 fluidotherapy, 97010 moist heat, 97010 cryotherapy, 97034 contrast bath, 97760 Orthotic Initial, 97763 Orthotic/Prosthetic subsequent, passive range of motion, energy conservation, coping strategies training, patient/family education, and DME and/or AE instructions  RECOMMENDED OTHER SERVICES: Hand specialist as PCP deems necessary, needs to scheduled f/u with PCP  CONSULTED AND AGREED WITH PLAN OF CARE: Patient  PLAN FOR NEXT SESSION:  Review tendon glides & progress HEP as tolerate  Modalities - US /fluido Edema control Education/handouts - jt protection, sleep positions  Online version of [place]  protocol Pg 360 - non-surgical carpal tunnel management  Clarita LITTIE Pride, OT 01/07/2024, 5:14 PM  For all possible CPT codes, reference the Planned Interventions line above.     Check all conditions that are expected to impact treatment: {Conditions expected to impact treatment:Diabetes mellitus, Psychological or psychiatric disorders, and Social determinants of  health   If treatment provided at initial evaluation, no treatment charged due to lack of authorization.

## 2024-01-07 NOTE — Patient Instructions (Signed)
 SABRA

## 2024-02-01 ENCOUNTER — Emergency Department (HOSPITAL_COMMUNITY)
Admission: EM | Admit: 2024-02-01 | Discharge: 2024-02-01 | Disposition: A | Discharge status: Home / Self Care | Payer: MEDICAID | Attending: Emergency Medicine | Admitting: Emergency Medicine

## 2024-02-01 ENCOUNTER — Emergency Department (HOSPITAL_COMMUNITY)
Admission: EM | Admit: 2024-02-01 | Discharge: 2024-02-01 | Discharge status: Absent without leave | Payer: MEDICAID | Source: Home / Self Care

## 2024-02-01 ENCOUNTER — Other Ambulatory Visit: Payer: Self-pay

## 2024-02-01 ENCOUNTER — Encounter (HOSPITAL_COMMUNITY): Payer: Self-pay

## 2024-02-01 ENCOUNTER — Emergency Department (HOSPITAL_COMMUNITY): Payer: MEDICAID

## 2024-02-01 DIAGNOSIS — Z7984 Long term (current) use of oral hypoglycemic drugs: Secondary | ICD-10-CM | POA: Insufficient documentation

## 2024-02-01 DIAGNOSIS — R079 Chest pain, unspecified: Secondary | ICD-10-CM | POA: Diagnosis present

## 2024-02-01 DIAGNOSIS — E1165 Type 2 diabetes mellitus with hyperglycemia: Secondary | ICD-10-CM | POA: Diagnosis not present

## 2024-02-01 DIAGNOSIS — D72829 Elevated white blood cell count, unspecified: Secondary | ICD-10-CM | POA: Insufficient documentation

## 2024-02-01 LAB — BASIC METABOLIC PANEL WITH GFR
Anion gap: 17 — ABNORMAL HIGH (ref 5–15)
BUN: 13 mg/dL (ref 6–20)
CO2: 19 mmol/L — ABNORMAL LOW (ref 22–32)
Calcium: 9.7 mg/dL (ref 8.9–10.3)
Chloride: 97 mmol/L — ABNORMAL LOW (ref 98–111)
Creatinine, Ser: 0.64 mg/dL (ref 0.44–1.00)
GFR, Estimated: >60 mL/min
Glucose, Bld: 287 mg/dL — ABNORMAL HIGH (ref 70–99)
Potassium: 4.1 mmol/L (ref 3.5–5.1)
Sodium: 133 mmol/L — ABNORMAL LOW (ref 135–145)

## 2024-02-01 LAB — CBC WITH DIFFERENTIAL/PLATELET
Abs Immature Granulocytes: 0.04 K/uL (ref 0.00–0.07)
Basophils Absolute: 0.1 K/uL (ref 0.0–0.1)
Basophils Relative: 1 %
Eosinophils Absolute: 0.1 K/uL (ref 0.0–0.5)
Eosinophils Relative: 1 %
HCT: 37.4 % (ref 36.0–46.0)
Hemoglobin: 13.3 g/dL (ref 12.0–15.0)
Immature Granulocytes: 0 %
Lymphocytes Relative: 37 %
Lymphs Abs: 4.6 K/uL — ABNORMAL HIGH (ref 0.7–4.0)
MCH: 33.9 pg (ref 26.0–34.0)
MCHC: 35.6 g/dL (ref 30.0–36.0)
MCV: 95.4 fL (ref 80.0–100.0)
Monocytes Absolute: 0.6 K/uL (ref 0.1–1.0)
Monocytes Relative: 5 %
Neutro Abs: 7.2 K/uL (ref 1.7–7.7)
Neutrophils Relative %: 56 %
Platelets: 268 K/uL (ref 150–400)
RBC: 3.92 MIL/uL (ref 3.87–5.11)
RDW: 11.7 % (ref 11.5–15.5)
WBC: 12.6 K/uL — ABNORMAL HIGH (ref 4.0–10.5)
nRBC: 0 % (ref 0.0–0.2)

## 2024-02-01 LAB — CBG MONITORING, ED: Glucose-Capillary: 251 mg/dL — ABNORMAL HIGH (ref 70–99)

## 2024-02-01 LAB — TROPONIN T, HIGH SENSITIVITY
Troponin T High Sensitivity: <15 ng/L (ref 0–19)
Troponin T High Sensitivity: 18 ng/L (ref 0–19)

## 2024-02-01 MED ORDER — ACETAMINOPHEN 325 MG PO TABS
650.0000 mg | ORAL_TABLET | Freq: Once | ORAL | Status: AC
  Administered 2024-02-01: 650 mg via ORAL
  Filled 2024-02-01: qty 2

## 2024-02-01 MED ORDER — ALUM & MAG HYDROXIDE-SIMETH 200-200-20 MG/5ML PO SUSP
30.0000 mL | Freq: Once | ORAL | Status: AC
  Administered 2024-02-01: 30 mL via ORAL
  Filled 2024-02-01: qty 30
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
## 2024-02-01 NOTE — Discharge Instructions (Signed)
 Your cardiac work-up today was reassuring. Recommend to follow-up with your primary care doctor. Return here for new concerns.
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
## 2024-02-01 NOTE — ED Notes (Signed)
Pt requested CBG check.  
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
## 2024-02-24 ENCOUNTER — Telehealth: Payer: Self-pay

## 2024-02-24 NOTE — Telephone Encounter (Signed)
 Cmn received from [employer],will fax

## 2024-03-02 ENCOUNTER — Encounter: Payer: Self-pay | Admitting: Obstetrics

## 2024-03-04 ENCOUNTER — Ambulatory Visit: Payer: MEDICAID | Admitting: Podiatry
